# Patient Record
Sex: Female | Born: 1970 | Race: Black or African American | Hispanic: No | Marital: Single | Smoking: Never smoker
Health system: Southern US, Community
[De-identification: ages and names within clinical notes are randomized; demographics above are authoritative.]

## PROBLEM LIST (undated history)

## (undated) DIAGNOSIS — G4730 Sleep apnea, unspecified: Secondary | ICD-10-CM

## (undated) DIAGNOSIS — T7840XA Allergy, unspecified, initial encounter: Secondary | ICD-10-CM

## (undated) DIAGNOSIS — G43909 Migraine, unspecified, not intractable, without status migrainosus: Secondary | ICD-10-CM

## (undated) DIAGNOSIS — R112 Nausea with vomiting, unspecified: Secondary | ICD-10-CM

## (undated) DIAGNOSIS — N289 Disorder of kidney and ureter, unspecified: Secondary | ICD-10-CM

## (undated) DIAGNOSIS — I639 Cerebral infarction, unspecified: Secondary | ICD-10-CM

## (undated) DIAGNOSIS — D649 Anemia, unspecified: Secondary | ICD-10-CM

## (undated) DIAGNOSIS — Z9889 Other specified postprocedural states: Secondary | ICD-10-CM

## (undated) DIAGNOSIS — F419 Anxiety disorder, unspecified: Secondary | ICD-10-CM

## (undated) DIAGNOSIS — I10 Essential (primary) hypertension: Secondary | ICD-10-CM

## (undated) HISTORY — DX: Anxiety disorder, unspecified: F41.9

## (undated) HISTORY — DX: Allergy, unspecified, initial encounter: T78.40XA

## (undated) HISTORY — DX: Cerebral infarction, unspecified: I63.9

## (undated) HISTORY — PX: BREAST REDUCTION SURGERY: SHX8

## (undated) HISTORY — PX: OTHER SURGICAL HISTORY: SHX169

---

## 1996-05-15 HISTORY — PX: REDUCTION MAMMAPLASTY: SUR839

## 2008-01-10 ENCOUNTER — Ambulatory Visit: Payer: Self-pay

## 2008-01-13 ENCOUNTER — Ambulatory Visit: Payer: Self-pay

## 2009-06-09 ENCOUNTER — Emergency Department: Payer: Self-pay | Admitting: Emergency Medicine

## 2011-07-18 ENCOUNTER — Ambulatory Visit: Payer: Self-pay | Admitting: Family Medicine

## 2012-05-24 ENCOUNTER — Ambulatory Visit: Payer: Self-pay | Admitting: Family Medicine

## 2013-01-17 ENCOUNTER — Ambulatory Visit: Payer: Self-pay | Admitting: Family Medicine

## 2014-02-06 LAB — LIPID PANEL
CHOLESTEROL: 149 mg/dL (ref 0–200)
HDL: 40 mg/dL (ref 35–70)
LDL CALC: 91 mg/dL
Triglycerides: 92 mg/dL (ref 40–160)

## 2014-02-06 LAB — TSH: TSH: 3 u[IU]/mL (ref <=5.90)

## 2014-03-06 ENCOUNTER — Emergency Department (HOSPITAL_COMMUNITY): Payer: BC Managed Care – PPO

## 2014-03-06 ENCOUNTER — Encounter (HOSPITAL_COMMUNITY): Payer: Self-pay | Admitting: Emergency Medicine

## 2014-03-06 ENCOUNTER — Emergency Department (HOSPITAL_COMMUNITY)
Admission: EM | Admit: 2014-03-06 | Discharge: 2014-03-06 | Disposition: A | Discharge status: Home / Self Care | Payer: BC Managed Care – PPO | Attending: Emergency Medicine | Admitting: Emergency Medicine

## 2014-03-06 DIAGNOSIS — Z3202 Encounter for pregnancy test, result negative: Secondary | ICD-10-CM | POA: Insufficient documentation

## 2014-03-06 DIAGNOSIS — R1011 Right upper quadrant pain: Secondary | ICD-10-CM | POA: Insufficient documentation

## 2014-03-06 DIAGNOSIS — I10 Essential (primary) hypertension: Secondary | ICD-10-CM | POA: Insufficient documentation

## 2014-03-06 DIAGNOSIS — G8918 Other acute postprocedural pain: Secondary | ICD-10-CM | POA: Insufficient documentation

## 2014-03-06 DIAGNOSIS — M549 Dorsalgia, unspecified: Secondary | ICD-10-CM | POA: Insufficient documentation

## 2014-03-06 DIAGNOSIS — R109 Unspecified abdominal pain: Secondary | ICD-10-CM

## 2014-03-06 DIAGNOSIS — Z87448 Personal history of other diseases of urinary system: Secondary | ICD-10-CM | POA: Insufficient documentation

## 2014-03-06 HISTORY — DX: Migraine, unspecified, not intractable, without status migrainosus: G43.909

## 2014-03-06 HISTORY — DX: Disorder of kidney and ureter, unspecified: N28.9

## 2014-03-06 HISTORY — DX: Essential (primary) hypertension: I10

## 2014-03-06 LAB — URINALYSIS, ROUTINE W REFLEX MICROSCOPIC
Bilirubin Urine: NEGATIVE
GLUCOSE, UA: NEGATIVE mg/dL
Ketones, ur: NEGATIVE mg/dL
LEUKOCYTES UA: NEGATIVE
Nitrite: NEGATIVE
Protein, ur: >300 mg/dL — AB
Specific Gravity, Urine: 1.012 (ref 1.005–1.030)
Urobilinogen, UA: 0.2 mg/dL (ref 0.0–1.0)
pH: 5.5 (ref 5.0–8.0)

## 2014-03-06 LAB — COMPREHENSIVE METABOLIC PANEL
ALT: 8 U/L (ref 0–35)
ANION GAP: 14 (ref 5–15)
AST: 13 U/L (ref 0–37)
Albumin: 2.6 g/dL — ABNORMAL LOW (ref 3.5–5.2)
Alkaline Phosphatase: 112 U/L (ref 39–117)
BUN: 45 mg/dL — AB (ref 6–23)
CALCIUM: 8.6 mg/dL (ref 8.4–10.5)
CO2: 20 mEq/L (ref 19–32)
CREATININE: 4.35 mg/dL — AB (ref 0.50–1.10)
Chloride: 103 mEq/L (ref 96–112)
GFR, EST AFRICAN AMERICAN: 13 mL/min — AB (ref >90)
GFR, EST NON AFRICAN AMERICAN: 12 mL/min — AB (ref >90)
GLUCOSE: 96 mg/dL (ref 70–99)
Potassium: 3.9 mEq/L (ref 3.7–5.3)
Sodium: 137 mEq/L (ref 137–147)
TOTAL PROTEIN: 7.5 g/dL (ref 6.0–8.3)
Total Bilirubin: <0.2 mg/dL — ABNORMAL LOW (ref 0.3–1.2)

## 2014-03-06 LAB — I-STAT CG4 LACTIC ACID, ED: Lactic Acid, Venous: 0.32 mmol/L — ABNORMAL LOW (ref 0.5–2.2)

## 2014-03-06 LAB — CBC WITH DIFFERENTIAL/PLATELET
Basophils Absolute: 0 10*3/uL (ref 0.0–0.1)
Basophils Relative: 0 % (ref 0–1)
EOS ABS: 0.4 10*3/uL (ref 0.0–0.7)
Eosinophils Relative: 4 % (ref 0–5)
HCT: 23.6 % — ABNORMAL LOW (ref 36.0–46.0)
HEMOGLOBIN: 8 g/dL — AB (ref 12.0–15.0)
LYMPHS ABS: 2.3 10*3/uL (ref 0.7–4.0)
Lymphocytes Relative: 24 % (ref 12–46)
MCH: 32.8 pg (ref 26.0–34.0)
MCHC: 33.9 g/dL (ref 30.0–36.0)
MCV: 96.7 fL (ref 78.0–100.0)
MONO ABS: 0.4 10*3/uL (ref 0.1–1.0)
MONOS PCT: 4 % (ref 3–12)
Neutro Abs: 6.7 10*3/uL (ref 1.7–7.7)
Neutrophils Relative %: 68 % (ref 43–77)
PLATELETS: 364 10*3/uL (ref 150–400)
RBC: 2.44 MIL/uL — ABNORMAL LOW (ref 3.87–5.11)
RDW: 11.5 % (ref 11.5–15.5)
WBC: 9.8 10*3/uL (ref 4.0–10.5)

## 2014-03-06 LAB — I-STAT CHEM 8, ED
BUN: 42 mg/dL — AB (ref 6–23)
Calcium, Ion: 1.06 mmol/L — ABNORMAL LOW (ref 1.12–1.23)
Chloride: 107 mEq/L (ref 96–112)
Creatinine, Ser: 4.5 mg/dL — ABNORMAL HIGH (ref 0.50–1.10)
Glucose, Bld: 99 mg/dL (ref 70–99)
HEMATOCRIT: 27 % — AB (ref 36.0–46.0)
Hemoglobin: 9.2 g/dL — ABNORMAL LOW (ref 12.0–15.0)
Potassium: 3.7 mEq/L (ref 3.7–5.3)
Sodium: 137 mEq/L (ref 137–147)
TCO2: 18 mmol/L (ref 0–100)

## 2014-03-06 LAB — PREGNANCY, URINE: Preg Test, Ur: NEGATIVE

## 2014-03-06 LAB — LIPASE, BLOOD: Lipase: 29 U/L (ref 11–59)

## 2014-03-06 LAB — URINE MICROSCOPIC-ADD ON

## 2014-03-06 MED ORDER — TRAMADOL HCL 50 MG PO TABS: 50.0000 mg | ORAL_TABLET | Freq: Four times a day (QID) | ORAL | Status: DC | PRN

## 2014-03-06 MED ORDER — HYDROMORPHONE HCL 1 MG/ML IJ SOLN
0.5000 mg | Freq: Once | INTRAMUSCULAR | Status: AC
  Administered 2014-03-06: 0.5 mg via INTRAVENOUS
  Filled 2014-03-06: qty 1

## 2014-03-06 MED ORDER — VALACYCLOVIR HCL 500 MG PO TABS: 500.0000 mg | ORAL_TABLET | Freq: Two times a day (BID) | ORAL | Status: DC

## 2014-03-06 NOTE — ED Notes (Signed)
Pt reports having right kidney biopsy on 02/17/14 and has had right flank pain since procedure. States pain is a sharp, jabbing, spasm pain that has now radiated to her abdomen. States pain is 4 out 10 when moving, 6 out 10 when twisting and on inspiration, and 8/10 when she wakes up in the am. Pt denies recent injury, twisting, pulling side.

## 2014-03-06 NOTE — ED Provider Notes (Signed)
CSN: [id number]     Arrival date & time 03/06/14  1804 History   First MD Initiated Contact with Patient 03/06/14 1840     Chief Complaint  Patient presents with  . Flank Pain    Patient is a 43 y.o. female presenting with abdominal pain. The history is provided by the patient.  Abdominal Pain Pain location:  L flank Pain quality: aching, sharp and squeezing   Pain radiates to:  RUQ Pain severity:  Severe Onset quality:  Gradual Duration:  2 weeks Timing:  Constant Progression:  Worsening Chronicity:  New Context: previous surgery (recent renal biopsy)   Relieved by:  Nothing Worsened by:  Movement Ineffective treatments: narcotics. Associated symptoms: nausea   Associated symptoms: no chest pain, no cough, no diarrhea, no dysuria, no fever, no vaginal bleeding, no vaginal discharge and no vomiting   Nausea:    Severity:  Mild   Onset quality:  Gradual   Duration:  2 weeks   Timing:  Intermittent   Progression:  Waxing and waning Risk factors: multiple surgeries and obesity     Past Medical History  Diagnosis Date  . Migraine   . Hypertension   . Renal disorder    History reviewed. No pertinent past surgical history. History reviewed. No pertinent family history. History  Substance Use Topics  . Smoking status: Never Smoker   . Smokeless tobacco: Not on file  . Alcohol Use: Not on file   OB History   Grav Para Term Preterm Abortions TAB SAB Ect Mult Living                 Review of Systems  Constitutional: Negative for fever.  Respiratory: Negative for cough.   Cardiovascular: Negative for chest pain.  Gastrointestinal: Positive for nausea and abdominal pain. Negative for vomiting and diarrhea.  Genitourinary: Positive for flank pain. Negative for dysuria, vaginal bleeding, vaginal discharge and difficulty urinating.  Musculoskeletal: Positive for back pain.  Skin: Negative for rash.  All other systems reviewed and are negative.     Allergies  Dye  fdc red; Nsaids; and Ace inhibitors  Home Medications   Prior to Admission medications   Medication Sig Start Date End Date Taking? Authorizing Provider  oxyCODONE-acetaminophen (PERCOCET/ROXICET) 5-325 MG per tablet Take 1 tablet by mouth every 6 (six) hours as needed. 02/17/14   Historical Provider, MD   BP 139/90  Pulse 87  Temp(Src) 97.5 F (36.4 C) (Oral)  Resp 18  Wt 198 lb 2 oz (89.869 kg)  SpO2 100%  LMP 02/17/2014 Physical Exam  Constitutional: She is oriented to person, place, and time. She appears well-developed and well-nourished. She is cooperative. No distress.  Has pain in her right flank with movement  HENT:  Head: Normocephalic and atraumatic.  Right Ear: External ear normal.  Left Ear: External ear normal.  Neck: Normal range of motion and phonation normal.  Cardiovascular: Normal rate and regular rhythm.   Pulmonary/Chest: Effort normal and breath sounds normal. No respiratory distress. She has no wheezes. She has no rales.  Abdominal: Soft. She exhibits no distension. There is no tenderness. There is no rebound and no guarding.  Neurological: She is alert and oriented to person, place, and time. GCS eye subscore is 4. GCS verbal subscore is 5. GCS motor subscore is 6.  Skin: Skin is warm and dry. No rash noted. She is not diaphoretic.  Psychiatric: She has a normal mood and affect. Her speech is normal.    ED  Course  Procedures (including critical care time) Labs Review  Results for orders placed during the hospital encounter of 03/06/14  URINALYSIS, ROUTINE W REFLEX MICROSCOPIC      Result Value Ref Range   Color, Urine YELLOW  YELLOW   APPearance CLEAR  CLEAR   Specific Gravity, Urine 1.012  1.005 - 1.030   pH 5.5  5.0 - 8.0   Glucose, UA NEGATIVE  NEGATIVE mg/dL   Hgb urine dipstick TRACE (*) NEGATIVE   Bilirubin Urine NEGATIVE  NEGATIVE   Ketones, ur NEGATIVE  NEGATIVE mg/dL   Protein, ur >161 (*) NEGATIVE mg/dL   Urobilinogen, UA 0.2  0.0 - 1.0  mg/dL   Nitrite NEGATIVE  NEGATIVE   Leukocytes, UA NEGATIVE  NEGATIVE  PREGNANCY, URINE      Result Value Ref Range   Preg Test, Ur NEGATIVE  NEGATIVE  LIPASE, BLOOD      Result Value Ref Range   Lipase 29  11 - 59 U/L  CBC WITH DIFFERENTIAL      Result Value Ref Range   WBC 9.8  4.0 - 10.5 K/uL   RBC 2.44 (*) 3.87 - 5.11 MIL/uL   Hemoglobin 8.0 (*) 12.0 - 15.0 g/dL   HCT 09.6 (*) 04.5 - 40.9 %   MCV 96.7  78.0 - 100.0 fL   MCH 32.8  26.0 - 34.0 pg   MCHC 33.9  30.0 - 36.0 g/dL   RDW 81.1  91.4 - 78.2 %   Platelets 364  150 - 400 K/uL   Neutrophils Relative % 68  43 - 77 %   Neutro Abs 6.7  1.7 - 7.7 K/uL   Lymphocytes Relative 24  12 - 46 %   Lymphs Abs 2.3  0.7 - 4.0 K/uL   Monocytes Relative 4  3 - 12 %   Monocytes Absolute 0.4  0.1 - 1.0 K/uL   Eosinophils Relative 4  0 - 5 %   Eosinophils Absolute 0.4  0.0 - 0.7 K/uL   Basophils Relative 0  0 - 1 %   Basophils Absolute 0.0  0.0 - 0.1 K/uL  COMPREHENSIVE METABOLIC PANEL      Result Value Ref Range   Sodium 137  137 - 147 mEq/L   Potassium 3.9  3.7 - 5.3 mEq/L   Chloride 103  96 - 112 mEq/L   CO2 20  19 - 32 mEq/L   Glucose, Bld 96  70 - 99 mg/dL   BUN 45 (*) 6 - 23 mg/dL   Creatinine, Ser 9.56 (*) 0.50 - 1.10 mg/dL   Calcium 8.6  8.4 - 21.3 mg/dL   Total Protein 7.5  6.0 - 8.3 g/dL   Albumin 2.6 (*) 3.5 - 5.2 g/dL   AST 13  0 - 37 U/L   ALT 8  0 - 35 U/L   Alkaline Phosphatase 112  39 - 117 U/L   Total Bilirubin <0.2 (*) 0.3 - 1.2 mg/dL   GFR calc non Af Amer 12 (*) >90 mL/min   GFR calc Af Amer 13 (*) >90 mL/min   Anion gap 14  5 - 15  URINE MICROSCOPIC-ADD ON      Result Value Ref Range   Squamous Epithelial / LPF FEW (*) RARE   WBC, UA 3-6  <3 WBC/hpf   RBC / HPF 0-2  <3 RBC/hpf   Bacteria, UA FEW (*) RARE  I-STAT CHEM 8, ED      Result Value Ref Range  Sodium 137  137 - 147 mEq/L   Potassium 3.7  3.7 - 5.3 mEq/L   Chloride 107  96 - 112 mEq/L   BUN 42 (*) 6 - 23 mg/dL   Creatinine, Ser 4.094.50 (*)  0.50 - 1.10 mg/dL   Glucose, Bld 99  70 - 99 mg/dL   Calcium, Ion 8.111.06 (*) 1.12 - 1.23 mmol/L   TCO2 18  0 - 100 mmol/L   Hemoglobin 9.2 (*) 12.0 - 15.0 g/dL   HCT 91.427.0 (*) 78.236.0 - 95.646.0 %  I-STAT CG4 LACTIC ACID, ED      Result Value Ref Range   Lactic Acid, Venous 0.32 (*) 0.5 - 2.2 mmol/L     Imaging Review Ct Abdomen Pelvis Wo Contrast  03/06/2014   CLINICAL DATA:  43 year old female with history of right renal biopsy on 02/17/2014 complaining of right-sided abdominal pain and nausea since that time.  EXAM: CT ABDOMEN AND PELVIS WITHOUT CONTRAST  TECHNIQUE: Multidetector CT imaging of the abdomen and pelvis was performed following the standard protocol without IV contrast.  COMPARISON:  No priors.  FINDINGS: Lower chest: Small right pleural effusion layering dependently is simple in appearance. Minimal dependent atelectasis in the right lower lobe.  Hepatobiliary: Small area of low attenuation adjacent to the inferior aspect of falciform ligament, compatible with focal fatty infiltration. No other focal hepatic lesions are noted. Gallbladder is nearly completely contracted, but otherwise unremarkable in appearance.  Pancreas: Unremarkable.  Spleen: Unremarkable.  Adrenals/Urinary Tract: There is irregularity and intermediate attenuation soft tissue associated with the posterior aspect of the may an upper right kidney, as well as in the posterolateral aspect of the right retroperitoneum, presumably resolving post biopsy fluid collections. This is not frankly bloody at this time, and does not appear to represent a significant volume of subcapsular hemorrhage. Left kidney is unremarkable. No hydroureteronephrosis to suggest urinary tract obstruction at this time. No abnormal urinary tract calculi. Urinary bladder is unremarkable in appearance.  Stomach/Bowel: Normal appearance of the stomach. No pathologic dilatation of the small bowel or colon. Normal appendix.  Vascular/Lymphatic: No significant  atherosclerotic calcifications within the abdominal and pelvic vasculature. No aneurysm. Multiple prominent borderline enlarged retroperitoneal lymph nodes are noted, but are nonspecific. No definite pathologically enlarged lymph nodes are noted in the abdomen or pelvis on today's non contrast CT examination.  Reproductive: Uterus and ovaries are unremarkable in appearance.  Other: Trace volume of free fluid in the cul-de-sac, likely physiologic in this young female patient. No pneumoperitoneum. Tiny umbilical hernia containing only omental fat incidentally noted.  Musculoskeletal: There are no aggressive appearing lytic or blastic lesions noted in the visualized portions of the skeleton.  IMPRESSION: 1. There is mild irregularity of the posterior aspect of the right kidney and in the overlying retroperitoneal soft tissues, presumably resolving post biopsy fluid collections. This is not frankly hemorrhagic at this time, and there does not appear to be a significant sized subcapsular hematoma at this time on today's noncontrast CT examination. 2. There is a small right pleural effusion which is simple in appearance layering dependently with a small amount of dependent atelectasis in the right lower lobe. 3. Normal appendix. 4. Additional incidental findings, as above.   Electronically Signed   By: Trudie Reedaniel  Entrikin M.D.   On: 03/06/2014 20:10   Dg Chest 2 View  03/06/2014   CLINICAL DATA:  Right kidney biopsy 02/17/2014. Right posterior side pain for 8 9 days. History of hypertension.  EXAM: CHEST  2 VIEW  COMPARISON:  None.  FINDINGS: The heart size and mediastinal contours are within normal limits. Both lungs are clear. The visualized skeletal structures are unremarkable.  IMPRESSION: No active cardiopulmonary disease.   Electronically Signed   By: Charlett Nose M.D.   On: 03/06/2014 19:57     MDM   Final diagnoses:  Right flank pain  Right sided abdominal pain    43 year old female who presents to the  ED with right flank pain. She had a renal biopsy about 2 weeks ago and has pain since then. She feels she is getting progressively worse. Associated with nausea. No fever, chills, chest pain, difficulty breathing, dysuria, change in urination, vaginal bleeding, vaginal discharge.   Exam as above. Has pain with movement of her torso. Pain is not reproducible.   Concern for post biopsy hematoma, infection, or other complication. Considered pulmonary source of her pain due to the location of right flank. She denies any shortness of breath, however does have right flank pain that is worse when she takes a deep breath.  Labs were obtained she has a lactate that is not elevated. CBC shows a hemoglobin of 8. The patient reports that she is to start erythropoietin injections this week due to chronic anemia secondary to her chronic kidney disease. CMP notable for an elevated BUN and creatinine consistent with her chronic kidney disease. Lipase within normal limits. Urinalysis shows no sign of infection. UPT negative.  CT abdomen and pelvis without contrast showed mild irregularity of the posterior aspect of the right kidney in the overlying retroperitoneal soft tissues presumably resolving post biopsy fluid collections. No frank hemorrhage and no significant subcapsular hematoma. Small right pleural effusion.  The attending physician discussed at length with the patient the possibility of doing a VQ scan as an outpatient. Her right flank pain is very atypical for a pulmonary embolus as she has pain with movement. She was given strict return precautions with an emphasis on signs and symptoms of pulmonary embolus. She was given return precautions and writing as well. Based on shared decision-making with the patient, she was discharged home and will return immediately should she develop any new or worsening symptoms. She is to followup with her primary care physician.  This case managed in conjunction with my  attending, Dr. Rhunette Croft.   Maxine Glenn, MD 03/07/14 [phone]

## 2014-03-06 NOTE — ED Notes (Signed)
Pt in c/o right flank pain for the last two weeks, on 10/6 pt had a biopsy on her right kidney and symptoms started a few days- a week after that, pain has been getting progressively worse, also nausea

## 2014-03-06 NOTE — Discharge Instructions (Signed)
Abdominal Pain, Women °Abdominal (stomach, pelvic, or belly) pain can be caused by many things. It is important to tell your doctor: °· The location of the pain. °· Does it come and go or is it present all the time? °· Are there things that start the pain (eating certain foods, exercise)? °· Are there other symptoms associated with the pain (fever, nausea, vomiting, diarrhea)? °All of this is helpful to know when trying to find the cause of the pain. °CAUSES  °· Stomach: virus or bacteria infection, or ulcer. °· Intestine: appendicitis (inflamed appendix), regional ileitis (Crohn's disease), ulcerative colitis (inflamed colon), irritable bowel syndrome, diverticulitis (inflamed diverticulum of the colon), or cancer of the stomach or intestine. °· Gallbladder disease or stones in the gallbladder. °· Kidney disease, kidney stones, or infection. °· Pancreas infection or cancer. °· Fibromyalgia (pain disorder). °· Diseases of the female organs: °¨ Uterus: fibroid (non-cancerous) tumors or infection. °¨ Fallopian tubes: infection or tubal pregnancy. °¨ Ovary: cysts or tumors. °¨ Pelvic adhesions (scar tissue). °¨ Endometriosis (uterus lining tissue growing in the pelvis and on the pelvic organs). °¨ Pelvic congestion syndrome (female organs filling up with blood just before the menstrual period). °¨ Pain with the menstrual period. °¨ Pain with ovulation (producing an egg). °¨ Pain with an IUD (intrauterine device, birth control) in the uterus. °¨ Cancer of the female organs. °· Functional pain (pain not caused by a disease, may improve without treatment). °· Psychological pain. °· Depression. °DIAGNOSIS  °Your doctor will decide the seriousness of your pain by doing an examination. °· Blood tests. °· X-rays. °· Ultrasound. °· CT scan (computed tomography, special type of X-ray). °· MRI (magnetic resonance imaging). °· Cultures, for infection. °· Barium enema (dye inserted in the large intestine, to better view it with  X-rays). °· Colonoscopy (looking in intestine with a lighted tube). °· Laparoscopy (minor surgery, looking in abdomen with a lighted tube). °· Major abdominal exploratory surgery (looking in abdomen with a large incision). °TREATMENT  °The treatment will depend on the cause of the pain.  °· Many cases can be observed and treated at home. °· Over-the-counter medicines recommended by your caregiver. °· Prescription medicine. °· Antibiotics, for infection. °· Birth control pills, for painful periods or for ovulation pain. °· Hormone treatment, for endometriosis. °· Nerve blocking injections. °· Physical therapy. °· Antidepressants. °· Counseling with a psychologist or psychiatrist. °· Minor or major surgery. °HOME CARE INSTRUCTIONS  °· Do not take laxatives, unless directed by your caregiver. °· Take over-the-counter pain medicine only if ordered by your caregiver. Do not take aspirin because it can cause an upset stomach or bleeding. °· Try a clear liquid diet (broth or water) as ordered by your caregiver. Slowly move to a bland diet, as tolerated, if the pain is related to the stomach or intestine. °· Have a thermometer and take your temperature several times a day, and record it. °· Bed rest and sleep, if it helps the pain. °· Avoid sexual intercourse, if it causes pain. °· Avoid stressful situations. °· Keep your follow-up appointments and tests, as your caregiver orders. °· If the pain does not go away with medicine or surgery, you may try: °¨ Acupuncture. °¨ Relaxation exercises (yoga, meditation). °¨ Group therapy. °¨ Counseling. °SEEK MEDICAL CARE IF:  °· You notice certain foods cause stomach pain. °· Your home care treatment is not helping your pain. °· You need stronger pain medicine. °· You want your IUD removed. °· You feel faint or   lightheaded.  You develop nausea and vomiting.  You develop a rash.  You are having side effects or an allergy to your medicine. SEEK IMMEDIATE MEDICAL CARE IF:   Your  pain does not go away or gets worse.  You have a fever.  Your pain is felt only in portions of the abdomen. The right side could possibly be appendicitis. The left lower portion of the abdomen could be colitis or diverticulitis.  You are passing blood in your stools (bright red or black tarry stools, with or without vomiting).  You have blood in your urine.  You develop chills, with or without a fever.  You pass out. MAKE SURE YOU:   Understand these instructions.  Will watch your condition.  Will get help right away if you are not doing well or get worse. Document Released: 02/26/2007 Document Revised: 09/15/2013 Document Reviewed: 03/18/2009 [facility] Patient Information 2015 [place], [place]. This information is not intended to replace advice given to you by your health care provider. Make sure you discuss any questions you have with your health care provider.  Pulmonary Embolism A pulmonary (lung) embolism (PE) is a blood clot that has traveled to the lung and results in a blockage of blood flow in the affected lung. Most clots come from deep veins in the legs or pelvis. PE is a dangerous and potentially life-threatening condition that can be treated if identified. CAUSES Blood clots form in a vein for different reasons. Usually several things cause blood clots. They include:  The flow of blood slows down.  The inside of the vein is damaged in some way.  The person has a condition that makes the blood clot more easily. RISK FACTORS Some people are more likely than others to develop PE. Risk factors include:   Smoking.  Being overweight (obese).  Sitting or lying still for a long time. This includes long-distance travel, paralysis, or recovery from an illness or surgery. Other factors that increase risk are:   Older age, especially over 42 years of age.  Having a family history of blood clots or if you have already had a blood clot.  Having major or lengthy surgery.  This is especially true for surgery on the hip, knee, or belly (abdomen). Hip surgery is particularly high risk.  Having a long, thin tube (catheter) placed inside a vein during a medical procedure.  Breaking a hip or leg.  Having cancer or cancer treatment.  Medicines containing the female hormone estrogen. This includes birth control pills and hormone replacement therapy.  Other circulation or heart problems.  Pregnancy and childbirth.  Hormone changes make the blood clot more easily during pregnancy.  The fetus puts pressure on the veins of the pelvis.  There is a risk of injury to veins during delivery or a caesarean delivery. The risk is highest just after childbirth.  PREVENTION   Exercise the legs regularly. Take a brisk 30 minute walk every day.  Maintain a weight that is appropriate for your height.  Avoid sitting or lying in bed for long periods of time without moving your legs.  Women, particularly those over the age of 35 years, should consider the risks and benefits of taking estrogen medicines, including birth control pills.  Do not smoke, especially if you take estrogen medicines.  Long-distance travel can increase your risk. You should exercise your legs by walking or pumping the muscles every hour.  Many of the risk factors above relate to situations that exist with hospitalization, either  for illness, injury, or elective surgery. Prevention may include medical and nonmedical measures.   Your health care provider will assess you for the need for venous thromboembolism prevention when you are admitted to the hospital. If you are having surgery, your surgeon will assess you the day of or day after surgery.  SYMPTOMS  The symptoms of a PE usually start suddenly and include:  Shortness of breath.  Coughing.  Coughing up blood or blood-tinged mucus.  Chest pain. Pain is often worse with deep breaths.  Rapid heartbeat. DIAGNOSIS  If a PE is suspected,  your health care provider will take a medical history and perform a physical exam. Other tests that may be required include:  Blood tests, such as studies of the clotting properties of your blood.  Imaging tests, such as ultrasound, CT, MRI, and other tests to see if you have clots in your legs or lungs.  An electrocardiogram. This can look for heart strain from blood clots in the lungs. TREATMENT   The most common treatment for a PE is blood thinning (anticoagulant) medicine, which reduces the blood's tendency to clot. Anticoagulants can stop new blood clots from forming and old clots from growing. They cannot dissolve existing clots. Your body does this by itself over time. Anticoagulants can be given by mouth, through an intravenous (IV) tube, or by injection. Your health care provider will determine the best program for you.  Less commonly, clot-dissolving medicines (thrombolytics) are used to dissolve a PE. They carry a high risk of bleeding, so they are used mainly in severe cases.  Very rarely, a blood clot in the leg needs to be removed surgically.  If you are unable to take anticoagulants, your health care provider may arrange for you to have a filter placed in a main vein in your abdomen. This filter prevents clots from traveling to your lungs. HOME CARE INSTRUCTIONS   Take all medicines as directed by your health care provider.  Learn as much as you can about DVT.  Wear a medical alert bracelet or carry a medical alert card.  Ask your health care provider how soon you can go back to normal activities. It is important to stay active to prevent blood clots. If you are on anticoagulant medicine, avoid contact sports.  It is very important to exercise. This is especially important while traveling, sitting, or standing for long periods of time. Exercise your legs by walking or by tightening and relaxing your leg muscles regularly. Take frequent walks.  You may need to wear  compression stockings. These are tight elastic stockings that apply pressure to the lower legs. This pressure can help keep the blood in the legs from clotting. Taking Warfarin Warfarin is a daily medicine that is taken by mouth. Your health care provider will advise you on the length of treatment (usually 3-6 months, sometimes lifelong). If you take warfarin:  Understand how to take warfarin and foods that can affect how warfarin works in Public relations account executive.  Too much and too little warfarin are both dangerous. Too much warfarin increases the risk of bleeding. Too little warfarin continues to allow the risk for blood clots. Warfarin and Regular Blood Testing While taking warfarin, you will need to have regular blood tests to measure your blood clotting time. These blood tests usually include both the prothrombin time (PT) and international normalized ratio (INR) tests. The PT and INR results allow your health care provider to adjust your dose of warfarin. It is very important  that you have your PT and INR tested as often as directed by your health care provider.  Warfarin and Your Diet Avoid major changes in your diet, or notify your health care provider before changing your diet. Arrange a visit with a registered dietitian to answer your questions. Many foods, especially foods high in vitamin K, can interfere with warfarin and affect the PT and INR results. You should eat a consistent amount of foods high in vitamin K. Foods high in vitamin K include:   Spinach, kale, broccoli, cabbage, collard and turnip greens, Brussels sprouts, peas, cauliflower, seaweed, and parsley.  Beef and pork liver.  Green tea.  Soybean oil. Warfarin with Other Medicines Many medicines can interfere with warfarin and affect the PT and INR results. You must:  Tell your health care provider about any and all medicines, vitamins, and supplements you take, including aspirin and other over-the-counter anti-inflammatory medicines.  Be especially cautious with aspirin and anti-inflammatory medicines. Ask your health care provider before taking these.  Do not take or discontinue any prescribed or over-the-counter medicine except on the advice of your health care provider or pharmacist. Warfarin Side Effects Warfarin can have side effects, such as easy bruising and difficulty stopping bleeding. Ask your health care provider or pharmacist about other side effects of warfarin. You will need to:  Hold pressure over cuts for longer than usual.  Notify your dentist and other health care providers that you are taking warfarin before you undergo any procedures where bleeding may occur. Warfarin with Alcohol and Tobacco   Drinking alcohol frequently can increase the effect of warfarin, leading to excess bleeding. It is best to avoid alcoholic drinks or consume only very small amounts while taking warfarin. Notify your health care provider if you change your alcohol intake.  Do not use any tobacco products including cigarettes, chewing tobacco, or electronic cigarettes. If you smoke, quit. Ask your health care provider for help with quitting smoking. Alternative Medicines to Warfarin: Factor Xa Inhibitor Medicines  These blood thinning medicines are taken by mouth, usually for several weeks or longer. It is important to take the medicine every single day, at the same time each day.  There are no regular blood tests required when using these medicines.  There are fewer food and drug interactions than with warfarin.  The side effects of this class of medicine is similar to that of warfarin, including excessive bruising or bleeding. Ask your health care provider or pharmacist about other potential side effects. SEEK MEDICAL CARE IF:   You notice a rapid heartbeat.  You feel weaker or more tired than usual.  You feel faint.  You notice increased bruising.  Your symptoms are not getting better in the time expected.  You are  having side effects of medicine. SEEK IMMEDIATE MEDICAL CARE IF:   You have chest pain.  You have trouble breathing.  You have new or increased swelling or pain in one leg.  You cough up blood.  You notice blood in vomit, in a bowel movement, or in urine.  You have a fever. Symptoms of PE may represent a serious problem that is an emergency. Do not wait to see if the symptoms will go away. Get medical help right away. Call your local emergency services (911 in the [place] Do not drive yourself to the hospital. Document Released: 04/28/2000 Document Revised: 09/15/2013 Document Reviewed: 05/12/2013 [facility] Patient Information 2015 [place], [place]. This information is not intended to replace advice given to you  by your health care provider. Make sure you discuss any questions you have with your health care provider. ° °

## 2014-03-06 NOTE — ED Provider Notes (Signed)
I performed a history and physical examination of  Maryana [name] and discussed her management with Dr. Melene MullerBatista. I agree with the history, physical, assessment, and plan of care, with the following exceptions: None I was present for the following procedures: None  Time Spent in Critical Care of the patient: None  Time spent in discussions with the patient and family: 10 min  Henrik Orihuela  Pt comes in with  Flank pain, R. S/p biopsy on 10/6. Pt's pain is worse with inspiration. No UTI like sx, no hematuria. Clinical concerns for renal hematoma, PNA, and PE is possible, but less likely. Will get CT abd non contrast (as she has CKD), and CXR to start.   1:54 AM PE discussion done. Dimer not a good test du to recent surgery. Return precautions discussed with pt, and she rather go with the wait and see approach over VQ scan.  Derwood KaplanAnkit Ausencio Vaden, MD 03/07/14 [phone]

## 2014-03-25 DIAGNOSIS — D631 Anemia in chronic kidney disease: Secondary | ICD-10-CM

## 2014-03-25 DIAGNOSIS — N189 Chronic kidney disease, unspecified: Secondary | ICD-10-CM | POA: Insufficient documentation

## 2014-06-11 LAB — CBC AND DIFFERENTIAL
HCT: 34 % — AB (ref 36–46)
HEMOGLOBIN: 11.8 g/dL — AB (ref 12.0–16.0)
Platelets: 365 10*3/uL (ref 150–399)

## 2014-09-07 ENCOUNTER — Encounter (HOSPITAL_COMMUNITY): Payer: Self-pay | Admitting: Emergency Medicine

## 2014-09-07 ENCOUNTER — Emergency Department (INDEPENDENT_AMBULATORY_CARE_PROVIDER_SITE_OTHER)
Admission: EM | Admit: 2014-09-07 | Discharge: 2014-09-07 | Disposition: A | Discharge status: Home / Self Care | Payer: BC Managed Care – PPO | Source: Home / Self Care | Attending: Family Medicine | Admitting: Family Medicine

## 2014-09-07 DIAGNOSIS — R0789 Other chest pain: Secondary | ICD-10-CM | POA: Diagnosis not present

## 2014-09-07 NOTE — Discharge Instructions (Signed)
Tylenol , ice pack as needed for pain, activity as tolerated.

## 2014-09-07 NOTE — ED Notes (Signed)
Pt states that she started to have chest pain that started today. Pt states that she felt the chest pain

## 2014-09-07 NOTE — ED Provider Notes (Signed)
CSN: [id number]     Arrival date & time 09/07/14  1641 History   First MD Initiated Contact with Patient 09/07/14 1724     Chief Complaint  Patient presents with  . Chest Pain   (Consider location/radiation/quality/duration/timing/severity/associated sxs/prior Treatment) Patient is a 44 y.o. female presenting with chest pain. The history is provided by the patient.  Chest Pain Pain location:  L chest Pain quality: sharp   Pain radiates to:  Does not radiate Pain radiates to the back: no   Pain severity:  Mild Onset quality:  Gradual Duration:  4 hours Chronicity:  New Context: movement   Relieved by:  None tried Worsened by:  Nothing tried Ineffective treatments:  None tried Associated symptoms: no anxiety, no back pain, no fever, no palpitations, no shortness of breath and no weakness   Risk factors: hypertension     Past Medical History  Diagnosis Date  . Migraine   . Hypertension   . Renal disorder    History reviewed. No pertinent past surgical history. History reviewed. No pertinent family history. History  Substance Use Topics  . Smoking status: Never Smoker   . Smokeless tobacco: Not on file  . Alcohol Use: Not on file   OB History    No data available     Review of Systems  Constitutional: Negative.  Negative for fever.  Respiratory: Negative for shortness of breath.   Cardiovascular: Positive for chest pain. Negative for palpitations and leg swelling.  Gastrointestinal: Negative.   Musculoskeletal: Negative for back pain.  Neurological: Negative for weakness.    Allergies  Dye fdc red; Nsaids; and Ace inhibitors  Home Medications   Prior to Admission medications   Medication Sig Start Date End Date Taking? Authorizing Provider  metoprolol succinate (TOPROL-XL) 50 MG 24 hr tablet Take 50 mg by mouth daily. Take with or immediately following a meal.   Yes Historical Provider, MD  oxyCODONE-acetaminophen (PERCOCET/ROXICET) 5-325 MG per tablet Take 1  tablet by mouth every 6 (six) hours as needed. 02/17/14   Historical Provider, MD  traMADol (ULTRAM) 50 MG tablet Take 1 tablet (50 mg total) by mouth every 6 (six) hours as needed for severe pain. 03/06/14   Maxine GlennAnn Batista, MD   BP 154/105 mmHg  Pulse 62  Temp(Src) 99.2 F (37.3 C) (Oral)  Resp 20  SpO2 99%  LMP 08/14/2014 Physical Exam  Constitutional: She is oriented to person, place, and time. She appears well-developed and well-nourished.  Neck: Normal range of motion. Neck supple.  Cardiovascular: Normal rate, regular rhythm, normal heart sounds and intact distal pulses.   Pulmonary/Chest: Effort normal. No respiratory distress. She has no wheezes. She has no rales. She exhibits tenderness.  Abdominal: Soft. Bowel sounds are normal.  Neurological: She is alert and oriented to person, place, and time.  Skin: Skin is warm and dry.  Nursing note and vitals reviewed.   ED Course  Procedures (including critical care time) Labs Review ecg, wnl, nsr Imaging Review No results found.   MDM   1. Acute chest wall pain        Linna HoffJames D Kindl, MD 09/07/14 [phone]

## 2014-09-07 NOTE — ED Notes (Signed)
Pt is experiencing left lower chest pain that can be recreated with pressure.  It started earlier today when she was driving.

## 2014-09-28 LAB — BASIC METABOLIC PANEL
BUN: 57 mg/dL — AB (ref 4–21)
Creatinine: 3.8 mg/dL — AB (ref 0.5–1.1)
GLUCOSE: 93 mg/dL
Potassium: 5 mmol/L (ref 3.4–5.3)
Sodium: 136 mmol/L — AB (ref 137–147)

## 2014-09-28 LAB — HEPATIC FUNCTION PANEL
ALT: 12 U/L (ref 7–35)
AST: 14 U/L (ref 13–35)

## 2015-01-26 ENCOUNTER — Ambulatory Visit (INDEPENDENT_AMBULATORY_CARE_PROVIDER_SITE_OTHER): Payer: BC Managed Care – PPO | Admitting: Family Medicine

## 2015-01-26 ENCOUNTER — Encounter: Payer: Self-pay | Admitting: Family Medicine

## 2015-01-26 VITALS — BP 150/104 | HR 68 | Temp 98.1°F | Resp 16 | Ht 64.0 in | Wt 217.0 lb

## 2015-01-26 DIAGNOSIS — N926 Irregular menstruation, unspecified: Secondary | ICD-10-CM

## 2015-01-26 DIAGNOSIS — R079 Chest pain, unspecified: Secondary | ICD-10-CM | POA: Insufficient documentation

## 2015-01-26 DIAGNOSIS — M79669 Pain in unspecified lower leg: Secondary | ICD-10-CM

## 2015-01-26 DIAGNOSIS — N189 Chronic kidney disease, unspecified: Secondary | ICD-10-CM

## 2015-01-26 DIAGNOSIS — N186 End stage renal disease: Secondary | ICD-10-CM

## 2015-01-26 DIAGNOSIS — D72829 Elevated white blood cell count, unspecified: Secondary | ICD-10-CM

## 2015-01-26 DIAGNOSIS — D631 Anemia in chronic kidney disease: Secondary | ICD-10-CM | POA: Insufficient documentation

## 2015-01-26 DIAGNOSIS — J3089 Other allergic rhinitis: Secondary | ICD-10-CM

## 2015-01-26 DIAGNOSIS — J309 Allergic rhinitis, unspecified: Secondary | ICD-10-CM | POA: Insufficient documentation

## 2015-01-26 DIAGNOSIS — M940 Chondrocostal junction syndrome [Tietze]: Secondary | ICD-10-CM

## 2015-01-26 DIAGNOSIS — M25569 Pain in unspecified knee: Secondary | ICD-10-CM

## 2015-01-26 DIAGNOSIS — N185 Chronic kidney disease, stage 5: Secondary | ICD-10-CM | POA: Diagnosis not present

## 2015-01-26 DIAGNOSIS — R9431 Abnormal electrocardiogram [ECG] [EKG]: Secondary | ICD-10-CM | POA: Insufficient documentation

## 2015-01-26 DIAGNOSIS — R0789 Other chest pain: Secondary | ICD-10-CM

## 2015-01-26 DIAGNOSIS — M545 Low back pain: Secondary | ICD-10-CM

## 2015-01-26 DIAGNOSIS — I120 Hypertensive chronic kidney disease with stage 5 chronic kidney disease or end stage renal disease: Secondary | ICD-10-CM | POA: Insufficient documentation

## 2015-01-26 DIAGNOSIS — M25539 Pain in unspecified wrist: Secondary | ICD-10-CM

## 2015-01-26 DIAGNOSIS — R87612 Low grade squamous intraepithelial lesion on cytologic smear of cervix (LGSIL): Secondary | ICD-10-CM

## 2015-01-26 DIAGNOSIS — R5383 Other fatigue: Secondary | ICD-10-CM

## 2015-01-26 DIAGNOSIS — K429 Umbilical hernia without obstruction or gangrene: Secondary | ICD-10-CM | POA: Insufficient documentation

## 2015-01-26 DIAGNOSIS — I10 Essential (primary) hypertension: Secondary | ICD-10-CM

## 2015-01-26 DIAGNOSIS — G4730 Sleep apnea, unspecified: Secondary | ICD-10-CM

## 2015-01-26 DIAGNOSIS — N924 Excessive bleeding in the premenopausal period: Secondary | ICD-10-CM

## 2015-01-26 DIAGNOSIS — L568 Other specified acute skin changes due to ultraviolet radiation: Secondary | ICD-10-CM

## 2015-01-26 DIAGNOSIS — E559 Vitamin D deficiency, unspecified: Secondary | ICD-10-CM

## 2015-01-26 DIAGNOSIS — M7918 Myalgia, other site: Secondary | ICD-10-CM

## 2015-01-26 DIAGNOSIS — J90 Pleural effusion, not elsewhere classified: Secondary | ICD-10-CM

## 2015-01-26 DIAGNOSIS — M542 Cervicalgia: Secondary | ICD-10-CM

## 2015-01-26 MED ORDER — CYCLOBENZAPRINE HCL 5 MG PO TABS
5.0000 mg | ORAL_TABLET | Freq: Three times a day (TID) | ORAL | Status: DC | PRN
Start: 2015-01-26 — End: 2015-03-26

## 2015-01-26 NOTE — Progress Notes (Signed)
Subjective:    Patient ID: Elizabeth Anthony, female    DOB: 1971/04/12, 44 y.o.   MRN: [id number]  Back Pain This is a new problem. The current episode started in the past 7 days (2 days ago). The problem has been gradually worsening since onset. The pain is present in the gluteal. The quality of the pain is described as shooting. The pain does not radiate. The pain is at a severity of 5/10. The pain is moderate. The symptoms are aggravated by twisting and bending. Associated symptoms include abdominal pain. Pertinent negatives include no bladder incontinence, bowel incontinence, chest pain, dysuria, fever, headaches, leg pain, numbness, pelvic pain, tingling, weakness or weight loss. She has tried nothing (due to pt's CKD) for the symptoms.   Hurts with certain movements, especially on the right.  Comes and goes.  Shooting.   Limiting what she does.  Can not take anti-inflammatories secondary to kidney problems. Ultram makes her sick.  Hydrocodone makes her sleepy. Has some at home.   Is going to put on the transplant list for her CKD stage V.  Does have a lot of tests, etc that she needs done.     Review of Systems  Constitutional: Negative for fever and weight loss.  Cardiovascular: Negative for chest pain.  Gastrointestinal: Positive for abdominal pain. Negative for bowel incontinence.  Genitourinary: Negative for bladder incontinence, dysuria and pelvic pain.  Musculoskeletal: Positive for back pain.  Neurological: Negative for tingling, weakness, numbness and headaches.   BP 150/104 mmHg  Pulse 68  Temp(Src) 98.1 F (36.7 C) (Oral)  Resp 16  Ht '5\' 4"'  (1.626 m)  Wt 217 lb (98.431 kg)  BMI 37.23 kg/m2  LMP 01/19/2015 (Approximate)   Patient Active Problem List   Diagnosis Date Noted  . Abnormal EKG 01/26/2015  . Sleep apnea 01/26/2015  . 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 Umbilical hernia without obstruction or gangrene 01/26/2015  . Cervicalgia 01/26/2015  . Pain in joint,  forearm 01/26/2015  . Papanicolaou smear of cervix with low grade squamous intraepithelial lesion (LGSIL) 01/26/2015  . Premenopausal menorrhagia 01/26/2015  . Irregular menses 01/26/2015  . Essential hypertension, benign 01/26/2015  . 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 Fatigue 01/26/2015  . Skin photosensitivity 01/26/2015  . Rhinitis, allergic 01/26/2015  . Calf pain 01/26/2015  . Piriformis muscle pain 01/26/2015  . Pleural effusion 01/26/2015  . Elevated WBC count 01/26/2015  . Anemia in CKD (chronic kidney disease) 01/26/2015  . Knee pain 01/26/2015  . Vitamin D deficiency 01/26/2015  . Chest pain 01/26/2015  . Anemia associated with chronic renal failure 03/25/2014   Past Medical History  Diagnosis Date  . Migraine   . Hypertension   . Renal disorder   . Allergy   . Anxiety    Current Outpatient Prescriptions on File Prior to Visit  Medication Sig  . metoprolol succinate (TOPROL-XL) 50 MG 24 hr tablet Take 50 mg by mouth daily. Take with or immediately following a meal.   No current facility-administered medications on file prior to visit.   Allergies  Allergen Reactions  . Dye Fdc Red [Red Dye] Other (See Comments)    Kidney failure  . Nsaids     Shoots creatine up  . Ace Inhibitors Other (See Comments)    Uncontrollable Coughing & Choking   Past Surgical History  Procedure Laterality Date  . Breast reduction surgery Bilateral    Social History   Social History  .  Marital Status: Single    Spouse Name: N/A  . Number of Children: N/A  . Years of Education: N/A   Occupational History  . Not on file.   Social History Main Topics  . Smoking status: Never Smoker   . Smokeless tobacco: Never Used  . Alcohol Use: Yes     Comment: occasional  . Drug Use: No  . Sexual Activity: Not on file   Other Topics Concern  . Not on file   Social History Narrative   Family History  Problem Relation Age of Onset  .  Cancer Mother     breast   . Kidney disease Mother         Objective:   Physical Exam  Constitutional: She is oriented to person, place, and time. She appears well-developed and well-nourished.  Pulmonary/Chest: Effort normal and breath sounds normal.  Musculoskeletal: Normal range of motion. She exhibits no edema or tenderness.  Straight leg raises negative.   Neurological: She is alert and oriented to person, place, and time.  Psychiatric: She has a normal mood and affect. Her behavior is normal. Judgment and thought content normal.   BP 150/104 mmHg  Pulse 68  Temp(Src) 98.1 F (36.7 C) (Oral)  Resp 16  Ht '5\' 4"'  (1.626 m)  Wt 217 lb (98.431 kg)  BMI 37.23 kg/m2  LMP 01/19/2015 (Approximate)     Assessment & Plan:  1. Anemia associated with chronic renal failure, stage 5 Will check labs.   2. 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  Is going to be put on transplant list. Medications need to be check to make sure not renally excreted.   Will follow up for CPE to start work  Up, needs TB testing, PAP, mammogram and possibly some other testing.  - CBC with Differential/Platelet - Comprehensive metabolic panel  3. Low back pain without sciatica, unspecified back pain laterality Condition is worsening. Will start medication for better control.  Does not want narcotic, ultram or steroids at this point. Will try muscle relaxer. Patient instructed to call back if condition worsens or does not improve.   Will send in rx for Low dose Flexeril and see if helps.   Margarita Rana, MD

## 2015-01-27 ENCOUNTER — Telehealth: Payer: Self-pay

## 2015-01-27 LAB — CBC WITH DIFFERENTIAL/PLATELET
BASOS: 0 %
Basophils Absolute: 0 10*3/uL (ref 0.0–0.2)
EOS (ABSOLUTE): 0.2 10*3/uL (ref 0.0–0.4)
EOS: 2 %
HEMATOCRIT: 36.4 % (ref 34.0–46.6)
HEMOGLOBIN: 12 g/dL (ref 11.1–15.9)
Immature Grans (Abs): 0 10*3/uL (ref 0.0–0.1)
Immature Granulocytes: 0 %
LYMPHS ABS: 2.8 10*3/uL (ref 0.7–3.1)
Lymphs: 37 %
MCH: 33.5 pg — ABNORMAL HIGH (ref 26.6–33.0)
MCHC: 33 g/dL (ref 31.5–35.7)
MCV: 102 fL — ABNORMAL HIGH (ref 79–97)
Monocytes Absolute: 0.5 10*3/uL (ref 0.1–0.9)
Monocytes: 6 %
Neutrophils Absolute: 4.2 10*3/uL (ref 1.4–7.0)
Neutrophils: 55 %
Platelets: 291 10*3/uL (ref 150–379)
RBC: 3.58 x10E6/uL — ABNORMAL LOW (ref 3.77–5.28)
RDW: 13.6 % (ref 12.3–15.4)
WBC: 7.6 10*3/uL (ref 3.4–10.8)

## 2015-01-27 LAB — COMPREHENSIVE METABOLIC PANEL
ALBUMIN: 3.7 g/dL (ref 3.5–5.5)
ALT: 13 IU/L (ref 0–32)
AST: 16 IU/L (ref 0–40)
Albumin/Globulin Ratio: 0.9 — ABNORMAL LOW (ref 1.1–2.5)
Alkaline Phosphatase: 119 IU/L — ABNORMAL HIGH (ref 39–117)
BUN / CREAT RATIO: 9 (ref 9–23)
BUN: 39 mg/dL — ABNORMAL HIGH (ref 6–24)
CO2: 22 mmol/L (ref 18–29)
CREATININE: 4.22 mg/dL — AB (ref 0.57–1.00)
Calcium: 9.1 mg/dL (ref 8.7–10.2)
Chloride: 99 mmol/L (ref 97–108)
GFR calc Af Amer: 14 mL/min/{1.73_m2} — ABNORMAL LOW (ref >59)
GFR calc non Af Amer: 12 mL/min/{1.73_m2} — ABNORMAL LOW (ref >59)
GLOBULIN, TOTAL: 4.3 g/dL (ref 1.5–4.5)
Glucose: 88 mg/dL (ref 65–99)
Potassium: 5 mmol/L (ref 3.5–5.2)
SODIUM: 136 mmol/L (ref 134–144)
Total Protein: 8 g/dL (ref 6.0–8.5)

## 2015-01-27 NOTE — Telephone Encounter (Signed)
Pt advised.   Thanks,   -Mazi Schuff  

## 2015-01-27 NOTE — Telephone Encounter (Signed)
-----   Message from Lorie Phenix, MD sent at 01/27/2015  9:09 AM EDT ----- Hgb stable at 12.  Creatine 4.22,  With GFR at 14.   PLease notify patient. Thanks.

## 2015-01-27 NOTE — Telephone Encounter (Signed)
LMTCB 01/27/2015  Thanks,   -Sherleen Pangborn  

## 2015-02-09 ENCOUNTER — Encounter: Payer: Self-pay | Admitting: Family Medicine

## 2015-02-09 ENCOUNTER — Ambulatory Visit (INDEPENDENT_AMBULATORY_CARE_PROVIDER_SITE_OTHER): Payer: BC Managed Care – PPO | Admitting: Family Medicine

## 2015-02-09 VITALS — BP 140/104 | HR 84 | Temp 98.4°F | Resp 16 | Ht 64.0 in | Wt 221.0 lb

## 2015-02-09 DIAGNOSIS — Z124 Encounter for screening for malignant neoplasm of cervix: Secondary | ICD-10-CM

## 2015-02-09 DIAGNOSIS — N185 Chronic kidney disease, stage 5: Secondary | ICD-10-CM

## 2015-02-09 DIAGNOSIS — Z111 Encounter for screening for respiratory tuberculosis: Secondary | ICD-10-CM

## 2015-02-09 DIAGNOSIS — Z1239 Encounter for other screening for malignant neoplasm of breast: Secondary | ICD-10-CM | POA: Diagnosis not present

## 2015-02-09 DIAGNOSIS — Z1211 Encounter for screening for malignant neoplasm of colon: Secondary | ICD-10-CM | POA: Diagnosis not present

## 2015-02-09 DIAGNOSIS — N186 End stage renal disease: Secondary | ICD-10-CM

## 2015-02-09 DIAGNOSIS — Z0000 Encounter for general adult medical examination without abnormal findings: Secondary | ICD-10-CM | POA: Diagnosis not present

## 2015-02-09 NOTE — Progress Notes (Signed)
Patient ID: Janeann Forehand, female   DOB: 28-Jul-1970, 44 y.o.   MRN: [id number]        Patient: Elizabeth Anthony, Female    DOB: [birth year], 44 y.o.   MRN: [id number] Visit Date: 02/09/2015  Today's Provider: Margarita Rana, MD   Chief Complaint  Patient presents with  . Annual Exam   Subjective:    Annual physical exam Elizabeth Anthony is a 44 y.o. female who presents today for health maintenance and complete physical. She feels well. She reports exercising starting a circuit training will be going 4-5 days a week. She reports she is sleeping well 6-8 hours of sleep.  Patient reports that her BP has been elevated and that her nephrologist recommended she decrease dose of sodium bicarbonate 650 mg to once a day.  Starting work up for possible kidney transplant.   02/03/14 CPE 11/14/11 EKG  Lab Results  Component Value Date   WBC 7.6 01/26/2015   HGB 11.8* 06/11/2014   HCT 36.4 01/26/2015   PLT 365 06/11/2014   GLUCOSE 88 01/26/2015   CHOL 149 02/06/2014   TRIG 92 02/06/2014   HDL 40 02/06/2014   LDLCALC 91 02/06/2014   ALT 13 01/26/2015   AST 16 01/26/2015   NA 136 01/26/2015   K 5.0 01/26/2015   CL 99 01/26/2015   CREATININE 4.22* 01/26/2015   BUN 39* 01/26/2015   CO2 22 01/26/2015   TSH 3.00 02/06/2014    -----------------------------------------------------------------   Review of Systems  Constitutional: Positive for activity change and unexpected weight change.  HENT: Positive for sinus pressure.   Eyes: Negative.   Respiratory: Negative.   Cardiovascular: Negative.   Gastrointestinal: Negative.   Endocrine: Negative.   Genitourinary: Negative.   Musculoskeletal: Positive for back pain.  Skin: Negative.   Allergic/Immunologic: Negative.   Neurological: Negative.   Hematological: Negative.   Psychiatric/Behavioral: Negative.     Social History She  reports that she has never smoked. She has never used smokeless tobacco. She reports that  she drinks alcohol. She reports that she does not use illicit drugs. Social History   Social History  . Marital Status: Single    Spouse Name: N/A  . Number of Children: N/A  . Years of Education: N/A   Social History Main Topics  . Smoking status: Never Smoker   . Smokeless tobacco: Never Used  . Alcohol Use: Yes     Comment: occasional  . Drug Use: No  . Sexual Activity: Not Asked   Other Topics Concern  . None   Social History Narrative    Patient Active Problem List   Diagnosis Date Noted  . Abnormal EKG 01/26/2015  . Sleep apnea 01/26/2015  . 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 Umbilical hernia without obstruction or gangrene 01/26/2015  . Cervicalgia 01/26/2015  . Pain in joint, forearm 01/26/2015  . Papanicolaou smear of cervix with low grade squamous intraepithelial lesion (LGSIL) 01/26/2015  . Premenopausal menorrhagia 01/26/2015  . Irregular menses 01/26/2015  . Essential hypertension, benign 01/26/2015  . 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 Fatigue 01/26/2015  . Skin photosensitivity 01/26/2015  . Rhinitis, allergic 01/26/2015  . Calf pain 01/26/2015  . Piriformis muscle pain 01/26/2015  . Pleural effusion 01/26/2015  . Elevated WBC count 01/26/2015  . Anemia in CKD (chronic kidney disease) 01/26/2015  . Knee pain 01/26/2015  . Vitamin D deficiency 01/26/2015  . Chest pain 01/26/2015  . Anemia associated with chronic renal  failure 03/25/2014    Past Surgical History  Procedure Laterality Date  . Breast reduction surgery Bilateral     Family History  Family Status  Relation Status Death Age  . Mother Deceased   . Father Deceased   . Sister Alive   . Brother Alive    Her family history includes Cancer in her mother; Kidney disease in her mother.    Allergies  Allergen Reactions  . Dye Fdc Red [Red Dye] Other (See Comments)    Kidney failure  . Nsaids     Shoots creatine up  . Ace Inhibitors Other (See  Comments)    Uncontrollable Coughing & Choking  . Alcohol Itching    Hard liquor (Vodka)    Previous Medications   CYCLOBENZAPRINE (FLEXERIL) 5 MG TABLET    Take 1 tablet (5 mg total) by mouth 3 (three) times daily as needed for muscle spasms.   FLUTICASONE (FLONASE) 50 MCG/ACT NASAL SPRAY    1 spray by Each Nare route Two (2) times a day.   METOPROLOL SUCCINATE (TOPROL-XL) 50 MG 24 HR TABLET    Take 50 mg by mouth daily. Take with or immediately following a meal.   OVER THE COUNTER MEDICATION    Take by mouth.    OVER THE COUNTER MEDICATION    Take by mouth.   OVER THE COUNTER MEDICATION    Take by mouth.   OVER THE COUNTER MEDICATION    Take by mouth.   SODIUM BICARBONATE 650 MG TABLET    TAKE 1 TABLET (650 MG TOTAL) BY MOUTH TWO (2) TIMES A DAY.   VITAMIN D, ERGOCALCIFEROL, (DRISDOL) 50000 UNITS CAPS CAPSULE    Take 1 capsule by mouth daily.    Patient Care Team: Margarita Rana, MD as PCP - General (Family Medicine)     Objective:   Vitals: BP 140/104 mmHg  Pulse 84  Temp(Src) 98.4 F (36.9 C)  Resp 16  Ht _0  (1.626 m)  Wt 221 lb (100.245 kg)  BMI 37.92 kg/m2  SpO2 98%  LMP 01/19/2015 (Approximate)   Physical Exam  Constitutional: She is oriented to person, place, and time. She appears well-developed and well-nourished.  HENT:  Head: Normocephalic and atraumatic.  Right Ear: Tympanic membrane, external ear and ear canal normal.  Left Ear: Tympanic membrane, external ear and ear canal normal.  Nose: Nose normal.  Mouth/Throat: Uvula is midline, oropharynx is clear and moist and mucous membranes are normal.  Eyes: Conjunctivae, EOM and lids are normal. Pupils are equal, round, and reactive to light.  Neck: Trachea normal and normal range of motion. Neck supple. Carotid bruit is not present. No thyroid mass and no thyromegaly present.  Cardiovascular: Normal rate, regular rhythm and normal heart sounds.   Pulmonary/Chest: Effort normal and breath sounds normal.    Abdominal: Soft. Normal appearance and bowel sounds are normal. There is no hepatosplenomegaly. There is no tenderness.  Genitourinary: No breast swelling, tenderness or discharge.  Musculoskeletal: Normal range of motion.  Lymphadenopathy:    She has no cervical adenopathy.    She has no axillary adenopathy.  Neurological: She is alert and oriented to person, place, and time. She has normal strength. No cranial nerve deficit.  Skin: Skin is warm, dry and intact.  Psychiatric: She has a normal mood and affect. Her speech is normal and behavior is normal. Judgment and thought content normal. Cognition and memory are normal.     Depression Screen PHQ 2/9 Scores 02/09/2015  PHQ -  2 Score 1      Assessment & Plan:     Routine Health Maintenance and Physical Exam  Exercise Activities and Dietary recommendations Goals    . Exercise 150 minutes per week (moderate activity)       Immunization History  Administered Date(s) Administered  . Td 06/09/2005    Health Maintenance  Topic Date Due  . HIV Screening  01/06/1987  . PAP SMEAR  01/05/1993  . INFLUENZA VACCINE  12/14/2014  . TETANUS/TDAP  06/10/2015    1. Annual physical exam As above.  - EKG 12-Lead  2. 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  - EKG 12-Lead  3. Breast cancer screening - MM DIGITAL SCREENING BILATERAL; Future  4. Cervical cancer screening - Pap IG and HPV (high risk) DNA detection  5. Colon cancer screening Will refer.   6. Screening examination for pulmonary tuberculosis Will order.  - Quantiferon tb gold assay (blood)   Patient was seen and examined by Jerrell Belfast, MD, and note scribed by Lynford Humphrey, [place].   I have reviewed the document for accuracy and completeness and I agree with above. Jerrell Belfast, MD   Margarita Rana, MD     --------------------------------------------------------------------

## 2015-02-11 LAB — QUANTIFERON IN TUBE
QUANTIFERON MITOGEN VALUE: 8.77 [IU]/mL
QUANTIFERON NIL VALUE: 0.05 [IU]/mL
QUANTIFERON TB AG VALUE: 0.03 [IU]/mL
QUANTIFERON TB GOLD: NEGATIVE

## 2015-02-11 LAB — QUANTIFERON TB GOLD ASSAY (BLOOD)

## 2015-02-12 ENCOUNTER — Telehealth: Payer: Self-pay

## 2015-02-12 LAB — PAP IG AND HPV HIGH-RISK
HPV, high-risk: NEGATIVE
PAP Smear Comment: 0

## 2015-02-12 NOTE — Telephone Encounter (Signed)
Patient advised as directed below.  Thanks,  -Joseline 

## 2015-02-12 NOTE — Telephone Encounter (Signed)
-----   Message from Lorie Phenix, MD sent at 02/12/2015  1:56 PM EDT ----- Pap is normal. Please notify patient.  Thanks.

## 2015-02-12 NOTE — Telephone Encounter (Signed)
-----   Message from Lorie Phenix, MD sent at 02/11/2015  5:37 PM EDT ----- TB negative.   Thanks.

## 2015-02-12 NOTE — Telephone Encounter (Signed)
LMTCB  Thanks,  -Joseline 

## 2015-02-12 NOTE — Telephone Encounter (Signed)
Advised pt of lab results. Pt verbally acknowledges understanding. Pt will call on Monday with fax number to [facility] so that we can fax results for pt's transplant. Allene Dillon, CMA

## 2015-02-24 ENCOUNTER — Telehealth: Payer: Self-pay | Admitting: Family Medicine

## 2015-02-24 NOTE — Telephone Encounter (Signed)
Please call patient and see if checking her blood pressure. Looks like her nephrologist wanted her to have a follow up with me and it is not scheduled.  Needs ov in next month or so.  Thanks.

## 2015-02-24 NOTE — Telephone Encounter (Signed)
Pt advised; made apt 03/22/2015  Thanks,   -Vernona RiegerLaura

## 2015-02-24 NOTE — Telephone Encounter (Signed)
LMTCB 02/24/2015  Thanks,   -Laura  

## 2015-03-22 ENCOUNTER — Ambulatory Visit: Payer: Self-pay | Admitting: Family Medicine

## 2015-03-26 ENCOUNTER — Ambulatory Visit (INDEPENDENT_AMBULATORY_CARE_PROVIDER_SITE_OTHER): Payer: BC Managed Care – PPO | Admitting: Family Medicine

## 2015-03-26 ENCOUNTER — Encounter: Payer: Self-pay | Admitting: Family Medicine

## 2015-03-26 VITALS — BP 140/100 | HR 70 | Temp 98.3°F | Resp 16 | Ht 64.0 in | Wt 214.0 lb

## 2015-03-26 DIAGNOSIS — I10 Essential (primary) hypertension: Secondary | ICD-10-CM | POA: Diagnosis not present

## 2015-03-26 DIAGNOSIS — N185 Chronic kidney disease, stage 5: Secondary | ICD-10-CM | POA: Diagnosis not present

## 2015-03-26 DIAGNOSIS — Z23 Encounter for immunization: Secondary | ICD-10-CM

## 2015-03-26 DIAGNOSIS — N186 End stage renal disease: Secondary | ICD-10-CM

## 2015-03-26 NOTE — Progress Notes (Signed)
Patient ID: Elizabeth Anthony, female   DOB: February 21, 1971, 44 y.o.   MRN: [id number]        Patient: Elizabeth Anthony Female    DOB: 05-31-1970   44 y.o.   MRN: [id number] Visit Date: 03/26/2015  Today's Provider: Margarita Rana, MD   Chief Complaint  Patient presents with  . Hypertension   Subjective:    HPI  Hypertension, follow-up:  BP Readings from Last 3 Encounters:  03/26/15 140/100  02/09/15 140/104  01/26/15 150/104    She was last seen for hypertension 7 weeks ago.  BP at that visit was 140/104. Management changes since that visit include none. She reports excellent compliance with treatment. She is not having side effects.  She is exercising. She is adherent to low salt diet.   Outside blood pressures are stable. She is experiencing none.  Patient denies chest pain.   Cardiovascular risk factors include none.  Use of agents associated with hypertension: none.     Weight trend: increasing steadily Wt Readings from Last 3 Encounters:  03/26/15 214 lb (97.07 kg)  02/09/15 221 lb (100.245 kg)  01/26/15 217 lb (98.431 kg)    Current diet: in general, a "healthy" diet    ------------------------------------------------------------------------       Allergies  Allergen Reactions  . Dye Fdc Red [Red Dye] Other (See Comments)    Kidney failure  . Nsaids     Shoots creatine up  . Ace Inhibitors Other (See Comments)    Uncontrollable Coughing & Choking  . Alcohol Itching    Hard liquor (Vodka)   Previous Medications   FLUTICASONE (FLONASE) 50 MCG/ACT NASAL SPRAY    1 spray by Each Nare route Two (2) times a day.   METOPROLOL SUCCINATE (TOPROL-XL) 50 MG 24 HR TABLET    Take 50 mg by mouth daily. Take with or immediately following a meal.   OVER THE COUNTER MEDICATION    Take by mouth.    OVER THE COUNTER MEDICATION    Take by mouth. Essential oils   OVER THE COUNTER MEDICATION    Take by mouth.   OVER THE COUNTER MEDICATION    Take by mouth.   VITAMIN D, ERGOCALCIFEROL, (DRISDOL) 50000 UNITS CAPS CAPSULE    Take 1 capsule by mouth daily.    Review of Systems  Constitutional: Negative.   Respiratory: Negative.   Cardiovascular: Negative.   Gastrointestinal: Positive for abdominal pain, diarrhea and abdominal distention.  Endocrine: Positive for polydipsia.  Psychiatric/Behavioral: Negative.     Social History  Substance Use Topics  . Smoking status: Never Smoker   . Smokeless tobacco: Never Used  . Alcohol Use: Yes     Comment: occasional   Objective:   BP 140/100 mmHg  Pulse 70  Temp(Src) 98.3 F (36.8 C) (Oral)  Resp 16  Ht '5\' 4"'  (1.626 m)  Wt 214 lb (97.07 kg)  BMI 36.72 kg/m2  SpO2 99%  LMP 03/15/2015 (Exact Date)  Physical Exam  Constitutional: She is oriented to person, place, and time. She appears well-developed and well-nourished.  Cardiovascular: Normal rate and regular rhythm.   Pulmonary/Chest: Effort normal and breath sounds normal.  Neurological: She is alert and oriented to person, place, and time.  Psychiatric: She has a normal mood and affect. Her behavior is normal. Judgment and thought content normal.      Assessment & Plan:     1. Essential hypertension, benign Elevated today. Will call her nephrology.    2. Kidney disease, chronic,  stage V (end stage, EGFR < 15 ml/min) (HCC) Worsening. Will check labs.   - CBC with Differential/Platelet - Comprehensive metabolic panel - Lipid panel - Protein / Creatinine Ratio, Urine - VITAMIN D 25 Hydroxy (Vit-D Deficiency, Fractures) - Parathyroid hormone, intact (no Ca)  3. Need for influenza vaccination Given today.  - Flu Vaccine QUAD 36+ mos IM  4. Need for pneumococcal vaccination Given today.   - Pneumococcal conjugate vaccine 13-valent IM     Margarita Rana, MD  [facility]

## 2015-03-27 NOTE — Addendum Note (Signed)
Addended by: Leo GrosserMALONEY, Ahaan Zobrist J on: 03/27/2015 05:19 PM   Modules accepted: Kipp BroodSmartSet

## 2015-03-31 ENCOUNTER — Telehealth: Payer: Self-pay

## 2015-03-31 LAB — CBC WITH DIFFERENTIAL/PLATELET
BASOS: 0 %
Basophils Absolute: 0 10*3/uL (ref 0.0–0.2)
EOS (ABSOLUTE): 0.1 10*3/uL (ref 0.0–0.4)
EOS: 2 %
Hematocrit: 37.6 % (ref 34.0–46.6)
Hemoglobin: 12.7 g/dL (ref 11.1–15.9)
LYMPHS ABS: 1.9 10*3/uL (ref 0.7–3.1)
Lymphs: 25 %
MCH: 33.2 pg — AB (ref 26.6–33.0)
MCHC: 33.8 g/dL (ref 31.5–35.7)
MCV: 98 fL — ABNORMAL HIGH (ref 79–97)
MONOCYTES: 9 %
MONOS ABS: 0.7 10*3/uL (ref 0.1–0.9)
NEUTROS ABS: 4.8 10*3/uL (ref 1.4–7.0)
Neutrophils: 64 %
PLATELETS: 292 10*3/uL (ref 150–379)
RBC: 3.82 x10E6/uL (ref 3.77–5.28)
RDW: 12.4 % (ref 12.3–15.4)
WBC: 7.5 10*3/uL (ref 3.4–10.8)

## 2015-03-31 LAB — COMPREHENSIVE METABOLIC PANEL
A/G RATIO: 0.9 — AB (ref 1.1–2.5)
ALK PHOS: 124 IU/L — AB (ref 39–117)
ALT: 9 IU/L (ref 0–32)
AST: 12 IU/L (ref 0–40)
Albumin: 3.6 g/dL (ref 3.5–5.5)
BILIRUBIN TOTAL: 0.2 mg/dL (ref 0.0–1.2)
BUN / CREAT RATIO: 11 (ref 9–23)
BUN: 59 mg/dL — ABNORMAL HIGH (ref 6–24)
CO2: 22 mmol/L (ref 18–29)
Calcium: 9 mg/dL (ref 8.7–10.2)
Chloride: 105 mmol/L (ref 97–106)
Creatinine, Ser: 5.45 mg/dL — ABNORMAL HIGH (ref 0.57–1.00)
GFR calc Af Amer: 10 mL/min/{1.73_m2} — ABNORMAL LOW (ref >59)
GFR, EST NON AFRICAN AMERICAN: 9 mL/min/{1.73_m2} — AB (ref >59)
GLUCOSE: 96 mg/dL (ref 65–99)
Globulin, Total: 4 g/dL (ref 1.5–4.5)
Potassium: 4.9 mmol/L (ref 3.5–5.2)
SODIUM: 137 mmol/L (ref 136–144)
TOTAL PROTEIN: 7.6 g/dL (ref 6.0–8.5)

## 2015-03-31 LAB — LIPID PANEL
CHOL/HDL RATIO: 4 ratio (ref 0.0–4.4)
CHOLESTEROL TOTAL: 185 mg/dL (ref 100–199)
HDL: 46 mg/dL (ref >39)
LDL CALC: 111 mg/dL — AB (ref 0–99)
Triglycerides: 138 mg/dL (ref 0–149)
VLDL Cholesterol Cal: 28 mg/dL (ref 5–40)

## 2015-03-31 LAB — PARATHYROID HORMONE, INTACT (NO CA): PTH: 347 pg/mL — AB (ref 15–65)

## 2015-03-31 LAB — PROTEIN / CREATININE RATIO, URINE
Creatinine, Urine: 89.5 mg/dL
PROTEIN UR: 657.4 mg/dL
PROTEIN/CREAT RATIO: 7345 mg/g{creat} — AB (ref 0–200)

## 2015-03-31 LAB — VITAMIN D 25 HYDROXY (VIT D DEFICIENCY, FRACTURES): Vit D, 25-Hydroxy: 15.3 ng/mL — ABNORMAL LOW (ref 30.0–100.0)

## 2015-03-31 NOTE — Telephone Encounter (Signed)
Faxed results to Dr. Val Eagleolindres at [phone]. LMTCB to inform pt. Allene DillonEmily Drozdowski, CMA

## 2015-03-31 NOTE — Telephone Encounter (Signed)
-----   Message from Lorie PhenixNancy Maloney, MD sent at 03/31/2015  7:11 AM EST ----- Kidney function off. Please send labs to her nephrologist. Thanks.

## 2015-04-19 ENCOUNTER — Telehealth: Payer: Self-pay

## 2015-04-19 DIAGNOSIS — N189 Chronic kidney disease, unspecified: Secondary | ICD-10-CM

## 2015-04-19 DIAGNOSIS — D631 Anemia in chronic kidney disease: Secondary | ICD-10-CM

## 2015-04-19 DIAGNOSIS — N186 End stage renal disease: Secondary | ICD-10-CM

## 2015-04-19 NOTE — Telephone Encounter (Signed)
Ok to order labs. Thanks.   

## 2015-04-19 NOTE — Telephone Encounter (Signed)
Pt is due for her labs.  She says she needs the same ones as last visit (Minus the CPE Labs.)  Please fax results to her Transplant coordinator Annabell HowellsMegan Vink.  Fax number is [phone]; Phone number is:  [phone]; and fax to Dr. Val Eagleolindres (Her nephrologist) at [phone].  She also needs her Hep b vaccines.  I scheduled her first apt for December 15th at 4:30   Thanks,   -Vernona RiegerLaura

## 2015-04-20 NOTE — Telephone Encounter (Signed)
Labs ordered.  Thanks,   -Vernona RiegerLaura

## 2015-04-22 ENCOUNTER — Telehealth: Payer: Self-pay

## 2015-04-22 LAB — CBC WITH DIFFERENTIAL/PLATELET
BASOS ABS: 0 10*3/uL (ref 0.0–0.2)
Basos: 0 %
EOS (ABSOLUTE): 0.2 10*3/uL (ref 0.0–0.4)
EOS: 2 %
HEMOGLOBIN: 12.9 g/dL (ref 11.1–15.9)
Hematocrit: 37 % (ref 34.0–46.6)
LYMPHS ABS: 2.8 10*3/uL (ref 0.7–3.1)
Lymphs: 31 %
MCH: 33.4 pg — ABNORMAL HIGH (ref 26.6–33.0)
MCHC: 34.9 g/dL (ref 31.5–35.7)
MCV: 96 fL (ref 79–97)
MONOS ABS: 0.6 10*3/uL (ref 0.1–0.9)
Monocytes: 7 %
NEUTROS PCT: 60 %
Neutrophils Absolute: 5.5 10*3/uL (ref 1.4–7.0)
PLATELETS: 320 10*3/uL (ref 150–379)
RBC: 3.86 x10E6/uL (ref 3.77–5.28)
RDW: 12.5 % (ref 12.3–15.4)
WBC: 9 10*3/uL (ref 3.4–10.8)

## 2015-04-22 LAB — COMPREHENSIVE METABOLIC PANEL
ALBUMIN: 3.4 g/dL — AB (ref 3.5–5.5)
ALT: 13 IU/L (ref 0–32)
AST: 14 IU/L (ref 0–40)
Albumin/Globulin Ratio: 0.9 — ABNORMAL LOW (ref 1.1–2.5)
Alkaline Phosphatase: 126 IU/L — ABNORMAL HIGH (ref 39–117)
BUN / CREAT RATIO: 11 (ref 9–23)
BUN: 68 mg/dL — AB (ref 6–24)
Bilirubin Total: 0.2 mg/dL (ref 0.0–1.2)
CALCIUM: 8.6 mg/dL — AB (ref 8.7–10.2)
CHLORIDE: 102 mmol/L (ref 97–106)
CO2: 21 mmol/L (ref 18–29)
CREATININE: 6.2 mg/dL — AB (ref 0.57–1.00)
GFR calc non Af Amer: 8 mL/min/{1.73_m2} — ABNORMAL LOW (ref >59)
GFR, EST AFRICAN AMERICAN: 9 mL/min/{1.73_m2} — AB (ref >59)
GLUCOSE: 106 mg/dL — AB (ref 65–99)
Globulin, Total: 3.9 g/dL (ref 1.5–4.5)
Potassium: 4.7 mmol/L (ref 3.5–5.2)
Sodium: 133 mmol/L — ABNORMAL LOW (ref 136–144)
TOTAL PROTEIN: 7.3 g/dL (ref 6.0–8.5)

## 2015-04-22 LAB — PROTEIN / CREATININE RATIO, URINE
Creatinine, Urine: 103.3 mg/dL
PROTEIN UR: 665.3 mg/dL
PROTEIN/CREAT RATIO: 6440 mg/g{creat} — AB (ref 0–200)

## 2015-04-22 LAB — PARATHYROID HORMONE, INTACT (NO CA): PTH: 619 pg/mL — ABNORMAL HIGH (ref 15–65)

## 2015-04-22 NOTE — Telephone Encounter (Signed)
Advised pt of lab results. Pt verbally acknowledges understanding. Emily Drozdowski, CMA   

## 2015-04-22 NOTE — Telephone Encounter (Signed)
-----   Message from Lorie PhenixNancy Maloney, MD sent at 04/22/2015  7:35 AM EST ----- Kidney function slightly decreased further. Please notify patient and clarify if she wants her labs sent to both  her nephrologist and transplant coordinator. Thanks.

## 2015-04-29 ENCOUNTER — Ambulatory Visit (INDEPENDENT_AMBULATORY_CARE_PROVIDER_SITE_OTHER): Payer: BC Managed Care – PPO | Admitting: Family Medicine

## 2015-04-29 ENCOUNTER — Ambulatory Visit
Admission: RE | Admit: 2015-04-29 | Discharge: 2015-04-29 | Disposition: A | Discharge status: Home / Self Care | Payer: BC Managed Care – PPO | Source: Ambulatory Visit | Attending: Family Medicine | Admitting: Family Medicine

## 2015-04-29 DIAGNOSIS — Z1231 Encounter for screening mammogram for malignant neoplasm of breast: Secondary | ICD-10-CM | POA: Diagnosis present

## 2015-04-29 DIAGNOSIS — Z949 Transplanted organ and tissue status, unspecified: Secondary | ICD-10-CM

## 2015-04-29 DIAGNOSIS — Z23 Encounter for immunization: Secondary | ICD-10-CM | POA: Diagnosis not present

## 2015-04-29 DIAGNOSIS — Z1239 Encounter for other screening for malignant neoplasm of breast: Secondary | ICD-10-CM

## 2015-04-29 MED ORDER — HEPATITIS B VAC RECOMBINANT 20 MCG/ML IJ SUSP: 1.0000 mL | Freq: Once | INTRAMUSCULAR | Status: AC

## 2015-05-31 ENCOUNTER — Ambulatory Visit (INDEPENDENT_AMBULATORY_CARE_PROVIDER_SITE_OTHER): Payer: BC Managed Care – PPO | Admitting: Family Medicine

## 2015-05-31 DIAGNOSIS — Z23 Encounter for immunization: Secondary | ICD-10-CM

## 2015-05-31 NOTE — Progress Notes (Signed)
Shot given today. Thanks.

## 2015-06-08 ENCOUNTER — Other Ambulatory Visit: Payer: Self-pay

## 2015-06-08 ENCOUNTER — Encounter (HOSPITAL_COMMUNITY): Payer: Self-pay | Admitting: Emergency Medicine

## 2015-06-08 ENCOUNTER — Emergency Department (HOSPITAL_COMMUNITY)
Admission: EM | Admit: 2015-06-08 | Discharge: 2015-06-08 | Disposition: A | Discharge status: Home / Self Care | Payer: BC Managed Care – PPO | Attending: Emergency Medicine | Admitting: Emergency Medicine

## 2015-06-08 ENCOUNTER — Emergency Department (HOSPITAL_COMMUNITY): Payer: BC Managed Care – PPO

## 2015-06-08 DIAGNOSIS — Z79899 Other long term (current) drug therapy: Secondary | ICD-10-CM | POA: Diagnosis not present

## 2015-06-08 DIAGNOSIS — N189 Chronic kidney disease, unspecified: Secondary | ICD-10-CM | POA: Insufficient documentation

## 2015-06-08 DIAGNOSIS — Z8659 Personal history of other mental and behavioral disorders: Secondary | ICD-10-CM | POA: Insufficient documentation

## 2015-06-08 DIAGNOSIS — Z7951 Long term (current) use of inhaled steroids: Secondary | ICD-10-CM | POA: Diagnosis not present

## 2015-06-08 DIAGNOSIS — I129 Hypertensive chronic kidney disease with stage 1 through stage 4 chronic kidney disease, or unspecified chronic kidney disease: Secondary | ICD-10-CM | POA: Diagnosis not present

## 2015-06-08 DIAGNOSIS — R002 Palpitations: Secondary | ICD-10-CM | POA: Diagnosis present

## 2015-06-08 DIAGNOSIS — G43909 Migraine, unspecified, not intractable, without status migrainosus: Secondary | ICD-10-CM | POA: Insufficient documentation

## 2015-06-08 LAB — CBC
HCT: 34 % — ABNORMAL LOW (ref 36.0–46.0)
Hemoglobin: 11.6 g/dL — ABNORMAL LOW (ref 12.0–15.0)
MCH: 33.6 pg (ref 26.0–34.0)
MCHC: 34.1 g/dL (ref 30.0–36.0)
MCV: 98.6 fL (ref 78.0–100.0)
Platelets: 353 10*3/uL (ref 150–400)
RBC: 3.45 MIL/uL — ABNORMAL LOW (ref 3.87–5.11)
RDW: 13 % (ref 11.5–15.5)
WBC: 8.3 10*3/uL (ref 4.0–10.5)

## 2015-06-08 LAB — BASIC METABOLIC PANEL
Anion gap: 12 (ref 5–15)
BUN: 63 mg/dL — ABNORMAL HIGH (ref 6–20)
CO2: 20 mmol/L — ABNORMAL LOW (ref 22–32)
Calcium: 9.4 mg/dL (ref 8.9–10.3)
Chloride: 105 mmol/L (ref 101–111)
Creatinine, Ser: 6.55 mg/dL — ABNORMAL HIGH (ref 0.44–1.00)
GFR calc Af Amer: 8 mL/min — ABNORMAL LOW (ref >60)
GFR calc non Af Amer: 7 mL/min — ABNORMAL LOW (ref >60)
Glucose, Bld: 95 mg/dL (ref 65–99)
Potassium: 4.6 mmol/L (ref 3.5–5.1)
Sodium: 137 mmol/L (ref 135–145)

## 2015-06-08 LAB — I-STAT TROPONIN, ED: Troponin i, poc: 0 ng/mL (ref 0.00–0.08)

## 2015-06-08 LAB — MAGNESIUM: MAGNESIUM: 1.9 mg/dL (ref 1.7–2.4)

## 2015-06-08 NOTE — ED Provider Notes (Signed)
CSN: [id number]     Arrival date & time 06/08/15  1110 History   None    Chief Complaint  Patient presents with  . 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 She is scheduled to have a peritoneal dialysis catheter placed soon. Her creatinine this last Thursday was 6.52. The past day or so she's been having palpitations which she describes as her heart skipping a beat which is why she comes to the ED today.  She has never had palpitations before. She is no chest pain, shortness of breath, lightheadedness , syncope.  Past Medical History  Diagnosis Date  . Migraine   . Hypertension   . Renal disorder   . Allergy   . Anxiety    Past Surgical History  Procedure Laterality Date  . Breast reduction surgery Bilateral   . Reduction mammaplasty  1998   Family History  Problem Relation Age of Onset  . Cancer Mother     breast   . Kidney disease Mother   . Breast cancer Mother 54   Social History  Substance Use Topics  . Smoking status: Never Smoker   . Smokeless tobacco: Never Used  . Alcohol Use: Yes     Comment: occasional   OB History    No data available     Review of Systems  Constitutional: Negative for fever and chills.  HENT: Negative for nosebleeds.   Eyes: Negative for visual disturbance.  Respiratory: Negative for cough and shortness of breath.   Cardiovascular: Positive for palpitations. Negative for chest pain.  Gastrointestinal: Negative for nausea, vomiting, abdominal pain, diarrhea and constipation.  Genitourinary: Negative for dysuria.  Skin: Negative for rash.  Neurological: Negative for weakness.  All other systems reviewed and are negative.     Allergies  Dye fdc red; Nsaids;  Ace inhibitors; and Alcohol  Home Medications   Prior to Admission medications   Medication Sig Start Date End Date Taking? Authorizing Provider  amLODipine (NORVASC) 10 MG tablet Take 10 mg by mouth daily. 04/06/15 04/05/16 Yes Historical Provider, MD  fluticasone (FLONASE) 50 MCG/ACT nasal spray 1 spray by Each Nare route Two (2) times a day.   Yes Historical Provider, MD  metoprolol succinate (TOPROL-XL) 50 MG 24 hr tablet Take 50 mg by mouth daily. Take with or immediately following a meal.   Yes Historical Provider, MD  OVER THE COUNTER MEDICATION Take by mouth.     Historical Provider, MD  OVER THE COUNTER MEDICATION Take by mouth. Reported on 06/08/2015    Historical Provider, MD  OVER THE COUNTER MEDICATION Take by mouth.    Historical Provider, MD  OVER THE COUNTER MEDICATION Take by mouth.    Historical Provider, MD  Vitamin D, Ergocalciferol, (DRISDOL) 50000 UNITS CAPS capsule Take 1 capsule by mouth daily. 07/15/14 07/15/15  Historical Provider, MD   BP 150/107 mmHg  Pulse 85  Temp(Src) 97.4 F (36.3 C) (Oral)  Resp 18  Ht  (1.651 m)  SpO2 100%  LMP 06/05/2015 Physical Exam  Constitutional: She is oriented to person, place, and time. No distress.  HENT:  Head: Normocephalic and atraumatic.  Eyes: EOM are normal. Pupils are equal, round, and reactive to light.  Neck: Normal range of motion. Neck supple.  Cardiovascular: Normal rate, normal heart sounds  and intact distal pulses.   Pulmonary/Chest: No respiratory distress.  Abdominal: Soft. There is no tenderness.  Musculoskeletal: Normal range of motion.  Neurological: She is alert and oriented to person, place, and time.  Skin: No rash noted. She is not diaphoretic.  Psychiatric: She has a normal mood and affect.    ED Course  Procedures (including critical care time) Labs Review Labs Reviewed  BASIC METABOLIC PANEL - Abnormal; Notable for the following:    CO2 20 (*)    BUN 63 (*)    Creatinine, Ser 6.55 (*)     GFR calc non Af Amer 7 (*)    GFR calc Af Amer 8 (*)    All other components within normal limits  CBC - Abnormal; Notable for the following:    RBC 3.45 (*)    Hemoglobin 11.6 (*)    HCT 34.0 (*)    All other components within normal limits  MAGNESIUM  I-STAT TROPOININ, ED    Imaging Review Dg Chest 2 View  06/08/2015  CLINICAL DATA:  Irregular heartbeat, stage 5 kidney disease, hypertension EXAM: CHEST  2 VIEW COMPARISON:  None. FINDINGS: Normal heart size, mediastinal contours, and pulmonary vascularity normal. Lung clear. No acute infiltrate, pleural effusion, or pneumothorax. Bones unremarkable. IMPRESSION: No acute abnormalities. Electronically Signed   By: Ulyses Southward M.D.   On: 06/08/2015 12:59   I have personally reviewed and evaluated these images and lab results as part of my medical decision-making.   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  Cbc/bmp/mag/ekg obtained. ekg w/o wpw, long qt or brugada.  Bmp with creatinine 6.55 which was 6.52 on Thursday of this past week.  Cbc unremarkable.    While in the department, she had sensation of her heart skipping a beat and a single PVC was seen on telemetry at that time.  Given this I feel she is likely having pvc's and is safe for d/c with close pcp f/u  I have discussed the results, Dx and Tx plan with the pt.  They expressed understanding and agree with the plan and were told to return to ED with any worsening of condition or concern.    Disposition: Discharge  Condition: Good  Discharge Medication List as of 06/08/2015  7:04 PM      Follow Up: Lorie Phenix, MD [address] [place] [address] [place] [address] [phone]  In 2 days    Pt seen in conjunction with Dr. Carmon Ginsberg, MD 06/09/15 1243  Raeford Razor, MD 06/16/15 1100

## 2015-06-08 NOTE — ED Notes (Addendum)
Patient had a PVC on the monitor. Printed from the general review and given to provider.

## 2015-06-08 NOTE — Discharge Instructions (Signed)

## 2015-06-08 NOTE — ED Notes (Signed)
C/o palpitations X 1 week, denies CP, denies SOB, A/O X4 ambulatory and in NAD

## 2015-06-09 ENCOUNTER — Encounter: Payer: Self-pay | Admitting: Physician Assistant

## 2015-06-09 ENCOUNTER — Ambulatory Visit (INDEPENDENT_AMBULATORY_CARE_PROVIDER_SITE_OTHER): Payer: BC Managed Care – PPO | Admitting: Physician Assistant

## 2015-06-09 VITALS — BP 118/62 | HR 72 | Temp 98.6°F | Resp 16 | Wt 218.0 lb

## 2015-06-09 DIAGNOSIS — I493 Ventricular premature depolarization: Secondary | ICD-10-CM | POA: Diagnosis not present

## 2015-06-09 NOTE — Progress Notes (Signed)
Patient ID: Elizabeth Anthony, female   DOB: 03/24/1971, 45 y.o.   MRN: [id number]       Patient: Elizabeth Anthony Female    DOB: [birth year]   45 y.o.   MRN: [id number] Visit Date: 06/09/2015  Today's Provider: Margaretann Loveless, PA-C   Chief Complaint  Patient presents with  . Hospitalization Follow-up  . Palpitations   Subjective:     Hospital Follow up:  Patient was seen in [facility] ER yesterday due to having palpitations. She reports that medications were not changed, and her symptoms have been stable. Patient reports that she feels a little better today.   Palpitations  This is a recurrent problem. The current episode started yesterday. The problem occurs intermittently. The problem has been waxing and waning. Nothing aggravates the symptoms. Associated symptoms include anxiety, chest fullness, an irregular heartbeat, malaise/fatigue, nausea and shortness of breath. Pertinent negatives include no chest pain, coughing, fever or vomiting.   Patient reports that her symptoms onset yesterday afternoon while she was at work. 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 Patient reports that she has palpitations often, but this was the first time they never went away. Patient reports that she has been under a lot of stress at work trying to get through school testing. Patient also has chronic kidney disease that is F/B her nephrologist, and she mentioned this to him as well.     Allergies  Allergen Reactions  . Dye Fdc Red [Red Dye] Other (See Comments)    Kidney failure  . Nsaids     Shoots creatine up  . Ace Inhibitors Other (See Comments)    Uncontrollable Coughing & Choking  . Alcohol Itching    Hard liquor (Vodka)   Previous Medications   AMLODIPINE (NORVASC) 10 MG TABLET    Take 10 mg by mouth daily.   FLUTICASONE (FLONASE) 50 MCG/ACT NASAL SPRAY    1 spray by Each Nare route Two (2)  times a day.   METOPROLOL SUCCINATE (TOPROL-XL) 50 MG 24 HR TABLET    Take 50 mg by mouth daily. Take with or immediately following a meal.   OVER THE COUNTER MEDICATION    Take by mouth. Reported on 06/09/2015   OVER THE COUNTER MEDICATION    Take by mouth. Reported on 06/08/2015   OVER THE COUNTER MEDICATION    Take by mouth.   OVER THE COUNTER MEDICATION    Take by mouth.   VITAMIN D, ERGOCALCIFEROL, (DRISDOL) 50000 UNITS CAPS CAPSULE    Take 1 capsule by mouth daily.    Review of Systems  Constitutional: Positive for malaise/fatigue and fatigue. Negative for fever, chills and unexpected weight change.  Respiratory: Positive for shortness of breath. Negative for cough, chest tightness and wheezing.   Cardiovascular: Positive for palpitations. Negative for chest pain and leg swelling.  Gastrointestinal: Positive for nausea. Negative for vomiting and abdominal pain.  Neurological: Negative.   Psychiatric/Behavioral: The patient is nervous/anxious.     Social History  Substance Use Topics  . Smoking status: Never Smoker   . Smokeless tobacco: Never Used  . Alcohol Use: Yes     Comment: occasional   Objective:   LMP 06/05/2015  Physical Exam  Constitutional: She appears well-developed and well-nourished. No distress.  Neck: Normal range of motion. Neck supple. No tracheal deviation present. No thyromegaly present.  Cardiovascular: Normal rate, regular rhythm and normal heart sounds.  Exam reveals no gallop and no friction rub.   No murmur heard. Pulmonary/Chest: Effort normal and breath sounds normal. No respiratory distress. She has no wheezes. She has no rales.  Lymphadenopathy:    She has no cervical adenopathy.  Skin: She is not diaphoretic.  Vitals reviewed.       Assessment & Plan:     1. PVC (premature ventricular contraction) Reviewed emergency department workup and labs. All was negative and she does feel like it was exacerbated by anxiety. However she would like to  be referred to a cardiologist due to her end-stage renal disease since she is getting ready to go on dialysis. She feels it would probably be best to be formally evaluated to rule out any worse cause. Cardiology referral has been made and we will facilitate getting this appointment for her. She is to call the office in the meantime if she has any worsening symptoms, acute issues, questions or concerns. - Ambulatory referral to Cardiology       Margaretann Loveless, PA-C  [facility]

## 2015-07-07 ENCOUNTER — Ambulatory Visit (INDEPENDENT_AMBULATORY_CARE_PROVIDER_SITE_OTHER): Payer: BC Managed Care – PPO | Admitting: Family Medicine

## 2015-07-07 ENCOUNTER — Encounter: Payer: Self-pay | Admitting: Family Medicine

## 2015-07-07 ENCOUNTER — Telehealth: Payer: Self-pay | Admitting: Family Medicine

## 2015-07-07 VITALS — BP 136/102 | HR 88 | Temp 98.3°F | Resp 16 | Wt 229.0 lb

## 2015-07-07 DIAGNOSIS — M62838 Other muscle spasm: Secondary | ICD-10-CM | POA: Insufficient documentation

## 2015-07-07 DIAGNOSIS — F419 Anxiety disorder, unspecified: Secondary | ICD-10-CM | POA: Insufficient documentation

## 2015-07-07 MED ORDER — CYCLOBENZAPRINE HCL 5 MG PO TABS: 5.0000 mg | ORAL_TABLET | Freq: Three times a day (TID) | ORAL | Status: DC | PRN

## 2015-07-07 MED ORDER — ALPRAZOLAM 0.5 MG PO TABS: 0.5000 mg | ORAL_TABLET | Freq: Two times a day (BID) | ORAL | Status: DC | PRN

## 2015-07-07 NOTE — Progress Notes (Signed)
Subjective:    Patient ID: Elizabeth Anthony, female    DOB: 09/23/1970, 45 y.o.   MRN: [id number]  HPI  Muscle Pain: Patient complains of myalgias for which has been present for a few years. Pain is located in feet, hands, right side, and "everywhere" and is described as stabbing and tight band, and is intermittent .  Associated symptoms include: decreased range of motion.  The patient has essential oils, warm bath, massage, and oxycodone.  Related to injury:   No. Pt states she tried Oxycodone yesterday, and even with the medication the pain reached a 6/10 on a pain scale.   Also has had port placed to start peritoneal dialysis in March.   Showing some signs of uremia.   Review of Systems  Constitutional: Positive for chills, diaphoresis, activity change and unexpected weight change. Negative for appetite change.  Musculoskeletal: Positive for myalgias.   BP 136/102 mmHg  Pulse 88  Temp(Src) 98.3 F (36.8 C) (Oral)  Resp 16  Wt 229 lb (103.874 kg)  LMP 07/02/2015   Patient Active Problem List   Diagnosis Date Noted  . Abnormal EKG 01/26/2015  . Sleep apnea 01/26/2015  . 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 Umbilical hernia without obstruction or gangrene 01/26/2015  . Cervicalgia 01/26/2015  . Pain in joint, forearm 01/26/2015  . Papanicolaou smear of cervix with low grade squamous intraepithelial lesion (LGSIL) 01/26/2015  . Premenopausal menorrhagia 01/26/2015  . Irregular menses 01/26/2015  . Essential hypertension, benign 01/26/2015  . Kidney disease, chronic, stage V (end stage, EGFR < 15 ml/min) ([place]) 01/26/2015  . Fatigue 01/26/2015  . Skin photosensitivity 01/26/2015  . Rhinitis, allergic 01/26/2015  . Calf pain 01/26/2015  . Piriformis muscle pain 01/26/2015  . Pleural effusion 01/26/2015  . Elevated WBC count 01/26/2015  . Anemia in CKD (chronic kidney disease) 01/26/2015  . Knee pain 01/26/2015  . Vitamin D deficiency 01/26/2015  . Chest pain 01/26/2015  .  Anemia associated with chronic renal failure 03/25/2014   Past Medical History  Diagnosis Date  . Migraine   . Hypertension   . Renal disorder   . Allergy   . Anxiety    Current Outpatient Prescriptions on File Prior to Visit  Medication Sig  . amLODipine (NORVASC) 10 MG tablet Take 10 mg by mouth daily.  . fluticasone (FLONASE) 50 MCG/ACT nasal spray 1 spray by Each Nare route Two (2) times a day.  . metoprolol succinate (TOPROL-XL) 50 MG 24 hr tablet Take 50 mg by mouth daily. Take with or immediately following a meal.  . OVER THE COUNTER MEDICATION Take by mouth. Reported on 06/09/2015  . OVER THE COUNTER MEDICATION Take by mouth. Reported on 06/08/2015  . OVER THE COUNTER MEDICATION Take by mouth.  Marland Kitchen OVER THE COUNTER MEDICATION Take by mouth.  . Vitamin D, Ergocalciferol, (DRISDOL) 50000 UNITS CAPS capsule Take 1 capsule by mouth daily.   Current Facility-Administered Medications on File Prior to Visit  Medication  . hepatitis B vaccine (ENGERIX-B) injection 20 mcg   Allergies  Allergen Reactions  . Dye Fdc Red [Red Dye] Other (See Comments)    Kidney failure  . Nsaids     Shoots creatine up  . Ace Inhibitors Other (See Comments)    Uncontrollable Coughing & Choking  . Alcohol Itching    Hard liquor (Vodka)   Past Surgical History  Procedure Laterality Date  . Breast reduction surgery Bilateral   . Reduction mammaplasty  1998   Social History  Social History  . Marital Status: Single    Spouse Name: N/A  . Number of Children: N/A  . Years of Education: N/A   Occupational History  . Not on file.   Social History Main Topics  . Smoking status: Never Smoker   . Smokeless tobacco: Never Used  . Alcohol Use: No  . Drug Use: No  . Sexual Activity: Not on file   Other Topics Concern  . Not on file   Social History Narrative   Family History  Problem Relation Age of Onset  . Cancer Mother     breast   . Kidney disease Mother   . Breast cancer Mother 67        Objective:   Physical Exam  Constitutional: She is oriented to person, place, and time. She appears well-developed and well-nourished.  Cardiovascular: Normal rate and regular rhythm.   Pulmonary/Chest: Effort normal and breath sounds normal.  Abdominal:  Bandages noted.   Neurological: She is alert and oriented to person, place, and time.  Psychiatric: She has a normal mood and affect.   BP 136/102 mmHg  Pulse 88  Temp(Src) 98.3 F (36.8 C) (Oral)  Resp 16  Wt 229 lb (103.874 kg)  LMP 07/02/2015      Assessment & Plan:  1. Muscle spasm New problem.  Will treat. Patient instructed to call back if condition worsens or does not improve.   Suspect will improve with dialysis. Reviewed labs.   - cyclobenzaprine (FLEXERIL) 5 MG tablet; Take 1 tablet (5 mg total) by mouth 3 (three) times daily as needed for muscle spasms.  Dispense: 90 tablet; Refill: 1  2. Acute anxiety New problem. Did have panic attack.   Will give rx for Xanax to use as needed. Knows addictive. Will only take as needed. Wants to try breathing etc first. Was against dialysis but has made her peace with it. Is on transplant list.  - ALPRAZolam (XANAX) 0.5 MG tablet; Take 1 tablet (0.5 mg total) by mouth 2 (two) times daily as needed for anxiety.  Dispense: 20 tablet; Refill: 0   Patient was seen and examined by Jerrell Belfast, MD, and note scribed by Renaldo Fiddler, CMA. I have reviewed the document for accuracy and completeness and I agree with above. Jerrell Belfast, MD   Margarita Rana, MD   .njm

## 2015-07-23 ENCOUNTER — Telehealth: Payer: Self-pay | Admitting: Family Medicine

## 2015-07-23 NOTE — Telephone Encounter (Signed)
[facility] requesting a copy of pt immunization records faxed to her fax # [phone].  Thanks CC

## 2015-07-23 NOTE — Telephone Encounter (Signed)
This is dr. Ulyses Amormaloneys patient, record faxed over-aa

## 2015-07-27 ENCOUNTER — Other Ambulatory Visit
Admission: RE | Admit: 2015-07-27 | Discharge: 2015-07-27 | Disposition: A | Discharge status: Home / Self Care | Payer: BC Managed Care – PPO | Source: Other Acute Inpatient Hospital | Attending: Internal Medicine | Admitting: Internal Medicine

## 2015-07-27 DIAGNOSIS — K650 Generalized (acute) peritonitis: Secondary | ICD-10-CM | POA: Diagnosis not present

## 2015-07-27 LAB — CSF CELL COUNT WITH DIFFERENTIAL
EOS CSF: 4 %
LYMPHS CSF: 1 %
MONOCYTE-MACROPHAGE-SPINAL FLUID: 14 %
RBC Count, CSF: 0 /mm3 (ref 0–3)
SEGMENTED NEUTROPHILS-CSF: 81 %
TUBE #: 2
WBC, CSF: 339 /mm3

## 2015-07-28 LAB — PATHOLOGIST SMEAR REVIEW

## 2015-07-30 LAB — CSF CULTURE

## 2015-07-30 LAB — CSF CULTURE W GRAM STAIN: Culture: NO GROWTH

## 2015-08-04 ENCOUNTER — Other Ambulatory Visit: Payer: Self-pay | Admitting: Family Medicine

## 2015-08-04 ENCOUNTER — Ambulatory Visit (INDEPENDENT_AMBULATORY_CARE_PROVIDER_SITE_OTHER): Payer: BC Managed Care – PPO | Admitting: Family Medicine

## 2015-08-04 ENCOUNTER — Encounter: Payer: Self-pay | Admitting: Family Medicine

## 2015-08-04 VITALS — BP 100/70 | HR 104 | Temp 99.2°F | Resp 16 | Wt 225.0 lb

## 2015-08-04 DIAGNOSIS — N185 Chronic kidney disease, stage 5: Secondary | ICD-10-CM

## 2015-08-04 DIAGNOSIS — N186 End stage renal disease: Secondary | ICD-10-CM

## 2015-08-04 DIAGNOSIS — R509 Fever, unspecified: Secondary | ICD-10-CM | POA: Diagnosis not present

## 2015-08-04 LAB — POCT INFLUENZA A/B
Influenza A, POC: NEGATIVE
Influenza B, POC: NEGATIVE

## 2015-08-04 NOTE — Progress Notes (Signed)
Subjective:    Patient ID: Elizabeth Anthony, female    DOB: 01/11/1971, 45 y.o.   MRN: [id number]  URI  This is a new problem. The current episode started in the past 7 days (x 3 days). The problem has been gradually worsening. The maximum temperature recorded prior to her arrival was 101 - 101.9 F. The fever has been present for 3 to 4 days. Associated symptoms include abdominal pain, coughing, diarrhea, headaches, joint pain (body aches), nausea, rhinorrhea and sneezing. Pertinent negatives include no chest pain, congestion, dysuria, ear pain, plugged ear sensation, sinus pain, sore throat, swollen glands, vomiting or wheezing. She has tried acetaminophen for the symptoms. The treatment provided moderate relief.      Review of Systems  HENT: Positive for rhinorrhea and sneezing. Negative for congestion, ear pain and sore throat.   Respiratory: Positive for cough and shortness of breath. Negative for wheezing.   Cardiovascular: Negative for chest pain.  Gastrointestinal: Positive for nausea, abdominal pain and diarrhea. Negative for vomiting.  Genitourinary: Negative for dysuria.  Musculoskeletal: Positive for joint pain (body aches).  Neurological: Positive for headaches.   BP 100/70 mmHg  Pulse 104  Temp(Src) 101.2 F (38.4 C) (Oral)  Resp 16  Wt 225 lb (102.059 kg)  SpO2 100%  LMP 08/01/2015   Patient Active Problem List   Diagnosis Date Noted  . Muscle spasm 07/07/2015  . Acute anxiety 07/07/2015  . Abnormal EKG 01/26/2015  . Sleep apnea 01/26/2015  . 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 Umbilical hernia without obstruction or gangrene 01/26/2015  . Cervicalgia 01/26/2015  . Pain in joint, forearm 01/26/2015  . Papanicolaou smear of cervix with low grade squamous intraepithelial lesion (LGSIL) 01/26/2015  . Premenopausal menorrhagia 01/26/2015  . Irregular menses 01/26/2015  . Essential hypertension, benign 01/26/2015  . Kidney disease, chronic, stage V (end stage, EGFR <  15 ml/min) ([place]) 01/26/2015  . Fatigue 01/26/2015  . Skin photosensitivity 01/26/2015  . Rhinitis, allergic 01/26/2015  . Calf pain 01/26/2015  . Piriformis muscle pain 01/26/2015  . Anemia in CKD (chronic kidney disease) 01/26/2015  . Knee pain 01/26/2015  . Vitamin D deficiency 01/26/2015   Past Medical History  Diagnosis Date  . Migraine   . Hypertension   . Renal disorder   . Allergy   . Anxiety    Current Outpatient Prescriptions on File Prior to Visit  Medication Sig  . ALPRAZolam (XANAX) 0.5 MG tablet Take 1 tablet (0.5 mg total) by mouth 2 (two) times daily as needed for anxiety.  Marland Kitchen amLODipine (NORVASC) 10 MG tablet Take 10 mg by mouth daily.  . cyclobenzaprine (FLEXERIL) 5 MG tablet Take 1 tablet (5 mg total) by mouth 3 (three) times daily as needed for muscle spasms.  . fluticasone (FLONASE) 50 MCG/ACT nasal spray 1 spray by Each Nare route Two (2) times a day.  . metoprolol succinate (TOPROL-XL) 50 MG 24 hr tablet Take 50 mg by mouth daily. Take with or immediately following a meal.  . oxyCODONE (OXY IR/ROXICODONE) 5 MG immediate release tablet Take 5 mg by mouth every 4 (four) hours as needed for severe pain.   Current Facility-Administered Medications on File Prior to Visit  Medication  . hepatitis B vaccine (ENGERIX-B) injection 20 mcg   Allergies  Allergen Reactions  . Dye Fdc Red [Red Dye] Other (See Comments)    Kidney failure  . Nsaids     Shoots creatine up  . Ace Inhibitors Other (See Comments)  Uncontrollable Coughing & Choking  . Alcohol Itching    Hard liquor (Vodka)   Past Surgical History  Procedure Laterality Date  . Breast reduction surgery Bilateral   . Reduction mammaplasty  1998   Social History   Social History  . Marital Status: Single    Spouse Name: N/A  . Number of Children: N/A  . Years of Education: N/A   Occupational History  . Not on file.   Social History Main Topics  . Smoking status: Never Smoker   . Smokeless  tobacco: Never Used  . Alcohol Use: No  . Drug Use: No  . Sexual Activity: Not on file   Other Topics Concern  . Not on file   Social History Narrative   Family History  Problem Relation Age of Onset  . Cancer Mother     breast   . Kidney disease Mother   . Breast cancer Mother 68       Objective:   Physical Exam  Constitutional: She appears well-developed and well-nourished.  HENT:  Head: Normocephalic and atraumatic.  Right Ear: External ear normal.  Left Ear: External ear normal.  Nose: Nose normal.  Mouth/Throat: Oropharynx is clear and moist.  Neck: Normal range of motion. Neck supple. No thyromegaly present.  Cardiovascular: Regular rhythm.  Tachycardia present.   Pulmonary/Chest: Effort normal and breath sounds normal. No respiratory distress. She has no wheezes.  Abdominal: Soft. Bowel sounds are normal. She exhibits distension. There is tenderness (LLQ around port).  Dialysis port present  Psychiatric: She has a normal mood and affect. Her behavior is normal.  BP 100/70 mmHg  Pulse 104  Temp(Src) 99.2 F (37.3 C) (Oral)  Resp 16  Wt 225 lb (102.059 kg)  SpO2 100%  LMP 08/01/2015    Assessment & Plan:  1. Fever, unspecified fever cause Unclear etiology. Flu negative today. UA negative. Will check CXR and more sensitive flu. Further plan pending these results.   - POCT Influenza A/B - DG Chest 2 View; Future - POCT urinalysis dipstick - CBC with Differential/Platelet - POCT urine pregnancy - Influenza A/B PCR Rfx H1N1    2. Kidney disease, chronic, stage V (end stage, EGFR < 15 ml/min) ([facility]) Currently started peritoneal dialysis. They have added intraperitoneal antibiotics.  Still with fever. Abdominal pain improved.   Patient was seen and examined by Jerrell Belfast, MD, and note scribed by Renaldo Fiddler, CMA. I have reviewed the document for accuracy and completeness and I agree with above. Jerrell Belfast, MD   Margarita Rana, MD

## 2015-08-05 ENCOUNTER — Telehealth: Payer: Self-pay

## 2015-08-05 ENCOUNTER — Ambulatory Visit
Admission: RE | Admit: 2015-08-05 | Discharge: 2015-08-05 | Disposition: A | Discharge status: Home / Self Care | Payer: BC Managed Care – PPO | Source: Ambulatory Visit | Attending: Family Medicine | Admitting: Family Medicine

## 2015-08-05 DIAGNOSIS — R918 Other nonspecific abnormal finding of lung field: Secondary | ICD-10-CM | POA: Insufficient documentation

## 2015-08-05 DIAGNOSIS — R509 Fever, unspecified: Secondary | ICD-10-CM | POA: Insufficient documentation

## 2015-08-05 LAB — CBC WITH DIFFERENTIAL/PLATELET
BASOS ABS: 0 10*3/uL (ref 0.0–0.2)
Basos: 0 %
EOS (ABSOLUTE): 0.4 10*3/uL (ref 0.0–0.4)
Eos: 6 %
HEMOGLOBIN: 10.8 g/dL — AB (ref 11.1–15.9)
Hematocrit: 31.8 % — ABNORMAL LOW (ref 34.0–46.6)
IMMATURE GRANS (ABS): 0 10*3/uL (ref 0.0–0.1)
IMMATURE GRANULOCYTES: 0 %
Lymphocytes Absolute: 0.8 10*3/uL (ref 0.7–3.1)
Lymphs: 15 %
MCH: 33.6 pg — AB (ref 26.6–33.0)
MCHC: 34 g/dL (ref 31.5–35.7)
MCV: 99 fL — AB (ref 79–97)
MONOS ABS: 0.6 10*3/uL (ref 0.1–0.9)
Monocytes: 11 %
Neutrophils Absolute: 3.6 10*3/uL (ref 1.4–7.0)
Neutrophils: 68 %
Platelets: 291 10*3/uL (ref 150–379)
RBC: 3.21 x10E6/uL — AB (ref 3.77–5.28)
RDW: 13.7 % (ref 12.3–15.4)
WBC: 5.4 10*3/uL (ref 3.4–10.8)

## 2015-08-05 LAB — POCT URINALYSIS DIPSTICK
Bilirubin, UA: NEGATIVE
Glucose, UA: NEGATIVE
KETONES UA: NEGATIVE
Leukocytes, UA: NEGATIVE
Nitrite, UA: NEGATIVE
SPEC GRAV UA: 1.02
UROBILINOGEN UA: 0.2
pH, UA: 6

## 2015-08-05 LAB — POCT URINE PREGNANCY: PREG TEST UR: NEGATIVE

## 2015-08-05 NOTE — Telephone Encounter (Signed)
Informed pt. States she is feeling "fine, just tired". Currently has fever of 100.5 after taking 2 Tylenol. Pt states she will be in the office within the hour to drop off urine specimen. Will get CXR done at that time. Allene DillonEmily Drozdowski, CMA

## 2015-08-05 NOTE — Telephone Encounter (Signed)
Patient advised as directed below. Patient verbalized understanding.  

## 2015-08-05 NOTE — Telephone Encounter (Signed)
-----   Message from Lorie PhenixNancy Maloney, MD sent at 08/05/2015  2:10 PM EDT ----- No pneumonia. Maybe mild bronchitis. Would only explain fever if has increased cough and congestion. Thanks.

## 2015-08-05 NOTE — Telephone Encounter (Signed)
-----   Message from Lorie PhenixNancy Maloney, MD sent at 08/05/2015  7:46 AM EDT ----- White count stable.  Flu not back. Please see how patient is doing this am and if she is coming in with urine and to get CXR. Thanks.

## 2015-08-06 ENCOUNTER — Telehealth: Payer: Self-pay

## 2015-08-06 LAB — CBC WITH DIFFERENTIAL/PLATELET

## 2015-08-06 NOTE — Telephone Encounter (Signed)
Patient feels about the same today.  Does get really cold.  Still with fever.  Dialysis makes her extremely cold.  Fever this am.  Now with fever of 99.4.   Will go to ER if fever spikes again. Thanks.

## 2015-08-06 NOTE — Telephone Encounter (Signed)
PCR flu test has a 6 day turn around, and the test is only rum on Monday, Wednesday and Friday during 2nd shift per [employer]. Pt still running fevers, up to 102. Currently is 100.7 after taking Tylenol. Pt still c/o fatigue. Also has H/A which is a 3-4 out of 10 on a pain scale, and started after the fevers (x 1-2 days). Pt's neck is also sore. Has a dry, intermittent, mild cough that started today. Fever has lasted 5 days total. Occasional ABD pain (pt had a hard time explaining her ABD pain), diarrhea did improve but started again today after eating banana. Has nausea without vomiting. Still receiving peritoneal abx. Fever started 2 weeks after starting dialysis. Pt also noticed tingling and numbness in fingers. Allene DillonEmily Drozdowski, CMA

## 2015-08-07 LAB — INFLUENZA A/B PCR RFX H1N1
Influenza A By PCR: NEGATIVE
Influenza B By PCR: NEGATIVE

## 2015-08-09 ENCOUNTER — Telehealth: Payer: Self-pay

## 2015-08-09 NOTE — Telephone Encounter (Signed)
Pt advised.  She says today she is fever free so far.  Her appetite is returning and is starting to drink more water and Gatorade.  She still feels tired but she thinks she will improve enough to return to work on Wednesday.     Thanks,   -Vernona RiegerLaura

## 2015-08-09 NOTE — Telephone Encounter (Signed)
-----   Message from Lorie PhenixNancy Maloney, MD sent at 08/07/2015  8:28 AM EDT ----- Negative for flu. Please see how patient is doing. Thanks.

## 2015-08-26 ENCOUNTER — Telehealth: Payer: Self-pay | Admitting: Family Medicine

## 2015-08-26 NOTE — Telephone Encounter (Signed)
Pt was discharged [facility] on 08/25/2015 for fever, body aches and blood pressure.  I have scheduled a hospital follow up/MW

## 2015-08-26 NOTE — Telephone Encounter (Signed)
Ok. Please check on patient. Thanks.

## 2015-08-27 NOTE — Telephone Encounter (Signed)
I spoke with Elizabeth Anthony.  She reports improving some day by day.  She says she has had a lot of changes to her dialysis and will bring all of her information with her on Monday.   Thanks,   -Vernona RiegerLaura

## 2015-08-30 ENCOUNTER — Encounter: Payer: Self-pay | Admitting: Family Medicine

## 2015-08-30 ENCOUNTER — Ambulatory Visit (INDEPENDENT_AMBULATORY_CARE_PROVIDER_SITE_OTHER): Payer: BC Managed Care – PPO | Admitting: Family Medicine

## 2015-08-30 VITALS — BP 148/92 | HR 93 | Temp 98.4°F | Resp 16 | Wt 221.0 lb

## 2015-08-30 DIAGNOSIS — N185 Chronic kidney disease, stage 5: Secondary | ICD-10-CM

## 2015-08-30 DIAGNOSIS — D631 Anemia in chronic kidney disease: Secondary | ICD-10-CM | POA: Diagnosis not present

## 2015-08-30 DIAGNOSIS — N186 End stage renal disease: Secondary | ICD-10-CM

## 2015-08-30 DIAGNOSIS — N189 Chronic kidney disease, unspecified: Secondary | ICD-10-CM | POA: Diagnosis not present

## 2015-08-30 NOTE — Progress Notes (Signed)
Patient ID: Elizabeth Anthony, female   DOB: Jan 24, 1971, 45 y.o.   MRN: [id number]       Patient: Elizabeth Anthony Female    DOB: [birth year]   45 y.o.   MRN: [id number] Visit Date: 08/30/2015  Today's Provider: Margarita Rana, MD   Chief Complaint  Patient presents with  . Hospitalization Follow-up   Subjective:    HPI  Follow up Hospitalization  Patient was admitted to [facility] on 08/10/2015 and discharged on 08/25/2015. She was treated for fever and abnormal labs. Treatment for this included IV Vancomycin. She reports excellent compliance with treatment. She reports this condition is Unchanged.  ------------------------------------------------------------------------------------     Allergies  Allergen Reactions  . Dye Fdc Red [Red Dye] Other (See Comments)    Kidney failure  . Nsaids     Shoots creatine up  . Ace Inhibitors Other (See Comments)    Uncontrollable Coughing & Choking  . Alcohol Itching    Hard liquor (Vodka)   Previous Medications   ALPRAZOLAM (XANAX) 0.5 MG TABLET    Take 1 tablet (0.5 mg total) by mouth 2 (two) times daily as needed for anxiety.   AMLODIPINE (NORVASC) 10 MG TABLET    Take 10 mg by mouth daily.   BIOTIN PO    Take 2 drops by mouth. Every 4 hours as needed for dry mouth   CALCITRIOL (ROCALTROL) 0.25 MCG CAPSULE    Take 0.25 mcg by mouth daily. 2 capsules P O  Two times a day   CALCIUM ACETATE (PHOSLO) 667 MG CAPSULE    Take 667 mg by mouth. 2 capsule by mouth 4 times a day   CYCLOBENZAPRINE (FLEXERIL) 5 MG TABLET    Take 1 tablet (5 mg total) by mouth 3 (three) times daily as needed for muscle spasms.   FLUTICASONE (FLONASE) 50 MCG/ACT NASAL SPRAY    1 spray by Each Nare route Two (2) times a day. as needed   GENTAMICIN CREAM (GARAMYCIN) 0.1 %    APPLY TO THE AFFECTED AREA ONCE DAILY   HYDROXYZINE (ATARAX/VISTARIL) 50 MG TABLET    Take 50 mg by mouth 3 (three) times daily as needed.   METOPROLOL SUCCINATE (TOPROL-XL) 50 MG 24 HR TABLET     Take 50 mg by mouth daily. Take with or immediately following a meal.   MICONAZOLE (MICOTIN) 2 % CREAM    Apply 1 application topically 2 (two) times daily.   RENAGEL 400 MG TABLET    Reported on 08/30/2015    Review of Systems  Constitutional: Positive for chills, appetite change and fatigue.  Respiratory: Negative.   Cardiovascular: Positive for leg swelling.    Social History  Substance Use Topics  . Smoking status: Never Smoker   . Smokeless tobacco: Never Used  . Alcohol Use: No   Objective:   BP 148/92 mmHg  Pulse 93  Temp(Src) 98.4 F (36.9 C) (Oral)  Resp 16  Wt 221 lb (100.245 kg)  SpO2 97%  LMP 08/22/2015 (Approximate)  Physical Exam  Constitutional: She is oriented to person, place, and time. She appears well-developed and well-nourished.  Musculoskeletal: She exhibits edema.  Neurological: She is alert and oriented to person, place, and time.  Skin:  Leg skin scaling off.   Psychiatric: She has a normal mood and affect. Her behavior is normal. Judgment and thought content normal.  A little tearful when discussing hospitalization.       Assessment & Plan:     1. 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 Patient may try Xanax for sleep during dialysis.   Wants to try to sleep. Going to try to work and get dialysis at night. Is middle school vice principle.  Peritoneal dialysis did not work. Will call if needs medication adjustment.   2. Anemia in CKD (chronic kidney disease) Had unusual labs in hospital.   Will follow up with rheumatology. Unclear if related to allergic reaction.  Sed rate was remarkably up at that time.  Has been normal in the past.  Was 89 in 2013.     Patient was seen and examined by Jerrell Belfast, MD, and note scribed by Lynford Humphrey, [place].   I have reviewed the document for accuracy and completeness and I agree with above. - Jerrell Belfast, MD  Margarita Rana, MD  [facility]

## 2015-08-31 ENCOUNTER — Other Ambulatory Visit: Payer: Self-pay | Admitting: *Deleted

## 2015-08-31 DIAGNOSIS — N186 End stage renal disease: Secondary | ICD-10-CM

## 2015-08-31 DIAGNOSIS — Z01810 Encounter for preprocedural cardiovascular examination: Secondary | ICD-10-CM

## 2015-09-03 ENCOUNTER — Telehealth: Payer: Self-pay | Admitting: Emergency Medicine

## 2015-09-03 NOTE — Telephone Encounter (Signed)
Pt wants Dr. Santiago Burmaloney's nurse to call her about her disability paper work, she is wanting to switch to short term disability instead of going back to part time in August.     Cb# [phone]

## 2015-09-06 NOTE — Telephone Encounter (Signed)
Pt has called back regarding previous message.  She wants to know the status.  Please advise [phone].  Thanks Fortune Brandsteri

## 2015-09-06 NOTE — Telephone Encounter (Signed)
Would this be okay? Elizabeth Anthony, CMA  

## 2015-09-06 NOTE — Telephone Encounter (Signed)
Ok thanks 

## 2015-09-06 NOTE — Telephone Encounter (Signed)
Advised pt. She will drop off papers tomorrow. Allene DillonEmily Drozdowski, CMA

## 2015-09-10 ENCOUNTER — Encounter: Payer: Self-pay | Admitting: Surgery

## 2015-09-17 ENCOUNTER — Ambulatory Visit (INDEPENDENT_AMBULATORY_CARE_PROVIDER_SITE_OTHER): Payer: BC Managed Care – PPO | Admitting: Family Medicine

## 2015-09-17 ENCOUNTER — Encounter: Payer: Self-pay | Admitting: Family Medicine

## 2015-09-17 VITALS — BP 142/88 | HR 96 | Temp 98.6°F | Resp 16 | Wt 210.0 lb

## 2015-09-17 DIAGNOSIS — I120 Hypertensive chronic kidney disease with stage 5 chronic kidney disease or end stage renal disease: Secondary | ICD-10-CM

## 2015-09-17 DIAGNOSIS — G4700 Insomnia, unspecified: Secondary | ICD-10-CM | POA: Diagnosis not present

## 2015-09-17 DIAGNOSIS — N185 Chronic kidney disease, stage 5: Secondary | ICD-10-CM

## 2015-09-17 DIAGNOSIS — Z7409 Other reduced mobility: Secondary | ICD-10-CM

## 2015-09-17 DIAGNOSIS — N186 End stage renal disease: Secondary | ICD-10-CM

## 2015-09-17 MED ORDER — ZOLPIDEM TARTRATE 5 MG PO TABS: ORAL_TABLET | ORAL | Status: DC

## 2015-09-17 NOTE — Progress Notes (Signed)
Patient ID: Elizabeth Anthony, female   DOB: 1970-09-05, 46 y.o.   MRN: [id number]        Patient: Elizabeth Anthony Female    DOB: 04/10/1971   45 y.o.   MRN: [id number] Visit Date: 09/17/2015  Today's Provider: Lorie Phenix, MD   Chief Complaint  Patient presents with  . Insomnia   Subjective:    Insomnia Primary symptoms: sleep disturbance, difficulty falling asleep.  The current episode started more than one month. The problem has been gradually worsening since onset. How many beverages per day that contain caffeine: 0 - 1.  Past treatments include medication. The treatment provided no relief. PMH includes: hypertension.   Patient has recently started hemodialysis. Did not tolerate peritoneal dialysis. Is trying to get dialysis in the evening, and sleep during that time.  She wants to return to work. Currently unable to work. Has  multiple disability forms that she needs filled out today. Still with a lot of fatigue and markedly decreased endurance at this time.  Xanax did not help her sleep during dialysis. Patient is very motivated to return to work.      Allergies  Allergen Reactions  . Dye Fdc Red [Red Dye] Other (See Comments)    Kidney failure  . Nsaids     Shoots creatine up  . Ace Inhibitors Other (See Comments)    Uncontrollable Coughing & Choking  . Alcohol Itching    Hard liquor (Vodka)  . Vancomycin Rash   Previous Medications   ALPRAZOLAM (XANAX) 0.5 MG TABLET    Take 1 tablet (0.5 mg total) by mouth 2 (two) times daily as needed for anxiety.   AMLODIPINE (NORVASC) 10 MG TABLET    Take 10 mg by mouth daily.   BIOTIN PO    Take 2 drops by mouth. Every 4 hours as needed for dry mouth   CALCITRIOL (ROCALTROL) 0.25 MCG CAPSULE    Take 0.25 mcg by mouth daily. 2 capsules P O  Two times a day   CALCIUM ACETATE (PHOSLO) 667 MG CAPSULE    Take 667 mg by mouth. 2 capsule by mouth 4 times a day   CYCLOBENZAPRINE (FLEXERIL) 5 MG TABLET    Take 1 tablet (5 mg total) by  mouth 3 (three) times daily as needed for muscle spasms.   FLUTICASONE (FLONASE) 50 MCG/ACT NASAL SPRAY    1 spray by Each Nare route Two (2) times a day. as needed   GENTAMICIN CREAM (GARAMYCIN) 0.1 %    APPLY TO THE AFFECTED AREA ONCE DAILY   HYDROXYZINE (ATARAX/VISTARIL) 50 MG TABLET    Take 50 mg by mouth 3 (three) times daily as needed.   METOPROLOL SUCCINATE (TOPROL-XL) 50 MG 24 HR TABLET    Take 50 mg by mouth daily. Take with or immediately following a meal.   MICONAZOLE (MICOTIN) 2 % CREAM    Apply 1 application topically 2 (two) times daily.   MULTIVITAMIN (RENA-VIT) TABS TABLET    Take 1 tablet by mouth at bedtime.   RENAGEL 400 MG TABLET    Reported on 08/30/2015    Review of Systems  Constitutional: Positive for appetite change (Pt does not have much of an appetite. ) and fatigue. Negative for fever, chills, diaphoresis, activity change and unexpected weight change.  Respiratory: Negative.   Cardiovascular: Positive for palpitations. Negative for chest pain and leg swelling.       Feels like her endurance is low.    Gastrointestinal: Negative.   Psychiatric/Behavioral:  Positive for sleep disturbance and decreased concentration (lack of motivation.). Negative for suicidal ideas, hallucinations, behavioral problems, confusion, self-injury, dysphoric mood and agitation. The patient is nervous/anxious and has insomnia. The patient is not hyperactive.     Social History  Substance Use Topics  . Smoking status: Never Smoker   . Smokeless tobacco: Never Used  . Alcohol Use: No   Objective:   BP 142/88 mmHg  Pulse 96  Temp(Src) 98.6 F (37 C) (Oral)  Resp 16  Wt 210 lb (95.255 kg)  LMP 07/19/2015  Physical Exam  Constitutional: She is oriented to person, place, and time. She appears well-developed and well-nourished.  Cardiovascular: Normal rate and regular rhythm.   Neurological: She is alert and oriented to person, place, and time.  Skin: Skin is warm and dry.    Psychiatric: She has a normal mood and affect. Her behavior is normal. Judgment and thought content normal.      Assessment & Plan:     1. Insomnia New problem.   Will start Zolpidem at dialysis.   - zolpidem (AMBIEN) 5 MG tablet; 1/2-1 at bedtime as needed for sleep.  Dispense: 30 tablet; Refill: 1  2. 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   Hopefully with improve how she feels once sleeping. Filled out all of her paper work for disability for work.     3. Decreased mobility and endurance Increase activity as tolerated.  Suspect will improve after has increased sleep and can increase activity.      Patient was seen and examined by Leo GrosserNancy J. Sadiel Mota, MD, and note scribed by Kavin LeechLaura Walsh, CMA.  I have reviewed the document for accuracy and completeness and I agree with above. - Leo GrosserNancy J. Gery Sabedra, MD      Lorie PhenixNancy Glendon Dunwoody, MD  [facility]

## 2015-09-20 ENCOUNTER — Ambulatory Visit (INDEPENDENT_AMBULATORY_CARE_PROVIDER_SITE_OTHER): Payer: BC Managed Care – PPO | Admitting: Surgery

## 2015-09-20 ENCOUNTER — Encounter: Payer: Self-pay | Admitting: Surgery

## 2015-09-20 ENCOUNTER — Ambulatory Visit (HOSPITAL_COMMUNITY)
Admission: RE | Admit: 2015-09-20 | Discharge: 2015-09-20 | Disposition: A | Discharge status: Home / Self Care | Payer: BC Managed Care – PPO | Source: Ambulatory Visit | Attending: Surgery | Admitting: Surgery

## 2015-09-20 ENCOUNTER — Ambulatory Visit (INDEPENDENT_AMBULATORY_CARE_PROVIDER_SITE_OTHER)
Admission: RE | Admit: 2015-09-20 | Discharge: 2015-09-20 | Disposition: A | Discharge status: Home / Self Care | Payer: BC Managed Care – PPO | Source: Ambulatory Visit | Attending: Surgery | Admitting: Surgery

## 2015-09-20 VITALS — BP 141/98 | HR 83 | Ht 65.0 in | Wt 209.3 lb

## 2015-09-20 DIAGNOSIS — N186 End stage renal disease: Secondary | ICD-10-CM

## 2015-09-20 DIAGNOSIS — I120 Hypertensive chronic kidney disease with stage 5 chronic kidney disease or end stage renal disease: Secondary | ICD-10-CM | POA: Insufficient documentation

## 2015-09-20 DIAGNOSIS — Z01810 Encounter for preprocedural cardiovascular examination: Secondary | ICD-10-CM | POA: Insufficient documentation

## 2015-09-20 DIAGNOSIS — Z992 Dependence on renal dialysis: Secondary | ICD-10-CM | POA: Diagnosis not present

## 2015-09-20 NOTE — Progress Notes (Signed)
[facility]  Patient name: Elizabeth Anthony MRN: [id number] DOB: 22-May-1970 Sex: female  REASON FOR VISIT: Permanent dialysis access  HPI: Elizabeth Anthony is a 45 y.o. female, who presents for evaluation for permanent dialysis access. The patient is right-handed. The patient is currently on dialysis via a left IJ tunneled dialysis catheter on Mondays, Wednesdays, and Fridays. She has been on hemodialysis for 3 weeks. Prior to this, she was on peritoneal dialysis for approximately 3 weeks until she had a presumed peritonitis. She was started on vancomycin as treatment at [facility]. It turned out, that she did not have an infection and developed red man syndrome secondary to vancomycin. She was hospitalized for 16 days.  She has a past medical history of hypertension managed on metoprolol and amlodipine. She does not have diabetes. She is not on any blood thinners  Past Medical History  Diagnosis Date  . Migraine   . Hypertension   . Renal disorder   . Allergy   . Anxiety     Family History  Problem Relation Age of Onset  . Cancer Mother     breast   . Kidney disease Mother   . Breast cancer Mother 55    SOCIAL HISTORY: Social History   Social History  . Marital Status: Single    Spouse Name: N/A  . Number of Children: N/A  . Years of Education: N/A   Occupational History  . Not on file.   Social History Main Topics  . Smoking status: Never Smoker   . Smokeless tobacco: Never Used  . Alcohol Use: No  . Drug Use: No  . Sexual Activity: Not on file   Other Topics Concern  . Not on file   Social History Narrative    Allergies  Allergen Reactions  . Dye Fdc Red [Red Dye] Other (See Comments)    Kidney failure  . Nsaids     Shoots creatine up  . Ace Inhibitors Other (See Comments)    Uncontrollable Coughing & Choking  . Alcohol Itching    Hard liquor (Vodka)  . Vancomycin Rash    Current Outpatient Prescriptions  Medication  Sig Dispense Refill  . ALPRAZolam (XANAX) 0.5 MG tablet Take 1 tablet (0.5 mg total) by mouth 2 (two) times daily as needed for anxiety. 20 tablet 0  . amLODipine (NORVASC) 10 MG tablet Take 10 mg by mouth daily.    Marland Kitchen BIOTIN PO Take 2 drops by mouth. Every 4 hours as needed for dry mouth    . calcitRIOL (ROCALTROL) 0.25 MCG capsule Take 0.25 mcg by mouth daily. 2 capsules P O  Two times a day    . calcium acetate (PHOSLO) 667 MG capsule Take 667 mg by mouth. 2 capsule by mouth 4 times a day    . fluticasone (FLONASE) 50 MCG/ACT nasal spray 1 spray by Each Nare route Two (2) times a day. as needed    . gentamicin cream (GARAMYCIN) 0.1 % APPLY TO THE AFFECTED AREA ONCE DAILY  99  . hydrOXYzine (ATARAX/VISTARIL) 50 MG tablet Take 50 mg by mouth 3 (three) times daily as needed.    . metoprolol succinate (TOPROL-XL) 50 MG 24 hr tablet Take 50 mg by mouth daily. Take with or immediately following a meal.    . miconazole (MICOTIN) 2 % cream Apply 1 application topically 2 (two) times daily.    . multivitamin (RENA-VIT) TABS tablet Take 1 tablet by mouth at bedtime.  11  .  RENAGEL 400 MG tablet Reported on 08/30/2015  11  . zolpidem (AMBIEN) 5 MG tablet 1/2-1 at bedtime as needed for sleep. 30 tablet 1  . cyclobenzaprine (FLEXERIL) 5 MG tablet Take 1 tablet (5 mg total) by mouth 3 (three) times daily as needed for muscle spasms. (Patient not taking: Reported on 08/30/2015) 90 tablet 1   Current Facility-Administered Medications  Medication Dose Route Frequency Provider Last Rate Last Dose  . hepatitis B vaccine (ENGERIX-B) injection 20 mcg  1 mL Intramuscular Once Lorie PhenixNancy Maloney, MD        REVIEW OF SYSTEMS:  [X]  denotes positive finding, [ ]  denotes negative finding Cardiac  Comments:  Chest pain or chest pressure:    Shortness of breath upon exertion:    Short of breath when lying flat:    Irregular heart rhythm:        Vascular    Pain in calf, thigh, or hip brought on by ambulation:    Pain  in feet at night that wakes you up from your sleep:     Blood clot in your veins:    Leg swelling:         Pulmonary    Oxygen at home:    Productive cough:     Wheezing:         Neurologic    Sudden weakness in arms or legs:     Sudden numbness in arms or legs:     Sudden onset of difficulty speaking or slurred speech:    Temporary loss of vision in one eye:     Problems with dizziness:         Gastrointestinal    Blood in stool:     Vomited blood:         Genitourinary    Burning when urinating:     Blood in urine:        Psychiatric    Major depression:         Hematologic    Bleeding problems:    Problems with blood clotting too easily:        Skin    Rashes or ulcers:        Constitutional    Fever or chills:      PHYSICAL EXAM: Filed Vitals:   09/20/15 1602  BP: 141/98  Pulse: 83  Height: 5\' 5"  (1.651 m)  Weight: 209 lb 4.8 oz (94.938 kg)  SpO2: 100%    GENERAL: The patient is a well-nourished female, in no acute distress. The vital signs are documented above. CARDIAC: There is a regular rate and rhythm. No carotid bruits. Left IJ TDC VASCULAR: 2+ radial and brachial pulses bilaterally. PULMONARY: There is good air exchange bilaterally without wheezing or rales. MUSCULOSKELETAL: There are no major deformities or cyanosis. NEUROLOGIC: No focal weakness or paresthesias are detected. SKIN: There are no ulcers or rashes noted. PSYCHIATRIC: The patient has a normal affect.  DATA:  Upper extremity vein mapping 09/20/2015  Small left cephalic vein conduits. Adequate left basilic vein with diameter of 0.30 cm at antecubital fossa and 0.43 cm at the distal upper arm. The proximal upper arm was not studied.  On the right, the cephalic vein is marginal. The basilic vein is smaller than the left but adequate with diameter 0.28 cm at the antecubital space and 0.36 cm at the distal upper arm. The basilic vein was not studied at the mid upper nor proximal upper  arm.  MEDICAL ISSUES: End-stage renal disease  The patient is currently on dialysis via a left IJ tunneled dialysis catheter on Mondays, Wednesdays and Fridays. She is right-handed. She has an adequate left basilic vein for fistula creation. Plan for left first stage basilic vein transposition on 09/30/2015. The patient has a banquet that evening and prefers to be lightly sedated. We will schedule her as the second case.    Maris Berger, PA-C [facility] [place] [phone]   I agree with the above.  Briefly, this is a right-handed female who has been on dialysis via peritoneal.  She has now converted to hemodialysis.  She comes in today for fistula creation.  After reviewing her vein mapping, I feel the best option is a left basilic vein procedure.  I discussed doing this in 2 stages.  We discussed the risks and benefits of the procedure including the risk of non-maturity, steal, and the need for a second stage procedure.  This is been scheduled for May 18  Wells Brabham

## 2015-09-21 ENCOUNTER — Other Ambulatory Visit: Payer: Self-pay

## 2015-09-29 ENCOUNTER — Encounter (HOSPITAL_COMMUNITY): Payer: Self-pay | Admitting: *Deleted

## 2015-09-29 MED ORDER — SODIUM CHLORIDE 0.9 % IV SOLN
INTRAVENOUS | Status: DC
  Administered 2015-09-30 (×2): via INTRAVENOUS

## 2015-09-29 MED ORDER — DEXTROSE 5 % IV SOLN
1.5000 g | INTRAVENOUS | Status: AC
  Administered 2015-09-30: 1.5 g via INTRAVENOUS
  Filled 2015-09-29: qty 1.5

## 2015-09-29 NOTE — Progress Notes (Addendum)
Pt denies cardiac history, chest pain or sob.  Echo - 08/16/15 - in Care Everywhere EKG - 08/14/15 - in Care Everywhere (have requested tracing)

## 2015-09-30 ENCOUNTER — Ambulatory Visit (HOSPITAL_COMMUNITY): Payer: BC Managed Care – PPO | Admitting: Anesthesiology

## 2015-09-30 ENCOUNTER — Other Ambulatory Visit: Payer: Self-pay | Admitting: *Deleted

## 2015-09-30 ENCOUNTER — Encounter (HOSPITAL_COMMUNITY)
Admission: RE | Disposition: A | Discharge status: Home / Self Care | Payer: Self-pay | Source: Ambulatory Visit | Attending: Surgery

## 2015-09-30 ENCOUNTER — Encounter (HOSPITAL_COMMUNITY): Payer: Self-pay | Admitting: *Deleted

## 2015-09-30 ENCOUNTER — Ambulatory Visit (HOSPITAL_COMMUNITY)
Admission: RE | Admit: 2015-09-30 | Discharge: 2015-09-30 | Disposition: A | Discharge status: Home / Self Care | Payer: BC Managed Care – PPO | Source: Ambulatory Visit | Attending: Surgery | Admitting: Surgery

## 2015-09-30 DIAGNOSIS — Z4931 Encounter for adequacy testing for hemodialysis: Secondary | ICD-10-CM

## 2015-09-30 DIAGNOSIS — Z992 Dependence on renal dialysis: Secondary | ICD-10-CM | POA: Insufficient documentation

## 2015-09-30 DIAGNOSIS — N186 End stage renal disease: Secondary | ICD-10-CM

## 2015-09-30 DIAGNOSIS — G4730 Sleep apnea, unspecified: Secondary | ICD-10-CM | POA: Insufficient documentation

## 2015-09-30 DIAGNOSIS — N185 Chronic kidney disease, stage 5: Secondary | ICD-10-CM | POA: Diagnosis not present

## 2015-09-30 DIAGNOSIS — Z79899 Other long term (current) drug therapy: Secondary | ICD-10-CM | POA: Insufficient documentation

## 2015-09-30 DIAGNOSIS — F419 Anxiety disorder, unspecified: Secondary | ICD-10-CM | POA: Diagnosis not present

## 2015-09-30 DIAGNOSIS — I120 Hypertensive chronic kidney disease with stage 5 chronic kidney disease or end stage renal disease: Secondary | ICD-10-CM | POA: Diagnosis present

## 2015-09-30 HISTORY — DX: Sleep apnea, unspecified: G47.30

## 2015-09-30 HISTORY — DX: Anemia, unspecified: D64.9

## 2015-09-30 HISTORY — PX: BASCILIC VEIN TRANSPOSITION: SHX5742

## 2015-09-30 HISTORY — DX: Nausea with vomiting, unspecified: R11.2

## 2015-09-30 HISTORY — DX: Other specified postprocedural states: Z98.890

## 2015-09-30 LAB — POCT I-STAT 4, (NA,K, GLUC, HGB,HCT)
GLUCOSE: 98 mg/dL (ref 65–99)
HCT: 35 % — ABNORMAL LOW (ref 36.0–46.0)
HEMOGLOBIN: 11.9 g/dL — AB (ref 12.0–15.0)
POTASSIUM: 4.7 mmol/L (ref 3.5–5.1)
Sodium: 133 mmol/L — ABNORMAL LOW (ref 135–145)

## 2015-09-30 LAB — HCG, SERUM, QUALITATIVE: Preg, Serum: NEGATIVE

## 2015-09-30 SURGERY — TRANSPOSITION, VEIN, BASILIC
Anesthesia: General | Site: Arm Upper | Laterality: Left

## 2015-09-30 MED ORDER — HEPARIN SODIUM (PORCINE) 1000 UNIT/ML IJ SOLN
INTRAMUSCULAR | Status: DC | PRN
  Administered 2015-09-30: 3000 [IU] via INTRAVENOUS

## 2015-09-30 MED ORDER — MEPERIDINE HCL 25 MG/ML IJ SOLN: 6.2500 mg | INTRAMUSCULAR | Status: DC | PRN

## 2015-09-30 MED ORDER — HYDROMORPHONE HCL 1 MG/ML IJ SOLN: 0.2500 mg | INTRAMUSCULAR | Status: DC | PRN

## 2015-09-30 MED ORDER — FENTANYL CITRATE (PF) 250 MCG/5ML IJ SOLN
INTRAMUSCULAR | Status: AC
  Filled 2015-09-30: qty 5

## 2015-09-30 MED ORDER — 0.9 % SODIUM CHLORIDE (POUR BTL) OPTIME
TOPICAL | Status: DC | PRN
  Administered 2015-09-30: 1000 mL

## 2015-09-30 MED ORDER — MIDAZOLAM HCL 5 MG/5ML IJ SOLN
INTRAMUSCULAR | Status: DC | PRN
  Administered 2015-09-30: 2 mg via INTRAVENOUS

## 2015-09-30 MED ORDER — PROPOFOL 10 MG/ML IV BOLUS
INTRAVENOUS | Status: DC | PRN
  Administered 2015-09-30: 220 mg via INTRAVENOUS

## 2015-09-30 MED ORDER — ONDANSETRON HCL 4 MG/2ML IJ SOLN
INTRAMUSCULAR | Status: DC | PRN
  Administered 2015-09-30: 4 mg via INTRAVENOUS

## 2015-09-30 MED ORDER — LIDOCAINE HCL (CARDIAC) 20 MG/ML IV SOLN
INTRAVENOUS | Status: DC | PRN
  Administered 2015-09-30: 100 mg via INTRAVENOUS

## 2015-09-30 MED ORDER — PROPOFOL 10 MG/ML IV BOLUS
INTRAVENOUS | Status: AC
  Filled 2015-09-30: qty 20

## 2015-09-30 MED ORDER — OXYCODONE-ACETAMINOPHEN 5-325 MG PO TABS: 1.0000 | ORAL_TABLET | Freq: Four times a day (QID) | ORAL | Status: DC | PRN

## 2015-09-30 MED ORDER — CHLORHEXIDINE GLUCONATE CLOTH 2 % EX PADS: 6.0000 | MEDICATED_PAD | Freq: Once | CUTANEOUS | Status: DC

## 2015-09-30 MED ORDER — MIDAZOLAM HCL 2 MG/2ML IJ SOLN
INTRAMUSCULAR | Status: AC
  Filled 2015-09-30: qty 2

## 2015-09-30 MED ORDER — SODIUM CHLORIDE 0.9 % IV SOLN
INTRAVENOUS | Status: DC | PRN
  Administered 2015-09-30: 500 mL

## 2015-09-30 MED ORDER — HEMOSTATIC AGENTS (NO CHARGE) OPTIME
TOPICAL | Status: DC | PRN
Start: 2015-09-30 — End: 2015-09-30
  Administered 2015-09-30: 1 via TOPICAL

## 2015-09-30 MED ORDER — LIDOCAINE-EPINEPHRINE (PF) 1 %-1:200000 IJ SOLN
INTRAMUSCULAR | Status: AC
  Filled 2015-09-30: qty 30

## 2015-09-30 MED ORDER — FENTANYL CITRATE (PF) 100 MCG/2ML IJ SOLN
INTRAMUSCULAR | Status: DC | PRN
  Administered 2015-09-30: 50 ug via INTRAVENOUS
  Administered 2015-09-30: 100 ug via INTRAVENOUS

## 2015-09-30 MED ORDER — PHENYLEPHRINE HCL 10 MG/ML IJ SOLN
INTRAMUSCULAR | Status: DC | PRN
Start: 2015-09-30 — End: 2015-09-30
  Administered 2015-09-30 (×3): 80 ug via INTRAVENOUS

## 2015-09-30 MED ORDER — DEXTROSE 5 % IV SOLN
10.0000 mg | INTRAVENOUS | Status: DC | PRN
  Administered 2015-09-30: 20 ug/min via INTRAVENOUS

## 2015-09-30 MED ORDER — PROTAMINE SULFATE 10 MG/ML IV SOLN
INTRAVENOUS | Status: DC | PRN
  Administered 2015-09-30 (×2): 20 mg via INTRAVENOUS
  Administered 2015-09-30: 10 mg via INTRAVENOUS

## 2015-09-30 SURGICAL SUPPLY — 37 items
CANISTER SUCTION 2500CC (MISCELLANEOUS) ×3 IMPLANT
CLIP TI MEDIUM 24 (CLIP) IMPLANT
CLIP TI MEDIUM 6 (CLIP) ×3 IMPLANT
CLIP TI WIDE RED SMALL 24 (CLIP) IMPLANT
CLIP TI WIDE RED SMALL 6 (CLIP) ×3 IMPLANT
COVER PROBE W GEL 5X96 (DRAPES) ×3 IMPLANT
ELECT CAUTERY BLADE 6.4 (BLADE) ×3 IMPLANT
ELECT REM PT RETURN 9FT ADLT (ELECTROSURGICAL) ×3
ELECTRODE REM PT RTRN 9FT ADLT (ELECTROSURGICAL) ×1 IMPLANT
GLOVE BIO SURGEON STRL SZ 6.5 (GLOVE) ×6 IMPLANT
GLOVE BIO SURGEONS STRL SZ 6.5 (GLOVE) ×3
GLOVE BIOGEL PI IND STRL 6.5 (GLOVE) ×2 IMPLANT
GLOVE BIOGEL PI IND STRL 7.0 (GLOVE) ×1 IMPLANT
GLOVE BIOGEL PI IND STRL 7.5 (GLOVE) ×1 IMPLANT
GLOVE BIOGEL PI INDICATOR 6.5 (GLOVE) ×4
GLOVE BIOGEL PI INDICATOR 7.0 (GLOVE) ×2
GLOVE BIOGEL PI INDICATOR 7.5 (GLOVE) ×2
GLOVE SURG SS PI 7.5 STRL IVOR (GLOVE) ×3 IMPLANT
GOWN STRL REUS W/ TWL LRG LVL3 (GOWN DISPOSABLE) ×3 IMPLANT
GOWN STRL REUS W/ TWL XL LVL3 (GOWN DISPOSABLE) ×1 IMPLANT
GOWN STRL REUS W/TWL LRG LVL3 (GOWN DISPOSABLE) ×6
GOWN STRL REUS W/TWL XL LVL3 (GOWN DISPOSABLE) ×2
HEMOSTAT SNOW SURGICEL 2X4 (HEMOSTASIS) ×3 IMPLANT
KIT BASIN OR (CUSTOM PROCEDURE TRAY) ×3 IMPLANT
KIT ROOM TURNOVER OR (KITS) ×3 IMPLANT
LIQUID BAND (GAUZE/BANDAGES/DRESSINGS) ×3 IMPLANT
NS IRRIG 1000ML POUR BTL (IV SOLUTION) ×3 IMPLANT
PACK CV ACCESS (CUSTOM PROCEDURE TRAY) ×3 IMPLANT
PAD ARMBOARD 7.5X6 YLW CONV (MISCELLANEOUS) ×6 IMPLANT
PENCIL BUTTON HOLSTER BLD 10FT (ELECTRODE) ×3 IMPLANT
SUT PROLENE 6 0 CC (SUTURE) ×3 IMPLANT
SUT SILK 2 0 SH (SUTURE) ×3 IMPLANT
SUT VIC AB 3-0 SH 27 (SUTURE) ×2
SUT VIC AB 3-0 SH 27X BRD (SUTURE) ×1 IMPLANT
SUT VICRYL 4-0 PS2 18IN ABS (SUTURE) ×3 IMPLANT
UNDERPAD 30X30 INCONTINENT (UNDERPADS AND DIAPERS) ×3 IMPLANT
WATER STERILE IRR 1000ML POUR (IV SOLUTION) ×3 IMPLANT

## 2015-09-30 NOTE — Progress Notes (Signed)
Pt has pre existing left upper chest Diatek, dsg CDI , both ports capped & clamped. HD access record faxed to center.

## 2015-09-30 NOTE — Anesthesia Preprocedure Evaluation (Addendum)
Anesthesia Evaluation  Patient identified by MRN, date of birth, ID band Patient awake    Reviewed: Allergy & Precautions, NPO status , Patient's Chart, lab work & pertinent test results  History of Anesthesia Complications (+) PONV and history of anesthetic complications  Airway Mallampati: II  TM Distance: >3 FB Neck ROM: Full    Dental no notable dental hx.    Pulmonary sleep apnea ,    Pulmonary exam normal breath sounds clear to auscultation       Cardiovascular hypertension, Pt. on medications Normal cardiovascular exam Rhythm:Regular Rate:Normal     Neuro/Psych  Headaches, PSYCHIATRIC DISORDERS Anxiety    GI/Hepatic negative GI ROS, Neg liver ROS,   Endo/Other  negative endocrine ROS  Renal/GU ESRFRenal disease     Musculoskeletal negative musculoskeletal ROS (+)   Abdominal   Peds  Hematology negative hematology ROS (+) anemia ,   Anesthesia Other Findings   Reproductive/Obstetrics negative OB ROS                            Anesthesia Physical Anesthesia Plan  ASA: III  Anesthesia Plan: General   Post-op Pain Management:    Induction: Intravenous  Airway Management Planned: LMA  Additional Equipment:   Intra-op Plan:   Post-operative Plan: Extubation in OR  Informed Consent: I have reviewed the patients History and Physical, chart, labs and discussed the procedure including the risks, benefits and alternatives for the proposed anesthesia with the patient or authorized representative who has indicated his/her understanding and acceptance.   Dental advisory given  Plan Discussed with: CRNA  Anesthesia Plan Comments:        Anesthesia Quick Evaluation

## 2015-09-30 NOTE — Transfer of Care (Signed)
Immediate Anesthesia Transfer of Care Note  Patient: Elizabeth Anthony  Procedure(s) Performed: Procedure(s): LEFT ARM FIRST STAGE BASILIC VEIN TRANSPOSITION (Left)  Patient Location: PACU  Anesthesia Type:General  Level of Consciousness: awake, alert  and oriented  Airway & Oxygen Therapy: Patient Spontanous Breathing and Patient connected to nasal cannula oxygen  Post-op Assessment: Report given to RN and Post -op Vital signs reviewed and stable  Post vital signs: Reviewed and stable  Last Vitals:  Filed Vitals:   09/30/15 0823 09/30/15 1128  BP: 123/85   Pulse: 93   Temp: 36.5 C 36.4 C  Resp: 16     Last Pain: There were no vitals filed for this visit.    Patients Stated Pain Goal: 3 (09/30/15 0841)  Complications: No apparent anesthesia complications

## 2015-09-30 NOTE — Op Note (Signed)
    Patient name: Elizabeth Anthony MRN: [id number] DOB: 1970-07-17 Sex: female  09/30/2015 Pre-operative Diagnosis: end-stage renal disease Post-operative diagnosis:  Same Surgeon:  Durene CalBrabham, Wells Assistants:  Karsten RoKim Trinh Procedure:    First stage left basilic vein transposition Anesthesia:  Gen. Blood Loss:  See anesthesia record Specimens:  none  Findings:  Brachial artery was approximately 3 mm and disease free.  The  Basilic vein measured approximately 3.5 mm.  I used a branch for the anastomosis.  Indications:  The patient comes in for renal access.  She has a catheter in place.  Vein mapping showed an adequate left basilic vein we discussed doing this in 2 procedures.  Procedure:  The patient was identified in the holding area and [facility]  The patient was then placed supine on the table. general anesthesia was administered.  The patient was prepped and draped in the usual sterile fashion.  A time out was called and antibiotics were administered.  Ultrasound was used to map the course of basilic vein in the upper arm.  It was adequate for fistula creation.  The cephalic vein was not.   I made an oblique incision at the antecubital crease.  I first dissected out the brachial artery.  This was a 3-1/2 mm artery without significant disease.  I then identified the basilic vein.  I dissected this down to a branch point.  The medial branch was used.  It was marked for orientation.   3000 units of heparin were given.  A right angle clamp was placed on the distal basilic vein which was then ligated.  It distended nicely with heparinized saline.  Next, the brachial artery was occluded with vascular clamps.  A #11 blade was used to make an arteriotomy which was extended longitudinally with Potts scissors.  The vein was then cut the appropriate length and then spatulated to fit the size of the arteriotomy.  A running anastomosis was created with 6-0 Prolene.  Prior to completion, the  appropriate flushing maneuvers were performed and the anastomosis was completed.  I inspected the course of the vein to make sure there were no twists or kinks.  The patient had brisk radial and ulnar Doppler signals and a good thrill within her fistula.  25 mg of protamine was given.  Once hemostasis was satisfactory, the incision was closed with 2 layers of 3-0 Vicryl.  This was followed by Dermabond.  There were no immediate complications   Disposition:  To PACU in stable condition.   Juleen ChinaV. Wells Brabham, M.D. [facility] [place] Office: [phone] Pager:  [phone]

## 2015-09-30 NOTE — Anesthesia Procedure Notes (Signed)
Procedure Name: LMA Insertion Date/Time: 09/30/2015 10:21 AM Performed by: Marena ChancyBECKNER, Nanami Whitelaw S Pre-anesthesia Checklist: Patient identified, Emergency Drugs available, Suction available, Patient being monitored and Timeout performed Patient Re-evaluated:Patient Re-evaluated prior to inductionOxygen Delivery Method: Circle system utilized Preoxygenation: Pre-oxygenation with 100% oxygen Intubation Type: IV induction LMA: LMA inserted LMA Size: 4.0 Number of attempts: 1 Placement Confirmation: positive ETCO2 and breath sounds checked- equal and bilateral Tube secured with: Tape Dental Injury: Teeth and Oropharynx as per pre-operative assessment

## 2015-09-30 NOTE — H&P (View-Only) (Signed)
[facility]ist of [facility]  Patient name: Elizabeth Anthony MRN: [id number] DOB: 22-May-1970 Sex: female  REASON FOR VISIT: Permanent dialysis access  HPI: Elizabeth Anthony is a 45 y.o. female, who presents for evaluation for permanent dialysis access. The patient is right-handed. The patient is currently on dialysis via a left IJ tunneled dialysis catheter on Mondays, Wednesdays, and Fridays. She has been on hemodialysis for 3 weeks. Prior to this, she was on peritoneal dialysis for approximately 3 weeks until she had a presumed peritonitis. She was started on vancomycin as treatment at [facility]. It turned out, that she did not have an infection and developed red man syndrome secondary to vancomycin. She was hospitalized for 16 days.  She has a past medical history of hypertension managed on metoprolol and amlodipine. She does not have diabetes. She is not on any blood thinners  Past Medical History  Diagnosis Date  . Migraine   . Hypertension   . Renal disorder   . Allergy   . Anxiety     Family History  Problem Relation Age of Onset  . Cancer Mother     breast   . Kidney disease Mother   . Breast cancer Mother 55    SOCIAL HISTORY: Social History   Social History  . Marital Status: Single    Spouse Name: N/A  . Number of Children: N/A  . Years of Education: N/A   Occupational History  . Not on file.   Social History Main Topics  . Smoking status: Never Smoker   . Smokeless tobacco: Never Used  . Alcohol Use: No  . Drug Use: No  . Sexual Activity: Not on file   Other Topics Concern  . Not on file   Social History Narrative    Allergies  Allergen Reactions  . Dye Fdc Red [Red Dye] Other (See Comments)    Kidney failure  . Nsaids     Shoots creatine up  . Ace Inhibitors Other (See Comments)    Uncontrollable Coughing & Choking  . Alcohol Itching    Hard liquor (Vodka)  . Vancomycin Rash    Current Outpatient Prescriptions  Medication  Sig Dispense Refill  . ALPRAZolam (XANAX) 0.5 MG tablet Take 1 tablet (0.5 mg total) by mouth 2 (two) times daily as needed for anxiety. 20 tablet 0  . amLODipine (NORVASC) 10 MG tablet Take 10 mg by mouth daily.    Marland Kitchen BIOTIN PO Take 2 drops by mouth. Every 4 hours as needed for dry mouth    . calcitRIOL (ROCALTROL) 0.25 MCG capsule Take 0.25 mcg by mouth daily. 2 capsules P O  Two times a day    . calcium acetate (PHOSLO) 667 MG capsule Take 667 mg by mouth. 2 capsule by mouth 4 times a day    . fluticasone (FLONASE) 50 MCG/ACT nasal spray 1 spray by Each Nare route Two (2) times a day. as needed    . gentamicin cream (GARAMYCIN) 0.1 % APPLY TO THE AFFECTED AREA ONCE DAILY  99  . hydrOXYzine (ATARAX/VISTARIL) 50 MG tablet Take 50 mg by mouth 3 (three) times daily as needed.    . metoprolol succinate (TOPROL-XL) 50 MG 24 hr tablet Take 50 mg by mouth daily. Take with or immediately following a meal.    . miconazole (MICOTIN) 2 % cream Apply 1 application topically 2 (two) times daily.    . multivitamin (RENA-VIT) TABS tablet Take 1 tablet by mouth at bedtime.  11  .  RENAGEL 400 MG tablet Reported on 08/30/2015  11  . zolpidem (AMBIEN) 5 MG tablet 1/2-1 at bedtime as needed for sleep. 30 tablet 1  . cyclobenzaprine (FLEXERIL) 5 MG tablet Take 1 tablet (5 mg total) by mouth 3 (three) times daily as needed for muscle spasms. (Patient not taking: Reported on 08/30/2015) 90 tablet 1   Current Facility-Administered Medications  Medication Dose Route Frequency Provider Last Rate Last Dose  . hepatitis B vaccine (ENGERIX-B) injection 20 mcg  1 mL Intramuscular Once Lorie PhenixNancy Maloney, MD        REVIEW OF SYSTEMS:  [X]  denotes positive finding, [ ]  denotes negative finding Cardiac  Comments:  Chest pain or chest pressure:    Shortness of breath upon exertion:    Short of breath when lying flat:    Irregular heart rhythm:        Vascular    Pain in calf, thigh, or hip brought on by ambulation:    Pain  in feet at night that wakes you up from your sleep:     Blood clot in your veins:    Leg swelling:         Pulmonary    Oxygen at home:    Productive cough:     Wheezing:         Neurologic    Sudden weakness in arms or legs:     Sudden numbness in arms or legs:     Sudden onset of difficulty speaking or slurred speech:    Temporary loss of vision in one eye:     Problems with dizziness:         Gastrointestinal    Blood in stool:     Vomited blood:         Genitourinary    Burning when urinating:     Blood in urine:        Psychiatric    Major depression:         Hematologic    Bleeding problems:    Problems with blood clotting too easily:        Skin    Rashes or ulcers:        Constitutional    Fever or chills:      PHYSICAL EXAM: Filed Vitals:   09/20/15 1602  BP: 141/98  Pulse: 83  Height: 5\' 5"  (1.651 m)  Weight: 209 lb 4.8 oz (94.938 kg)  SpO2: 100%    GENERAL: The patient is a well-nourished female, in no acute distress. The vital signs are documented above. CARDIAC: There is a regular rate and rhythm. No carotid bruits. Left IJ TDC VASCULAR: 2+ radial and brachial pulses bilaterally. PULMONARY: There is good air exchange bilaterally without wheezing or rales. MUSCULOSKELETAL: There are no major deformities or cyanosis. NEUROLOGIC: No focal weakness or paresthesias are detected. SKIN: There are no ulcers or rashes noted. PSYCHIATRIC: The patient has a normal affect.  DATA:  Upper extremity vein mapping 09/20/2015  Small left cephalic vein conduits. Adequate left basilic vein with diameter of 0.30 cm at antecubital fossa and 0.43 cm at the distal upper arm. The proximal upper arm was not studied.  On the right, the cephalic vein is marginal. The basilic vein is smaller than the left but adequate with diameter 0.28 cm at the antecubital space and 0.36 cm at the distal upper arm. The basilic vein was not studied at the mid upper nor proximal upper  arm.  MEDICAL ISSUES: End-stage renal disease  The patient is currently on dialysis via a left IJ tunneled dialysis catheter on Mondays, Wednesdays and Fridays. She is right-handed. She has an adequate left basilic vein for fistula creation. Plan for left first stage basilic vein transposition on 09/30/2015. The patient has a banquet that evening and prefers to be lightly sedated. We will schedule her as the second case.    Maris Berger, PA-C [facility] [place] [phone]   I agree with the above.  Briefly, this is a right-handed female who has been on dialysis via peritoneal.  She has now converted to hemodialysis.  She comes in today for fistula creation.  After reviewing her vein mapping, I feel the best option is a left basilic vein procedure.  I discussed doing this in 2 stages.  We discussed the risks and benefits of the procedure including the risk of non-maturity, steal, and the need for a second stage procedure.  This is been scheduled for May 18  Wells Brabham

## 2015-09-30 NOTE — Anesthesia Postprocedure Evaluation (Signed)
Anesthesia Post Note  Patient: Elizabeth Anthony  Procedure(s) Performed: Procedure(s) (LRB): LEFT ARM FIRST STAGE BASILIC VEIN TRANSPOSITION (Left)  Patient location during evaluation: PACU Anesthesia Type: General Level of consciousness: sedated and patient cooperative Pain management: pain level controlled Vital Signs Assessment: post-procedure vital signs reviewed and stable Respiratory status: spontaneous breathing Cardiovascular status: stable Anesthetic complications: no    Last Vitals:  Filed Vitals:   09/30/15 1149 09/30/15 1200  BP: 130/98 126/83  Pulse:  78  Temp:  36.1 C  Resp:  16    Last Pain:  Filed Vitals:   09/30/15 1204  PainSc: 3                  Prestyn Mahn [employer]

## 2015-09-30 NOTE — Progress Notes (Signed)
Report to S. Marcene CorningGregson RN

## 2015-09-30 NOTE — Interval H&P Note (Signed)
History and Physical Interval Note:  09/30/2015 9:43 AM  Elizabeth Anthony  has presented today for surgery, with the diagnosis of End Stage Renal Disease N18.6  The various methods of treatment have been discussed with the patient and family. After consideration of risks, benefits and other options for treatment, the patient has consented to  Procedure(s): FIRST STAGE BASILIC VEIN TRANSPOSITION (Left) as a surgical intervention .  The patient's history has been reviewed, patient examined, no change in status, stable for surgery.  I have reviewed the patient's chart and labs.  Questions were answered to the patient's satisfaction.     Durene CalBrabham, Wells

## 2015-09-30 NOTE — Discharge Instructions (Signed)
° ° °  09/30/2015 Elizabeth Anthony [id number] 10/20/1970  Surgeon(s): Nada LibmanVance W Brabham, MD  Procedure(s): LEFT ARM FIRST STAGE BASILIC VEIN TRANSPOSITION  x Do not stick graft for 12 weeks

## 2015-10-01 ENCOUNTER — Encounter (HOSPITAL_COMMUNITY): Payer: Self-pay | Admitting: Surgery

## 2015-10-04 ENCOUNTER — Telehealth: Payer: Self-pay | Admitting: Surgery

## 2015-10-04 NOTE — Telephone Encounter (Signed)
-----   Message from Sharee PimpleMarilyn K McChesney, RN sent at 09/30/2015 12:40 PM EDT ----- Regarding: schedule   ----- Message -----    From: Raymond GurneyKimberly A Trinh, PA-C    Sent: 09/30/2015  11:16 AM      To: Vvs Charge Pool  S/p left 1st stage BVT 09/30/15  F/u with Dr. Myra GianottiBrabham in 6 weeks with duplex  Thanks Selena BattenKim

## 2015-10-04 NOTE — Telephone Encounter (Signed)
Sched appt 6/26: lab at 9 and MD at 10:15. Lm on hm# to inform pt.

## 2015-10-20 ENCOUNTER — Telehealth: Payer: Self-pay

## 2015-10-20 NOTE — Telephone Encounter (Signed)
Phone call from pt.  C/o "stinging sensation" in left arm, above the incision.  Also stated "there is a soreness in the same area."  Denied any separation of incision, redness, or drainage.  Reported the swelling has gone down.  Stated "there is some warmth in one area."    Denied fever/ chills.  Reported she was at the beach recently, and had lifted up a toddler, and forgot that she shouldn't do so, until it was too late.  Reported she is able to feel a strong thrill.  Advised to continue to monitor for s/s of infection.  Pt. stated she has an appt. with her PCP on 6/9, and will have her check the arm.  Encouraged to call if any worsening of symptoms.  Verb. Understanding.

## 2015-10-22 ENCOUNTER — Encounter: Payer: Self-pay | Admitting: Family Medicine

## 2015-10-22 ENCOUNTER — Ambulatory Visit (INDEPENDENT_AMBULATORY_CARE_PROVIDER_SITE_OTHER): Payer: BC Managed Care – PPO | Admitting: Family Medicine

## 2015-10-22 VITALS — BP 118/82 | HR 92 | Temp 98.2°F | Resp 16 | Wt 206.0 lb

## 2015-10-22 DIAGNOSIS — Z23 Encounter for immunization: Secondary | ICD-10-CM

## 2015-10-22 DIAGNOSIS — G4700 Insomnia, unspecified: Secondary | ICD-10-CM

## 2015-10-22 DIAGNOSIS — N185 Chronic kidney disease, stage 5: Secondary | ICD-10-CM

## 2015-10-22 DIAGNOSIS — N186 End stage renal disease: Secondary | ICD-10-CM

## 2015-10-22 MED ORDER — CETIRIZINE HCL 10 MG PO TABS: 10.0000 mg | ORAL_TABLET | Freq: Every day | ORAL | Status: DC

## 2015-10-22 MED ORDER — TRAZODONE HCL 50 MG PO TABS: 25.0000 mg | ORAL_TABLET | Freq: Every evening | ORAL | Status: DC | PRN

## 2015-10-22 NOTE — Progress Notes (Signed)
Subjective:    Patient ID: Elizabeth Anthony, female    DOB: 06-01-1970, 45 y.o.   MRN: [id number]  Insomnia Primary symptoms: sleep disturbance (due to dialysis), difficulty falling asleep, no somnolence ("I am not sleepy"), frequent awakening, premature morning awakening, no malaise/fatigue.  The current episode started more than one month (FU from 09/17/2015). The problem occurs nightly. The problem has been gradually worsening (pt states she only gets about 3 hours of sleep per night) since onset. Past treatments include medication (Ambien, Xanax, as well as sleep masks, ear plugs). PMH includes: family stress or anxiety.      Review of Systems  Constitutional: Negative for malaise/fatigue.  Psychiatric/Behavioral: Positive for sleep disturbance (due to dialysis). The patient has insomnia.    BP 118/82 mmHg  Pulse 92  Temp(Src) 98.2 F (36.8 C) (Oral)  Resp 16  Wt 206 lb (93.441 kg)   Patient Active Problem List   Diagnosis Date Noted  . Insomnia 09/17/2015  . Muscle spasm 07/07/2015  . Acute anxiety 07/07/2015  . Abnormal EKG 01/26/2015  . Sleep apnea 01/26/2015  . 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 Umbilical hernia without obstruction or gangrene 01/26/2015  . Cervicalgia 01/26/2015  . Pain in joint, forearm 01/26/2015  . Papanicolaou smear of cervix with low grade squamous intraepithelial lesion (LGSIL) 01/26/2015  . Premenopausal menorrhagia 01/26/2015  . Irregular menses 01/26/2015  . Benign hypertension with CKD (chronic kidney disease) stage V ([place]) 01/26/2015  . Kidney disease, chronic, stage V (end stage, EGFR < 15 ml/min) ([place]) 01/26/2015  . Fatigue 01/26/2015  . Skin photosensitivity 01/26/2015  . Rhinitis, allergic 01/26/2015  . Calf pain 01/26/2015  . Piriformis muscle pain 01/26/2015  . Anemia in CKD (chronic kidney disease) 01/26/2015  . Knee pain 01/26/2015  . Vitamin D deficiency 01/26/2015   Past Medical History  Diagnosis Date  . Migraine   .  Hypertension   . Allergy   . Anxiety   . Sleep apnea     uses cpap  . Renal disorder     Dialysis M/W/F  . Anemia   . PONV (postoperative nausea and vomiting)    Current Outpatient Prescriptions on File Prior to Visit  Medication Sig  . calcitRIOL (ROCALTROL) 0.25 MCG capsule Take 0.5 mcg by mouth every Monday, Wednesday, and Friday. Reported on 09/28/2015  . calcium acetate (PHOSLO) 667 MG capsule Take 1,334 mg by mouth 3 (three) times daily with meals.   . fluticasone (FLONASE) 50 MCG/ACT nasal spray Reported on 10/22/2015  . metoprolol succinate (TOPROL-XL) 50 MG 24 hr tablet Take 50 mg by mouth daily. Take with or immediately following a meal.  . multivitamin (RENA-VIT) TABS tablet Take 1 tablet by mouth at bedtime.  . sevelamer (RENAGEL) 400 MG tablet Take 400 mg by mouth 3 (three) times daily with meals.  Marland Kitchen zolpidem (AMBIEN) 5 MG tablet 1/2-1 at bedtime as needed for sleep. (Patient taking differently: Take 2.5-5 mg by mouth at bedtime as needed for sleep. )  . ALPRAZolam (XANAX) 0.5 MG tablet Take 1 tablet (0.5 mg total) by mouth 2 (two) times daily as needed for anxiety. (Patient not taking: Reported on 10/22/2015)  . oxyCODONE-acetaminophen (ROXICET) 5-325 MG tablet Take 1 tablet by mouth every 6 (six) hours as needed. (Patient not taking: Reported on 10/22/2015)   Current Facility-Administered Medications on File Prior to Visit  Medication  . hepatitis B vaccine (ENGERIX-B) injection 20 mcg   Allergies  Allergen Reactions  . Dye Fdc Red [Red Dye]  Other (See Comments)    Kidney failure  . Nsaids     Shoots creatine up  . Ace Inhibitors Other (See Comments)    Uncontrollable Coughing & Choking  . Alcohol Itching    Hard liquor (Vodka)  . Vancomycin Swelling and Rash    Entire skin peeled   Past Surgical History  Procedure Laterality Date  . Breast reduction surgery Bilateral   . Reduction mammaplasty  1998  . Insertion hemodialysis catheter    . Bascilic vein  transposition Left 09/30/2015    Procedure: LEFT ARM FIRST STAGE BASILIC VEIN TRANSPOSITION;  Surgeon: Serafina Mitchell, MD;  Location: [facility] OR;  Service: Vascular;  Laterality: Left;   Social History   Social History  . Marital Status: Single    Spouse Name: N/A  . Number of Children: N/A  . Years of Education: N/A   Occupational History  . Not on file.   Social History Main Topics  . Smoking status: Never Smoker   . Smokeless tobacco: Never Used  . Alcohol Use: No  . Drug Use: No  . Sexual Activity: Not on file   Other Topics Concern  . Not on file   Social History Narrative   Family History  Problem Relation Age of Onset  . Cancer Mother     breast   . Kidney disease Mother   . Breast cancer Mother 56       Objective:   Physical Exam  Constitutional: She is oriented to person, place, and time. She appears well-developed and well-nourished.  Neurological: She is alert and oriented to person, place, and time.  Skin:  Well healing surgical scar on left upper arm.  Palpable thrill above.    Psychiatric: She has a normal mood and affect. Her behavior is normal. Judgment and thought content normal.   BP 118/82 mmHg  Pulse 92  Temp(Src) 98.2 F (36.8 C) (Oral)  Resp 16  Wt 206 lb (93.441 kg)      Assessment & Plan:  1. Insomnia Wants to sleep during dialysis. Will try Trazodone. OK to return to work next week.   2. Kidney disease, chronic, stage V (end stage, EGFR < 15 ml/min) (HCC) Continue dialysis. Medications added as noted. Will also start Zyrtec for itching.    Patient instructed to call back if condition worsens or does not improve.    - traZODone (DESYREL) 50 MG tablet; Take 0.5-1 tablets (25-50 mg total) by mouth at bedtime as needed for sleep.  Dispense: 30 tablet; Refill: 3 - cetirizine (ZYRTEC) 10 MG tablet; Take 1 tablet (10 mg total) by mouth daily.  Dispense: 1 tablet; Refill: 0 - Hepatitis B vaccine adult IM  Patient seen and examined by Jerrell Belfast, MD, and note scribed by Renaldo Fiddler, CMA.  I have reviewed the document for accuracy and completeness and I agree with above. Jerrell Belfast, MD   Margarita Rana, MD

## 2015-11-01 ENCOUNTER — Encounter: Payer: Self-pay | Admitting: Surgery

## 2015-11-08 ENCOUNTER — Ambulatory Visit (INDEPENDENT_AMBULATORY_CARE_PROVIDER_SITE_OTHER): Payer: Self-pay | Admitting: Surgery

## 2015-11-08 ENCOUNTER — Encounter: Payer: Self-pay | Admitting: Surgery

## 2015-11-08 ENCOUNTER — Ambulatory Visit (HOSPITAL_COMMUNITY)
Admission: RE | Admit: 2015-11-08 | Discharge: 2015-11-08 | Disposition: A | Discharge status: Home / Self Care | Payer: BC Managed Care – PPO | Source: Ambulatory Visit | Attending: Surgery | Admitting: Surgery

## 2015-11-08 VITALS — BP 122/78 | HR 92 | Temp 97.5°F | Resp 18 | Ht 64.0 in | Wt 203.0 lb

## 2015-11-08 DIAGNOSIS — I120 Hypertensive chronic kidney disease with stage 5 chronic kidney disease or end stage renal disease: Secondary | ICD-10-CM | POA: Insufficient documentation

## 2015-11-08 DIAGNOSIS — Z992 Dependence on renal dialysis: Secondary | ICD-10-CM

## 2015-11-08 DIAGNOSIS — N186 End stage renal disease: Secondary | ICD-10-CM | POA: Insufficient documentation

## 2015-11-08 DIAGNOSIS — F419 Anxiety disorder, unspecified: Secondary | ICD-10-CM | POA: Insufficient documentation

## 2015-11-08 DIAGNOSIS — Z4931 Encounter for adequacy testing for hemodialysis: Secondary | ICD-10-CM | POA: Insufficient documentation

## 2015-11-08 DIAGNOSIS — G4730 Sleep apnea, unspecified: Secondary | ICD-10-CM | POA: Insufficient documentation

## 2015-11-08 NOTE — Progress Notes (Signed)
   Patient name: Elizabeth Anthony MRN: [id number] DOB: 04/24/1971 Sex: female  REASON FOR VISIT: Post-op  HPI: Elizabeth Anthony is a 45 y.o. female who is s/p left 1st stage BVT on 09-30-2015.  She has done well from this procedure.  She denies any symptoms of steal.  She denies any swelling in her arm   Current Outpatient Prescriptions  Medication Sig Dispense Refill  . ALPRAZolam (XANAX) 0.5 MG tablet Take 1 tablet (0.5 mg total) by mouth 2 (two) times daily as needed for anxiety. 20 tablet 0  . calcitRIOL (ROCALTROL) 0.25 MCG capsule Take 0.5 mcg by mouth every Monday, Wednesday, and Friday. Reported on 09/28/2015    . calcium acetate (PHOSLO) 667 MG capsule Take 1,334 mg by mouth 3 (three) times daily with meals.     . cetirizine (ZYRTEC) 10 MG tablet Take 1 tablet (10 mg total) by mouth daily. 1 tablet 0  . fluticasone (FLONASE) 50 MCG/ACT nasal spray Reported on 10/22/2015    . metoprolol succinate (TOPROL-XL) 50 MG 24 hr tablet Take 50 mg by mouth daily. Take with or immediately following a meal.    . multivitamin (RENA-VIT) TABS tablet Take 1 tablet by mouth at bedtime.  11  . sevelamer (RENAGEL) 400 MG tablet Take 400 mg by mouth 3 (three) times daily with meals.    . traZODone (DESYREL) 50 MG tablet Take 0.5-1 tablets (25-50 mg total) by mouth at bedtime as needed for sleep. (Patient not taking: Reported on 11/08/2015) 30 tablet 3   Current Facility-Administered Medications  Medication Dose Route Frequency Provider Last Rate Last Dose  . hepatitis B vaccine (ENGERIX-B) injection 20 mcg  1 mL Intramuscular Once Lorie PhenixNancy Maloney, MD        REVIEW OF SYSTEMS:  [X]  denotes positive finding, [ ]  denotes negative finding Cardiac  Comments:  Chest pain or chest pressure:    Shortness of breath upon exertion:    Short of breath when lying flat:    Irregular heart rhythm:    Constitutional    Fever or chills:      PHYSICAL EXAM: Filed Vitals:   11/08/15 0941  BP: 122/78  Pulse: 92  Temp: 97.5 F (36.4 C)  TempSrc: Oral  Resp: 18  Height: 5\' 4"  (1.626 m)  Weight: 203 lb (92.08 kg)  SpO2: 98%    GENERAL: The patient is a well-nourished female, in no acute distress. The vital signs are documented above. CARDIOVASCULAR: There is a regular rate and rhythm. PULMONARY: There is good air exchange bilaterally without wheezing or rales. Palpable thrill within fistula  MEDICAL ISSUES: After reviewing her vein study which shows a vein diameter of greater than 0.7 cm, we have decided to proceed with elevation or second stage basilic vein transposition.  The details of the procedure were discussed with the patient.  She has been scheduled for Thursday, July 20.  She would like this done soon or so if a earlier date becomes available we will move her.  Durene CalWells Brabham, MD [facility] [place] Tel [phone] Pager [phone]

## 2015-11-09 ENCOUNTER — Other Ambulatory Visit: Payer: Self-pay

## 2015-11-29 ENCOUNTER — Encounter (HOSPITAL_COMMUNITY): Payer: Self-pay | Admitting: *Deleted

## 2015-11-29 MED ORDER — DEXTROSE 5 % IV SOLN
1.5000 g | INTRAVENOUS | Status: AC
  Administered 2015-11-30: 1.5 g via INTRAVENOUS
  Filled 2015-11-29: qty 1.5

## 2015-11-29 NOTE — Progress Notes (Signed)
Pt denies SOB, chest pain, and being under the care of a cardiologist. Pt denies having a stress test and cardiac cath. Pt made aware to stop otc vitamins, fish oil and herbal medications. Pt verbalized understanding of pre-op instructions.

## 2015-11-30 ENCOUNTER — Encounter (HOSPITAL_COMMUNITY)
Admission: RE | Disposition: A | Discharge status: Home / Self Care | Payer: Self-pay | Source: Ambulatory Visit | Attending: Surgery

## 2015-11-30 ENCOUNTER — Ambulatory Visit (HOSPITAL_COMMUNITY)
Admission: RE | Admit: 2015-11-30 | Discharge: 2015-11-30 | Disposition: A | Discharge status: Home / Self Care | Payer: BC Managed Care – PPO | Source: Ambulatory Visit | Attending: Surgery | Admitting: Surgery

## 2015-11-30 ENCOUNTER — Ambulatory Visit (HOSPITAL_COMMUNITY): Payer: BC Managed Care – PPO | Admitting: Certified Registered"

## 2015-11-30 ENCOUNTER — Encounter (HOSPITAL_COMMUNITY): Payer: Self-pay | Admitting: *Deleted

## 2015-11-30 DIAGNOSIS — N184 Chronic kidney disease, stage 4 (severe): Secondary | ICD-10-CM | POA: Diagnosis not present

## 2015-11-30 DIAGNOSIS — N186 End stage renal disease: Secondary | ICD-10-CM | POA: Diagnosis not present

## 2015-11-30 DIAGNOSIS — I120 Hypertensive chronic kidney disease with stage 5 chronic kidney disease or end stage renal disease: Secondary | ICD-10-CM | POA: Insufficient documentation

## 2015-11-30 HISTORY — PX: BASCILIC VEIN TRANSPOSITION: SHX5742

## 2015-11-30 LAB — POCT I-STAT 4, (NA,K, GLUC, HGB,HCT)
Glucose, Bld: 93 mg/dL (ref 65–99)
HEMATOCRIT: 29 % — AB (ref 36.0–46.0)
Hemoglobin: 9.9 g/dL — ABNORMAL LOW (ref 12.0–15.0)
Potassium: 3.8 mmol/L (ref 3.5–5.1)
Sodium: 135 mmol/L (ref 135–145)

## 2015-11-30 SURGERY — TRANSPOSITION, VEIN, BASILIC
Anesthesia: General | Site: Arm Upper | Laterality: Left

## 2015-11-30 MED ORDER — FENTANYL CITRATE (PF) 250 MCG/5ML IJ SOLN
INTRAMUSCULAR | Status: AC
  Filled 2015-11-30: qty 5

## 2015-11-30 MED ORDER — OXYCODONE-ACETAMINOPHEN 5-325 MG PO TABS: 1.0000 | ORAL_TABLET | Freq: Four times a day (QID) | ORAL | Status: DC | PRN

## 2015-11-30 MED ORDER — PHENYLEPHRINE HCL 10 MG/ML IJ SOLN
10.0000 mg | INTRAVENOUS | Status: DC | PRN
  Administered 2015-11-30: 40 ug/min via INTRAVENOUS

## 2015-11-30 MED ORDER — PHENYLEPHRINE HCL 10 MG/ML IJ SOLN
INTRAMUSCULAR | Status: DC | PRN
  Administered 2015-11-30 (×3): 40 ug via INTRAVENOUS

## 2015-11-30 MED ORDER — LIDOCAINE-EPINEPHRINE (PF) 1 %-1:200000 IJ SOLN
INTRAMUSCULAR | Status: AC
  Filled 2015-11-30: qty 30

## 2015-11-30 MED ORDER — PHENYLEPHRINE 40 MCG/ML (10ML) SYRINGE FOR IV PUSH (FOR BLOOD PRESSURE SUPPORT)
PREFILLED_SYRINGE | INTRAVENOUS | Status: AC
  Filled 2015-11-30: qty 10

## 2015-11-30 MED ORDER — CHLORHEXIDINE GLUCONATE CLOTH 2 % EX PADS: 6.0000 | MEDICATED_PAD | Freq: Once | CUTANEOUS | Status: DC

## 2015-11-30 MED ORDER — DIPHENHYDRAMINE HCL 50 MG/ML IJ SOLN
INTRAMUSCULAR | Status: AC
  Filled 2015-11-30: qty 1

## 2015-11-30 MED ORDER — DEXAMETHASONE SODIUM PHOSPHATE 10 MG/ML IJ SOLN
INTRAMUSCULAR | Status: DC | PRN
  Administered 2015-11-30: 4 mg via INTRAVENOUS

## 2015-11-30 MED ORDER — DIPHENHYDRAMINE HCL 50 MG/ML IJ SOLN
INTRAMUSCULAR | Status: DC | PRN
  Administered 2015-11-30: 10 mg via INTRAVENOUS

## 2015-11-30 MED ORDER — ONDANSETRON HCL 4 MG/2ML IJ SOLN
INTRAMUSCULAR | Status: DC | PRN
  Administered 2015-11-30: 4 mg via INTRAVENOUS

## 2015-11-30 MED ORDER — FENTANYL CITRATE (PF) 250 MCG/5ML IJ SOLN
INTRAMUSCULAR | Status: DC | PRN
  Administered 2015-11-30 (×2): 50 ug via INTRAVENOUS
  Administered 2015-11-30: 25 ug via INTRAVENOUS

## 2015-11-30 MED ORDER — MIDAZOLAM HCL 5 MG/5ML IJ SOLN
INTRAMUSCULAR | Status: DC | PRN
  Administered 2015-11-30: 2 mg via INTRAVENOUS

## 2015-11-30 MED ORDER — DEXAMETHASONE SODIUM PHOSPHATE 10 MG/ML IJ SOLN
INTRAMUSCULAR | Status: AC
  Filled 2015-11-30: qty 1

## 2015-11-30 MED ORDER — SODIUM CHLORIDE 0.9 % IV SOLN
INTRAVENOUS | Status: DC
  Administered 2015-11-30 (×2): via INTRAVENOUS

## 2015-11-30 MED ORDER — PHENYLEPHRINE HCL 10 MG/ML IJ SOLN
INTRAMUSCULAR | Status: AC
  Filled 2015-11-30: qty 1

## 2015-11-30 MED ORDER — ONDANSETRON HCL 4 MG/2ML IJ SOLN: 4.0000 mg | Freq: Once | INTRAMUSCULAR | Status: DC | PRN

## 2015-11-30 MED ORDER — 0.9 % SODIUM CHLORIDE (POUR BTL) OPTIME
TOPICAL | Status: DC | PRN
  Administered 2015-11-30: 1000 mL

## 2015-11-30 MED ORDER — FENTANYL CITRATE (PF) 100 MCG/2ML IJ SOLN: 25.0000 ug | INTRAMUSCULAR | Status: DC | PRN

## 2015-11-30 MED ORDER — SODIUM CHLORIDE 0.9 % IV SOLN: INTRAVENOUS | Status: DC

## 2015-11-30 MED ORDER — SODIUM CHLORIDE 0.9 % IV SOLN
INTRAVENOUS | Status: DC | PRN
  Administered 2015-11-30: 500 mL

## 2015-11-30 MED ORDER — MIDAZOLAM HCL 2 MG/2ML IJ SOLN
INTRAMUSCULAR | Status: AC
  Filled 2015-11-30: qty 2

## 2015-11-30 MED ORDER — ONDANSETRON HCL 4 MG/2ML IJ SOLN
INTRAMUSCULAR | Status: AC
  Filled 2015-11-30: qty 4

## 2015-11-30 MED ORDER — PROPOFOL 10 MG/ML IV BOLUS
INTRAVENOUS | Status: AC
  Filled 2015-11-30: qty 20

## 2015-11-30 MED ORDER — SUGAMMADEX SODIUM 500 MG/5ML IV SOLN
INTRAVENOUS | Status: AC
  Filled 2015-11-30: qty 5

## 2015-11-30 MED ORDER — LIDOCAINE 2% (20 MG/ML) 5 ML SYRINGE
INTRAMUSCULAR | Status: DC | PRN
  Administered 2015-11-30: 30 mg via INTRAVENOUS

## 2015-11-30 MED ORDER — PROPOFOL 10 MG/ML IV BOLUS
INTRAVENOUS | Status: DC | PRN
  Administered 2015-11-30: 165 mg via INTRAVENOUS

## 2015-11-30 SURGICAL SUPPLY — 37 items
CANISTER SUCTION 2500CC (MISCELLANEOUS) ×3 IMPLANT
CLIP TI MEDIUM 24 (CLIP) IMPLANT
CLIP TI MEDIUM 6 (CLIP) ×3 IMPLANT
CLIP TI WIDE RED SMALL 24 (CLIP) IMPLANT
CLIP TI WIDE RED SMALL 6 (CLIP) ×9 IMPLANT
COVER PROBE W GEL 5X96 (DRAPES) ×3 IMPLANT
ELECT REM PT RETURN 9FT ADLT (ELECTROSURGICAL) ×3
ELECTRODE REM PT RTRN 9FT ADLT (ELECTROSURGICAL) ×1 IMPLANT
GLOVE BIO SURGEON STRL SZ 6.5 (GLOVE) ×2 IMPLANT
GLOVE BIO SURGEONS STRL SZ 6.5 (GLOVE) ×1
GLOVE BIOGEL PI IND STRL 6.5 (GLOVE) ×1 IMPLANT
GLOVE BIOGEL PI IND STRL 7.5 (GLOVE) ×1 IMPLANT
GLOVE BIOGEL PI INDICATOR 6.5 (GLOVE) ×2
GLOVE BIOGEL PI INDICATOR 7.5 (GLOVE) ×2
GLOVE SURG SS PI 6.5 STRL IVOR (GLOVE) ×6 IMPLANT
GLOVE SURG SS PI 7.5 STRL IVOR (GLOVE) ×3 IMPLANT
GOWN STRL REUS W/ TWL LRG LVL3 (GOWN DISPOSABLE) ×2 IMPLANT
GOWN STRL REUS W/ TWL XL LVL3 (GOWN DISPOSABLE) ×1 IMPLANT
GOWN STRL REUS W/TWL LRG LVL3 (GOWN DISPOSABLE) ×4
GOWN STRL REUS W/TWL XL LVL3 (GOWN DISPOSABLE) ×2
HEMOSTAT SNOW SURGICEL 2X4 (HEMOSTASIS) IMPLANT
KIT BASIN OR (CUSTOM PROCEDURE TRAY) ×3 IMPLANT
KIT ROOM TURNOVER OR (KITS) ×3 IMPLANT
LIQUID BAND (GAUZE/BANDAGES/DRESSINGS) ×3 IMPLANT
NS IRRIG 1000ML POUR BTL (IV SOLUTION) ×3 IMPLANT
PACK CV ACCESS (CUSTOM PROCEDURE TRAY) ×3 IMPLANT
PAD ARMBOARD 7.5X6 YLW CONV (MISCELLANEOUS) ×6 IMPLANT
SPONGE LAP 18X18 X RAY DECT (DISPOSABLE) ×3 IMPLANT
SUT PROLENE 5 0 C 1 24 (SUTURE) ×18 IMPLANT
SUT PROLENE 6 0 CC (SUTURE) ×3 IMPLANT
SUT SILK 2 0 SH (SUTURE) ×3 IMPLANT
SUT VIC AB 3-0 SH 27 (SUTURE) ×6
SUT VIC AB 3-0 SH 27X BRD (SUTURE) ×3 IMPLANT
SUT VICRYL 4-0 PS2 18IN ABS (SUTURE) ×6 IMPLANT
TOWEL OR 17X24 6PK STRL BLUE (TOWEL DISPOSABLE) ×3 IMPLANT
UNDERPAD 30X30 INCONTINENT (UNDERPADS AND DIAPERS) ×3 IMPLANT
WATER STERILE IRR 1000ML POUR (IV SOLUTION) IMPLANT

## 2015-11-30 NOTE — Anesthesia Preprocedure Evaluation (Signed)
Anesthesia Evaluation  Patient identified by MRN, date of birth, ID band Patient awake    Reviewed: Allergy & Precautions, NPO status , Patient's Chart, lab work & pertinent test results  Airway Mallampati: II  TM Distance: >3 FB Neck ROM: Full    Dental  (+) Teeth Intact, Dental Advisory Given   Pulmonary    breath sounds clear to auscultation       Cardiovascular hypertension,  Rhythm:Regular Rate:Normal     Neuro/Psych    GI/Hepatic   Endo/Other    Renal/GU      Musculoskeletal   Abdominal (+) + obese,   Peds  Hematology   Anesthesia Other Findings   Reproductive/Obstetrics                             Anesthesia Physical Anesthesia Plan  ASA: III  Anesthesia Plan: General   Post-op Pain Management:    Induction: Intravenous  Airway Management Planned: LMA  Additional Equipment:   Intra-op Plan:   Post-operative Plan:   Informed Consent: I have reviewed the patients History and Physical, chart, labs and discussed the procedure including the risks, benefits and alternatives for the proposed anesthesia with the patient or authorized representative who has indicated his/her understanding and acceptance.   Dental advisory given  Plan Discussed with: CRNA and Anesthesiologist  Anesthesia Plan Comments:         Anesthesia Quick Evaluation  

## 2015-11-30 NOTE — Anesthesia Postprocedure Evaluation (Signed)
Anesthesia Post Note  Patient: Zadia Faeth  Procedure(s) Performed: Procedure(s) (LRB): SECOND STAGE BASILIC VEIN TRANSPOSITION, left arm (Left)  Patient location during evaluation: PACU Anesthesia Type: General Level of consciousness: awake, awake and alert and oriented Pain management: pain level controlled Vital Signs Assessment: post-procedure vital signs reviewed and stable Respiratory status: spontaneous breathing, nonlabored ventilation and respiratory function stable Cardiovascular status: blood pressure returned to baseline    Last Vitals:  Filed Vitals:   11/30/15 1426 11/30/15 1456  BP: 119/86 128/90  Pulse: 85 83  Temp:    Resp: 19 18    Last Pain:  Filed Vitals:   11/30/15 1458  PainSc: 5                  Citlally Captain COKER

## 2015-11-30 NOTE — H&P (View-Only) (Signed)
   Patient name: Elizabeth Anthony MRN: [id number] DOB: [birth year] Sex: female  REASON FOR VISIT: Post-op  HPI: Elizabeth Anthony is a 45 y.o. female who is s/p left 1st stage BVT on 09-30-2015.  She has done well from this procedure.  She denies any symptoms of steal.  She denies any swelling in her arm   Current Outpatient Prescriptions  Medication Sig Dispense Refill  . ALPRAZolam (XANAX) 0.5 MG tablet Take 1 tablet (0.5 mg total) by mouth 2 (two) times daily as needed for anxiety. 20 tablet 0  . calcitRIOL (ROCALTROL) 0.25 MCG capsule Take 0.5 mcg by mouth every Monday, Wednesday, and Friday. Reported on 09/28/2015    . calcium acetate (PHOSLO) 667 MG capsule Take 1,334 mg by mouth 3 (three) times daily with meals.     . cetirizine (ZYRTEC) 10 MG tablet Take 1 tablet (10 mg total) by mouth daily. 1 tablet 0  . fluticasone (FLONASE) 50 MCG/ACT nasal spray Reported on 10/22/2015    . metoprolol succinate (TOPROL-XL) 50 MG 24 hr tablet Take 50 mg by mouth daily. Take with or immediately following a meal.    . multivitamin (RENA-VIT) TABS tablet Take 1 tablet by mouth at bedtime.  11  . sevelamer (RENAGEL) 400 MG tablet Take 400 mg by mouth 3 (three) times daily with meals.    . traZODone (DESYREL) 50 MG tablet Take 0.5-1 tablets (25-50 mg total) by mouth at bedtime as needed for sleep. (Patient not taking: Reported on 11/08/2015) 30 tablet 3   Current Facility-Administered Medications  Medication Dose Route Frequency Provider Last Rate Last Dose  . hepatitis B vaccine (ENGERIX-B) injection 20 mcg  1 mL Intramuscular Once Lorie PhenixNancy Maloney, MD        REVIEW OF SYSTEMS:  [X]  denotes positive finding, [ ]  denotes negative finding Cardiac  Comments:  Chest pain or chest pressure:    Shortness of breath upon exertion:    Short of breath when lying flat:    Irregular heart rhythm:    Constitutional    Fever or chills:      PHYSICAL EXAM: Filed Vitals:   11/08/15 0941  BP: 122/78  Pulse: 92  Temp: 97.5 F (36.4 C)  TempSrc: Oral  Resp: 18  Height: 5\' 4"  (1.626 m)  Weight: 203 lb (92.08 kg)  SpO2: 98%    GENERAL: The patient is a well-nourished female, in no acute distress. The vital signs are documented above. CARDIOVASCULAR: There is a regular rate and rhythm. PULMONARY: There is good air exchange bilaterally without wheezing or rales. Palpable thrill within fistula  MEDICAL ISSUES: After reviewing her vein study which shows a vein diameter of greater than 0.7 cm, we have decided to proceed with elevation or second stage basilic vein transposition.  The details of the procedure were discussed with the patient.  She has been scheduled for Thursday, July 20.  She would like this done soon or so if a earlier date becomes available we will move her.  Durene CalWells Brabham, MD [facility] Tel [phone] Pager [phone]

## 2015-11-30 NOTE — Op Note (Signed)
    Patient name: Elizabeth CardHerschelle Medel MRN: [id number] DOB: January 07, 1971 Sex: female  11/30/2015 Pre-operative Diagnosis: End-stage renal disease Post-operative diagnosis:  Same Surgeon:  Durene CalBrabham, Wells Assistants:  Karsten RoKim Trinh Procedure:   Second stage basilic vein transposition with resection and primary anastomosis of the basilic vein Anesthesia:  Gen. Blood Loss:  See anesthesia record Specimens:  None  Findings:  Excellent appearing vein  Indications:  The patient has undergone previous first stage basilic vein transposition.  Preprocedure ultrasound showed a widely patent basilic vein.  She comes in today for elevation  Procedure:  The patient was identified in the holding area and [facility]  The patient was then placed supine on the table. general anesthesia was administered.  The patient was prepped and draped in the usual sterile fashion.  A time out was called and antibiotics were administered.  Ultrasound was used to evaluate the basilic vein which was widely patent.  Through 2 longitudinal incisions, the basilic vein was dissected free, protecting the nerve.  The nerve did appear to wrap around the vein.  Once the vein was fully mobilized, I made the decision to transect it to free it from the constraints of the surrounding nerve.  There was also a little redundant so approximately 1 cm the vein was resected.  I performed primary end to end anastomosis with 5-0 Prolene.  I was unhappy with the initial anastomosis and therefore this one down and redid it.  This time I was satisfied.  There was good hemostasis.  There was an excellent thrill through the vein.  The subcutaneous tissue was reapproximated posterior to the fistula with 3-0 Vicryl suture in an interrupted fashion.  The skin was closed directly anterior to the vein using 4-0 Vicryl.  Dermabond was applied.   Disposition:  To PACU in stable condition.   Juleen ChinaV. Wells Brabham, M.D. [facility]  [place] Office: [phone] Pager:  [phone]

## 2015-11-30 NOTE — Transfer of Care (Signed)
Immediate Anesthesia Transfer of Care Note  Patient: Elizabeth Anthony  Procedure(s) Performed: Procedure(s): SECOND STAGE BASILIC VEIN TRANSPOSITION, left arm (Left)  Patient Location: PACU  Anesthesia Type:General  Level of Consciousness: awake, oriented and patient cooperative  Airway & Oxygen Therapy: Patient Spontanous Breathing and Patient connected to nasal cannula oxygen  Post-op Assessment: Report given to RN, Post -op Vital signs reviewed and stable and Patient moving all extremities  Post vital signs: Reviewed and stable  Last Vitals:  Filed Vitals:   11/30/15 0815  BP: 116/80  Pulse: 91  Temp: 36.8 C  Resp: 18    Last Pain:  Filed Vitals:   11/30/15 1412  PainSc: 1       Patients Stated Pain Goal: 1 (11/30/15 0909)  Complications: No apparent anesthesia complications

## 2015-11-30 NOTE — Interval H&P Note (Signed)
History and Physical Interval Note:  11/30/2015 9:14 AM  Elizabeth Anthony  has presented today for surgery, with the diagnosis of End Stage Renal Disease N18.6  The various methods of treatment have been discussed with the patient and family. After consideration of risks, benefits and other options for treatment, the patient has consented to  Procedure(s): SECOND STAGE BASILIC VEIN TRANSPOSITION (Left) as a surgical intervention .  The patient's history has been reviewed, patient examined, no change in status, stable for surgery.  I have reviewed the patient's chart and labs.  Questions were answered to the patient's satisfaction.     Durene CalBrabham, Wells

## 2015-11-30 NOTE — Anesthesia Procedure Notes (Signed)
Procedure Name: LMA Insertion Date/Time: 11/30/2015 12:13 PM Performed by: Charm BargesBUTLER, Judson Tsan R Pre-anesthesia Checklist: Patient identified, Emergency Drugs available, Suction available and Patient being monitored Patient Re-evaluated:Patient Re-evaluated prior to inductionOxygen Delivery Method: Circle System Utilized Preoxygenation: Pre-oxygenation with 100% oxygen Intubation Type: IV induction Ventilation: Mask ventilation without difficulty LMA: LMA inserted LMA Size: 4.0 Number of attempts: 1 Placement Confirmation: positive ETCO2 Tube secured with: Tape Dental Injury: Teeth and Oropharynx as per pre-operative assessment

## 2015-12-01 ENCOUNTER — Encounter (HOSPITAL_COMMUNITY): Payer: Self-pay | Admitting: Surgery

## 2015-12-02 ENCOUNTER — Telehealth: Payer: Self-pay | Admitting: Surgery

## 2015-12-02 NOTE — Telephone Encounter (Signed)
-----   Message from Sharee PimpleMarilyn K McChesney, RN sent at 11/30/2015  2:47 PM EDT ----- Regarding: schedule   ----- Message -----    From: Raymond GurneyKimberly A Trinh, PA-C    Sent: 11/30/2015   2:05 PM      To: [facility] Charge Pool  S/p left 2nd stage BVT 11/30/15  F/u with [facility] in 3 weeks. No studies.  Thanks Selena BattenKim

## 2015-12-02 NOTE — Telephone Encounter (Signed)
Sched appt 8/7 at 12:15. Lm on hm# to inform pt.

## 2015-12-16 ENCOUNTER — Encounter: Payer: Self-pay | Admitting: Surgery

## 2015-12-20 ENCOUNTER — Encounter: Payer: Self-pay | Admitting: Surgery

## 2015-12-20 ENCOUNTER — Ambulatory Visit (INDEPENDENT_AMBULATORY_CARE_PROVIDER_SITE_OTHER): Payer: Self-pay | Admitting: Surgery

## 2015-12-20 VITALS — BP 138/94 | HR 83 | Temp 97.9°F | Resp 20 | Ht 64.0 in | Wt 205.4 lb

## 2015-12-20 DIAGNOSIS — N186 End stage renal disease: Secondary | ICD-10-CM

## 2015-12-20 DIAGNOSIS — Z992 Dependence on renal dialysis: Secondary | ICD-10-CM

## 2015-12-20 NOTE — Progress Notes (Signed)
   Patient name: Elizabeth Anthony MRN: [id number] DOB: 1970-09-19 Sex: female  REASON FOR VISIT: post-op  HPI: Elizabeth Anthony is a 45 y.o. female who is back for follow-up.  On 09/30/2015 she underwent a first stage left basilic vein transposition.  On 11/30/2015 she had her second stage procedure.  She is back today for follow-up.  She is currently dialyzing through a catheter.  She has occasional discomfort on the bottom side of her arm when it comes in to contact with something.  Otherwise she has no evidence of steal or any other complaints  Current Outpatient Prescriptions  Medication Sig Dispense Refill  . ALPRAZolam (XANAX) 0.5 MG tablet Take 1 tablet (0.5 mg total) by mouth 2 (two) times daily as needed for anxiety. 20 tablet 0  . calcium acetate (PHOSLO) 667 MG capsule Take 1,334 mg by mouth 3 (three) times daily with meals.     . metoprolol succinate (TOPROL-XL) 50 MG 24 hr tablet Take 25 mg by mouth daily. Take with or immediately following a meal.    . multivitamin (RENA-VIT) TABS tablet Take 1 tablet by mouth at bedtime.  11  . sevelamer (RENAGEL) 400 MG tablet Take 400 mg by mouth 3 (three) times daily with meals.    . temazepam (RESTORIL) 15 MG capsule Take 15 mg by mouth at bedtime as needed.  0  . cetirizine (ZYRTEC) 10 MG tablet Take 1 tablet (10 mg total) by mouth daily. (Patient not taking: Reported on 12/20/2015) 1 tablet 0  . fluticasone (FLONASE) 50 MCG/ACT nasal spray Reported on 10/22/2015    . oxyCODONE-acetaminophen (ROXICET) 5-325 MG tablet Take 1 tablet by mouth every 6 (six) hours as needed. (Patient not taking: Reported on 12/20/2015) 15 tablet 0   Current Facility-Administered Medications  Medication Dose Route Frequency Provider Last Rate Last Dose  . hepatitis B vaccine (ENGERIX-B) injection 20 mcg  1 mL Intramuscular Once Lorie PhenixNancy Maloney, MD        REVIEW OF SYSTEMS:  [X]  denotes positive finding, [ ]  denotes negative  finding Cardiac  Comments:  Chest pain or chest pressure:    Shortness of breath upon exertion:    Short of breath when lying flat:    Irregular heart rhythm:    Constitutional    Fever or chills:      PHYSICAL EXAM: Vitals:   12/20/15 1214 12/20/15 1223  BP: (!) 157/103 (!) 138/94  Pulse: 83   Resp: 20   Temp: 97.9 F (36.6 C)   TempSrc: Oral   Weight: 205 lb 6.4 oz (93.2 kg)   Height: 5\' 4"  (1.626 m)     GENERAL: The patient is a well-nourished female, in no acute distress. The vital signs are documented above. CARDIOVASCULAR: There is a regular rate and rhythm. PULMONARY: There is good air exchange bilaterally without wheezing or rales. Excellent thrill within left basilic vein transposition.  Incisions are healing nicely.  MEDICAL ISSUES: Fistula will be ready for use on August 18  Elizabeth CalWells Darlette Dubow, MD [facility] Tel [phone] Pager [phone]

## 2015-12-20 NOTE — Progress Notes (Signed)
Vitals:   12/20/15 1214 12/20/15 1223  BP: (!) 157/103 (!) 138/94  Pulse: 83   Resp: 20   Temp: 97.9 F (36.6 C)   TempSrc: Oral   Weight: 205 lb 6.4 oz (93.2 kg)   Height: 5\' 4"  (1.626 m)

## 2015-12-22 NOTE — Telephone Encounter (Signed)
error 

## 2016-01-02 ENCOUNTER — Emergency Department (HOSPITAL_COMMUNITY): Payer: BC Managed Care – PPO

## 2016-01-02 ENCOUNTER — Encounter (HOSPITAL_COMMUNITY): Payer: Self-pay

## 2016-01-02 ENCOUNTER — Emergency Department (HOSPITAL_COMMUNITY)
Admission: EM | Admit: 2016-01-02 | Discharge: 2016-01-02 | Disposition: A | Discharge status: Home / Self Care | Payer: BC Managed Care – PPO | Attending: Emergency Medicine | Admitting: Emergency Medicine

## 2016-01-02 DIAGNOSIS — Z79899 Other long term (current) drug therapy: Secondary | ICD-10-CM | POA: Diagnosis not present

## 2016-01-02 DIAGNOSIS — N186 End stage renal disease: Secondary | ICD-10-CM | POA: Diagnosis not present

## 2016-01-02 DIAGNOSIS — Z992 Dependence on renal dialysis: Secondary | ICD-10-CM | POA: Diagnosis not present

## 2016-01-02 DIAGNOSIS — M79672 Pain in left foot: Secondary | ICD-10-CM

## 2016-01-02 DIAGNOSIS — I120 Hypertensive chronic kidney disease with stage 5 chronic kidney disease or end stage renal disease: Secondary | ICD-10-CM | POA: Insufficient documentation

## 2016-01-02 MED ORDER — PREDNISONE 20 MG PO TABS: 40.0000 mg | ORAL_TABLET | Freq: Every day | ORAL | 0 refills | Status: DC

## 2016-01-02 MED ORDER — TRAMADOL HCL 50 MG PO TABS: 50.0000 mg | ORAL_TABLET | Freq: Four times a day (QID) | ORAL | 0 refills | Status: DC | PRN

## 2016-01-02 MED ORDER — MELOXICAM 7.5 MG PO TABS: 15.0000 mg | ORAL_TABLET | Freq: Every day | ORAL | 0 refills | Status: DC

## 2016-01-02 NOTE — Discharge Instructions (Signed)
Your heel pain may likely be retrocalcaneal bursitis. Treat with RICE tx (see attached. You can use Mobic as a non-steroidal antiinflammatory (NSAIDs).  If you feel you are completely unable to take NSAIDs, prednisone is another antiinflammatory, but has its own side effects.    Some info about Heel Bursitis:  Many cases of retrocalcaneal and retroachilles bursitis can be treated effectively at home. If home treatments fail to work, a doctor can prescribe one or more treatments (described below). Home Treatments People treating heel bursitis at home are advised to: Wear comfortable, supportive shoes. People can minimize friction at the heel by avoiding shoes that press against the back of the heel and wearing comfortable, supportive footwear. Shoes with an "Achilles notch," a groove in the collar at the back of the shoe to protect the Achilles tendon, can be particularly helpful. (Almost all running shoes are designed with an Achilles notch.) Rest. Frequent resting of the foot is essential for inflammation reduction. Medical professionals typically recommend the R.I.C.E. formula, which includes Rest, Ice, Compression, and Elevation. Over-the-counter NSAIDs, such as ibuprofen and naproxen, can also help relieve heel pain and inflammation associated with retrocalcaneal bursitis.

## 2016-01-02 NOTE — ED Provider Notes (Signed)
[place] DEPT Provider Note   CSN: [id number] Arrival date & time: 01/02/16  1152   By signing my name below, I, Royce Macadamia, attest that this documentation has been prepared under the direction and in the presence of  Delsa Grana, PA-C. Electronically Signed: Royce Macadamia, ED Scribe. 01/02/16. 3:43 PM.   History   Chief Complaint Chief Complaint  Patient presents with  . Foot Pain   The history is provided by the patient. No language interpreter was used.    HPI Comments:  Elizabeth Anthony is a 45 y.o. female who presents to the Emergency Department complaining of Left heel pain with some radiation to lateral ankle with associated mild lateral posterior ankle swelling. Pain is described as shooting, rated 7 out of 10, onset this morning upon wakening without any inciting injury or strain. Several years ago she had been told she had injuries to her Achilles of both legs after running marathons, but she has not had any trouble with her foot ankle or heel for several years, and has not been able to exercise recently.  Her pain is exacerbated when dangling in a dependent causing throbbing, also exacerbated when attempting to bear weight or put any pressure on her toes. When elevated she has some alleviation of pain.  She denies any sensation of popping. She denies any deformity or redness. No other associated or acute complaints.  Past Medical History:  Diagnosis Date  . Allergy   . Anemia   . Anxiety   . Hypertension   . Migraine   . PONV (postoperative nausea and vomiting)   . Renal disorder    Dialysis M/W/F  . Sleep apnea    uses cpap    Patient Active Problem List   Diagnosis Date Noted  . Insomnia 09/17/2015  . Muscle spasm 07/07/2015  . Acute anxiety 07/07/2015  . Abnormal EKG 01/26/2015  . Sleep apnea 01/26/2015  . 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 Umbilical hernia without obstruction or gangrene 01/26/2015  . Cervicalgia 01/26/2015  . Pain in  joint, forearm 01/26/2015  . Papanicolaou smear of cervix with low grade squamous intraepithelial lesion (LGSIL) 01/26/2015  . Premenopausal menorrhagia 01/26/2015  . Irregular menses 01/26/2015  . Benign hypertension with CKD (chronic kidney disease) stage V ([place]) 01/26/2015  . Kidney disease, chronic, stage V (end stage, EGFR < 15 ml/min) ([place]) 01/26/2015  . Fatigue 01/26/2015  . Skin photosensitivity 01/26/2015  . Rhinitis, allergic 01/26/2015  . Calf pain 01/26/2015  . Piriformis muscle pain 01/26/2015  . Anemia in CKD (chronic kidney disease) 01/26/2015  . Knee pain 01/26/2015  . Vitamin D deficiency 01/26/2015    Past Surgical History:  Procedure Laterality Date  . BASCILIC VEIN TRANSPOSITION Left 09/30/2015   Procedure: LEFT ARM FIRST STAGE BASILIC VEIN TRANSPOSITION;  Surgeon: Serafina Mitchell, MD;  Location: [facility];  Service: Vascular;  Laterality: Left;  . BASCILIC VEIN TRANSPOSITION Left 11/30/2015   Procedure: SECOND STAGE BASILIC VEIN TRANSPOSITION, left arm;  Surgeon: Serafina Mitchell, MD;  Location: [facility];  Service: Vascular;  Laterality: Left;  . BREAST REDUCTION SURGERY Bilateral   . insertion hemodialysis catheter    . REDUCTION MAMMAPLASTY  1998    OB History    No data available       Home Medications    Prior to Admission medications   Medication Sig Start Date End Date Taking? Authorizing Provider  ALPRAZolam Duanne Moron) 0.5 MG tablet Take 1 tablet (0.5 mg total) by mouth 2 (two) times  daily as needed for anxiety. 07/07/15   Margarita Rana, MD  calcium acetate (PHOSLO) 667 MG capsule Take 1,334 mg by mouth 3 (three) times daily with meals.     Historical Provider, MD  cetirizine (ZYRTEC) 10 MG tablet Take 1 tablet (10 mg total) by mouth daily. Patient not taking: Reported on 12/20/2015 10/22/15   Margarita Rana, MD  fluticasone [facility]) 50 MCG/ACT nasal spray Reported on 10/22/2015    Historical Provider, MD  meloxicam (MOBIC) 7.5 MG tablet Take 2 tablets (15 mg total)  by mouth daily. 01/02/16   Delsa Grana, PA-C  metoprolol succinate (TOPROL-XL) 50 MG 24 hr tablet Take 25 mg by mouth daily. Take with or immediately following a meal.    Historical Provider, MD  multivitamin (RENA-VIT) TABS tablet Take 1 tablet by mouth at bedtime. 08/31/15   Historical Provider, MD  oxyCODONE-acetaminophen (ROXICET) 5-325 MG tablet Take 1 tablet by mouth every 6 (six) hours as needed. Patient not taking: Reported on 12/20/2015 11/30/15   Alvia Grove, PA-C  predniSONE (DELTASONE) 20 MG tablet Take 2 tablets (40 mg total) by mouth daily. 01/02/16   Delsa Grana, PA-C  sevelamer (RENAGEL) 400 MG tablet Take 400 mg by mouth 3 (three) times daily with meals.    Historical Provider, MD  temazepam (RESTORIL) 15 MG capsule Take 15 mg by mouth at bedtime as needed. 11/26/15   Historical Provider, MD  traMADol (ULTRAM) 50 MG tablet Take 1 tablet (50 mg total) by mouth every 6 (six) hours as needed. 01/02/16   Delsa Grana, PA-C    Family History Family History  Problem Relation Age of Onset  . Cancer Mother     breast   . Kidney disease Mother   . Breast cancer Mother 67    Social History Social History  Substance Use Topics  . Smoking status: Never Smoker  . Smokeless tobacco: Never Used  . Alcohol use No     Allergies   Ace inhibitors; Dye fdc red [red dye]; Nsaids; Vancomycin; and Alcohol   Review of Systems Review of Systems  All other systems reviewed and are negative.    Physical Exam Updated Vital Signs BP 128/91 (BP Location: Right Arm)   Pulse 105   Temp 98 F (36.7 C) (Oral)   Resp 18   Ht _0  (1.626 m)   Wt 93 kg   SpO2 100%   BMI 35.19 kg/m   Physical Exam  Constitutional: She appears well-developed and well-nourished.  HENT:  Head: Normocephalic.  Eyes: Conjunctivae are normal.  Cardiovascular: Normal rate.   Pulmonary/Chest: Effort normal. No respiratory distress.  Abdominal: She exhibits no distension.  Musculoskeletal: Normal range of  motion.  Left lateral ankle tenderness posterior to and inferior to lateral malleolus with mild edema.  Achelles tendon without defect.  No tenderness to palpation.  Negative thomson test.  Tenderness to palpation over posterior calcaneus at insertion of achelles stendon.  ROM normal. Strength of plantar flexion decreased.  Sensation to pain normal.  Sensation to light tough normal.  Capillary refill normal.    Neurological: She is alert.  Skin: Skin is warm and dry.  Psychiatric: She has a normal mood and affect. Her behavior is normal.  Nursing note and vitals reviewed.    ED Treatments / Results   DIAGNOSTIC STUDIES:  Oxygen Saturation is 98% on RA, nml by my interpretation.    COORDINATION OF CARE:  3:43 PM Discussed treatment plan with pt at bedside and pt agreed  to plan.   Labs (all labs ordered are listed, but only abnormal results are displayed) Labs Reviewed - No data to display  EKG  EKG Interpretation None       Radiology Dg Foot Complete Left  Result Date: 01/02/2016 CLINICAL DATA:  Lateral heel pain for 1 day.  No injury. EXAM: LEFT FOOT - COMPLETE 3+ VIEW COMPARISON:  None. FINDINGS: There is no evidence of fracture or dislocation. There is no evidence of arthropathy or other focal bone abnormality. Soft tissues are remarkable for vascular calcification. IMPRESSION: Negative. Electronically Signed   By: Staci Righter M.D.   On: 01/02/2016 16:04    Procedures Procedures (including critical care time)  Medications Ordered in ED Medications - No data to display   Initial Impression / Assessment and Plan / ED Course  I have reviewed the triage vital signs and the nursing notes.  Pertinent labs & imaging results that were available during my care of the patient were reviewed by me and considered in my medical decision making (see chart for details).  Clinical Course   Pt with severe posterior left heel pain, present upon waking this am, no known injury.    Point tender at distal Achilles tendon at insertion of calcaneus, mild surrounding edema without erythema, warmth.  No palpable Achilles tendon defect. Negative Thompson's test, no concern for complete rupture.  She is unable to plantar flex against resistance but has active and passive normal range of motion of ankle.   Xray negative.  Pt may have heel tendonitis or bursitis (retrocalcaneal bursitis?).  Does not appear concerning for septic bursitis.  Pt given pain meds in ED, ice applied.  Placed in cam walker with heel insert and given crutches.  Patient is at end-stage renal patient on hemodialysis, she will call her PCP and inquire about Mobic use and/or steroid use for treatment of inflammation. Pt advised to follow up with orthopedics. conservative therapy (RICE) recommended and discussed. Patient will be discharged home & is agreeable with above plan. Returns precautions discussed. Pt appears safe for discharge.   Final Clinical Impressions(s) / ED Diagnoses   Final diagnoses:  Heel pain, left    New Prescriptions Discharge Medication List as of 01/02/2016  4:47 PM    START taking these medications   Details  meloxicam (MOBIC) 7.5 MG tablet Take 2 tablets (15 mg total) by mouth daily., Starting Sun 01/02/2016, Print    predniSONE (DELTASONE) 20 MG tablet Take 2 tablets (40 mg total) by mouth daily., Starting Sun 01/02/2016, Print    traMADol (ULTRAM) 50 MG tablet Take 1 tablet (50 mg total) by mouth every 6 (six) hours as needed., Starting Sun 01/02/2016, Print       I personally performed the services described in this documentation, which was scribed in my presence. The recorded information has been reviewed and is accurate.        Delsa Grana, PA-C 01/02/16 1721    Gareth Morgan, MD 01/06/16 1334

## 2016-01-02 NOTE — ED Triage Notes (Signed)
Patient here with left heel pain radiating to back of ankle since am, denies trauma. Pain only with ambulation

## 2016-01-05 ENCOUNTER — Encounter: Payer: Self-pay | Admitting: Family Medicine

## 2016-01-05 ENCOUNTER — Ambulatory Visit (INDEPENDENT_AMBULATORY_CARE_PROVIDER_SITE_OTHER): Payer: BC Managed Care – PPO | Admitting: Family Medicine

## 2016-01-05 VITALS — BP 126/80 | HR 110 | Temp 99.0°F | Resp 16 | Wt 205.0 lb

## 2016-01-05 DIAGNOSIS — M719 Bursopathy, unspecified: Secondary | ICD-10-CM

## 2016-01-05 NOTE — Progress Notes (Signed)
Patient: Elizabeth Anthony Female    DOB: Feb 03, 1971   45 y.o.   MRN: [id number] Visit Date: 01/05/2016  Today's Provider: Megan Mansichard Jairen Goldfarb Jr, MD   Chief Complaint  Patient presents with  . Hospitalization Follow-up  . Foot Pain   Subjective:    HPI Patient comes in today to follow up after being seen in the ER due to left heel pain. Patient reports that she woke up 3 days ago not being able to walk on her left foot. Patient denies any injury. She reports that she was prescribed prednisone, tramadol, and oxycodone and has not started any of her medications. Patient reports that she was instructed to follow up with her PCP, and she was referred to Ortho in the ER and has not heard from their office yet.     Allergies  Allergen Reactions  . Ace Inhibitors Other (See Comments)    Uncontrollable Coughing & Choking  . Dye Fdc Red [Red Dye] Other (See Comments)    Kidney failure  . Nsaids Other (See Comments)    Shoots creatine up  . Vancomycin Swelling, Rash and Other (See Comments)    Entire skin peeled  . Alcohol Itching    Hard liquor (Vodka)   Current Meds  Medication Sig  . calcium acetate (PHOSLO) 667 MG capsule Take 1,334 mg by mouth 3 (three) times daily with meals.   . fluticasone (FLONASE) 50 MCG/ACT nasal spray Reported on 10/22/2015  . metoprolol succinate (TOPROL-XL) 50 MG 24 hr tablet Take 25 mg by mouth daily. Take with or immediately following a meal.  . multivitamin (RENA-VIT) TABS tablet Take 1 tablet by mouth at bedtime.  . sevelamer (RENAGEL) 400 MG tablet Take 400 mg by mouth 3 (three) times daily with meals.  . temazepam (RESTORIL) 15 MG capsule Take 15 mg by mouth at bedtime as needed.   Current Facility-Administered Medications for the 01/05/16 encounter (Office Visit) with Maple Hudsonichard L Francina Beery Jr., MD  Medication  . hepatitis B vaccine (ENGERIX-B) injection 20 mcg    Review of Systems  Constitutional: Negative.   Eyes: Negative.   Respiratory:  Negative.   Cardiovascular: Negative.   Endocrine: Negative.   Musculoskeletal: Positive for arthralgias, gait problem and joint swelling.  Skin: Negative.   Neurological: Negative for dizziness, tremors, seizures, syncope, facial asymmetry, speech difficulty, weakness, light-headedness, numbness and headaches.  Psychiatric/Behavioral: Negative.     Social History  Substance Use Topics  . Smoking status: Never Smoker  . Smokeless tobacco: Never Used  . Alcohol use No   Objective:   BP 126/80 (BP Location: Right Arm, Patient Position: Sitting, Cuff Size: Normal)   Pulse (!) 110   Temp 99 F (37.2 C)   Resp 16   Wt 205 lb (93 kg)   BMI 35.19 kg/m   Physical Exam  Constitutional: She is oriented to person, place, and time. She appears well-developed and well-nourished.  Musculoskeletal:  Patient in boot for her left foot. She is comfortable in the boot. She is tender over the posterior calcaneus just below the insertion of the Achilles  Neurological: She is alert and oriented to person, place, and time.  Skin: Skin is warm and dry.        Assessment & Plan:     1. Bursitis/Achilles Continue to wear Boot which is helping a lot with the pain. - Ambulatory referral to Podiatry 2.Stage V CKD    I have done the exam and reviewed the  above chart and it is accurate to the best of my knowledge.   Mathilda Maguire Wendelyn BreslowGilbert Jr, MD  [facility]

## 2016-02-14 ENCOUNTER — Ambulatory Visit: Payer: BC Managed Care – PPO | Admitting: Surgery

## 2016-02-25 ENCOUNTER — Encounter: Payer: Self-pay | Admitting: Surgery

## 2016-02-28 ENCOUNTER — Ambulatory Visit (INDEPENDENT_AMBULATORY_CARE_PROVIDER_SITE_OTHER): Payer: Self-pay | Admitting: Surgery

## 2016-02-28 ENCOUNTER — Encounter: Payer: Self-pay | Admitting: Surgery

## 2016-02-28 VITALS — BP 168/109 | HR 87 | Temp 98.5°F | Resp 16 | Ht 64.0 in | Wt 205.9 lb

## 2016-02-28 DIAGNOSIS — Z992 Dependence on renal dialysis: Secondary | ICD-10-CM

## 2016-02-28 DIAGNOSIS — N186 End stage renal disease: Secondary | ICD-10-CM

## 2016-02-28 NOTE — Progress Notes (Signed)
Patient name: Elizabeth Anthony MRN: [id number] DOB: [birth year] Sex: female  REASON FOR VISIT: post op  HPI: Elizabeth Anthony is a 45 y.o. female returns today for follow-up.  She is status post second stage basilic vein procedure on 11/30/2015.  She started using her fistula around August 25.  Shortly thereafter she had an infiltration and her fistula occluded.  It was not opened with from a lysis but a catheter was placed.  She is here today to discuss new access.  She is currently being evaluated for transplant.  She is upset about some of her care regarding the dialysis center.  Specifically she feels she had a reaction to vancomycin which was given to her because she had a fever.  She had a reaction with skin breakdown.  In addition, when she was told that she needed a new access, she did not feel that she had an and empathetic response from her providers.  Current Outpatient Prescriptions  Medication Sig Dispense Refill  . calcium acetate (PHOSLO) 667 MG capsule Take 1,334 mg by mouth 3 (three) times daily with meals.     . fluticasone (FLONASE) 50 MCG/ACT nasal spray Reported on 10/22/2015    . metoprolol succinate (TOPROL-XL) 50 MG 24 hr tablet Take 25 mg by mouth daily. Take with or immediately following a meal.    . multivitamin (RENA-VIT) TABS tablet Take 1 tablet by mouth at bedtime.  11  . sevelamer (RENAGEL) 400 MG tablet Take 400 mg by mouth 3 (three) times daily with meals.    . temazepam (RESTORIL) 15 MG capsule Take 15 mg by mouth at bedtime as needed.  0  . ALPRAZolam (XANAX) 0.5 MG tablet Take 1 tablet (0.5 mg total) by mouth 2 (two) times daily as needed for anxiety. (Patient not taking: Reported on 02/28/2016) 20 tablet 0  . cetirizine (ZYRTEC) 10 MG tablet Take 1 tablet (10 mg total) by mouth daily. (Patient not taking: Reported on 02/28/2016) 1 tablet 0  . meloxicam (MOBIC) 7.5 MG tablet Take 2 tablets (15 mg total) by mouth daily. (Patient  not taking: Reported on 02/28/2016) 30 tablet 0  . oxyCODONE-acetaminophen (ROXICET) 5-325 MG tablet Take 1 tablet by mouth every 6 (six) hours as needed. (Patient not taking: Reported on 02/28/2016) 15 tablet 0  . predniSONE (DELTASONE) 20 MG tablet Take 2 tablets (40 mg total) by mouth daily. (Patient not taking: Reported on 02/28/2016) 10 tablet 0  . traMADol (ULTRAM) 50 MG tablet Take 1 tablet (50 mg total) by mouth every 6 (six) hours as needed. (Patient not taking: Reported on 02/28/2016) 15 tablet 0   Current Facility-Administered Medications  Medication Dose Route Frequency Provider Last Rate Last Dose  . hepatitis B vaccine (ENGERIX-B) injection 20 mcg  1 mL Intramuscular Once Lorie PhenixNancy Maloney, MD        REVIEW OF SYSTEMS:  [X]  denotes positive finding, [ ]  denotes negative finding Cardiac  Comments:  Chest pain or chest pressure:    Shortness of breath upon exertion:    Short of breath when lying flat:    Irregular heart rhythm:    Constitutional    Fever or chills:      PHYSICAL EXAM: Vitals:   02/28/16 0833 02/28/16 0835  BP: (!) 163/112 (!) 168/109  Pulse: 87   Resp: 16   Temp: 98.5 F (36.9 C)   TempSrc: Oral   SpO2: 99%   Weight: 205 lb 14.4 oz (93.4 kg)   Height: 5\' 4"  (1.626 m)  GENERAL: The patient is a well-nourished female, in no acute distress. The vital signs are documented above. CARDIOVASCULAR: There is a regular rate and rhythm. PULMONARY: There is good air exchange bilaterally without wheezing or rales. Pulsatility within the proximal fistula.  No thrill or pulse after the proximal third of the fistula.  All incisions are intact.  MEDICAL ISSUES: I spoke with Dr. Juel Burrow.  The fistula did not get from a lysis because it was a difficult cannulation and because of the medial aspect of the fistula, the patient did not tolerate dialysis well having to externally rotate her shoulder.  I spoke at length with the patient about our options.  In order to keep her  access limited to her left arm, the next step would be a left upper arm graft.  Alternatively, if she would prefer a fistula, we can attempt a right brachiocephalic versus a right basilic vein transposition.  The patient is going to contemplate which she prefers and contact me when she has made a decision.  Durene Cal, MD [facility] [phone] Pager [phone]

## 2016-07-27 ENCOUNTER — Emergency Department (HOSPITAL_COMMUNITY): Payer: BC Managed Care – PPO

## 2016-07-27 ENCOUNTER — Encounter (HOSPITAL_COMMUNITY): Payer: Self-pay

## 2016-07-27 ENCOUNTER — Inpatient Hospital Stay (HOSPITAL_COMMUNITY)
Admission: EM | Admit: 2016-07-27 | Discharge: 2016-07-29 | DRG: 040 | Disposition: A | Discharge status: Home / Self Care | Payer: BC Managed Care – PPO | Attending: Internal Medicine | Admitting: Internal Medicine

## 2016-07-27 ENCOUNTER — Observation Stay (HOSPITAL_COMMUNITY): Payer: BC Managed Care – PPO

## 2016-07-27 DIAGNOSIS — E669 Obesity, unspecified: Secondary | ICD-10-CM | POA: Diagnosis present

## 2016-07-27 DIAGNOSIS — D696 Thrombocytopenia, unspecified: Secondary | ICD-10-CM

## 2016-07-27 DIAGNOSIS — Z886 Allergy status to analgesic agent status: Secondary | ICD-10-CM

## 2016-07-27 DIAGNOSIS — Z888 Allergy status to other drugs, medicaments and biological substances status: Secondary | ICD-10-CM

## 2016-07-27 DIAGNOSIS — Z79899 Other long term (current) drug therapy: Secondary | ICD-10-CM | POA: Diagnosis not present

## 2016-07-27 DIAGNOSIS — R4701 Aphasia: Secondary | ICD-10-CM | POA: Diagnosis present

## 2016-07-27 DIAGNOSIS — Z881 Allergy status to other antibiotic agents status: Secondary | ICD-10-CM | POA: Diagnosis not present

## 2016-07-27 DIAGNOSIS — R778 Other specified abnormalities of plasma proteins: Secondary | ICD-10-CM | POA: Diagnosis not present

## 2016-07-27 DIAGNOSIS — Z862 Personal history of diseases of the blood and blood-forming organs and certain disorders involving the immune mechanism: Secondary | ICD-10-CM | POA: Diagnosis not present

## 2016-07-27 DIAGNOSIS — Q211 Atrial septal defect: Secondary | ICD-10-CM | POA: Diagnosis not present

## 2016-07-27 DIAGNOSIS — N185 Chronic kidney disease, stage 5: Secondary | ICD-10-CM

## 2016-07-27 DIAGNOSIS — Q2112 Patent foramen ovale: Secondary | ICD-10-CM

## 2016-07-27 DIAGNOSIS — I120 Hypertensive chronic kidney disease with stage 5 chronic kidney disease or end stage renal disease: Secondary | ICD-10-CM | POA: Diagnosis present

## 2016-07-27 DIAGNOSIS — G458 Other transient cerebral ischemic attacks and related syndromes: Secondary | ICD-10-CM | POA: Diagnosis not present

## 2016-07-27 DIAGNOSIS — Z6836 Body mass index (BMI) 36.0-36.9, adult: Secondary | ICD-10-CM

## 2016-07-27 DIAGNOSIS — I6340 Cerebral infarction due to embolism of unspecified cerebral artery: Principal | ICD-10-CM | POA: Diagnosis present

## 2016-07-27 DIAGNOSIS — R4789 Other speech disturbances: Secondary | ICD-10-CM

## 2016-07-27 DIAGNOSIS — Z7982 Long term (current) use of aspirin: Secondary | ICD-10-CM

## 2016-07-27 DIAGNOSIS — T85611A Breakdown (mechanical) of intraperitoneal dialysis catheter, initial encounter: Secondary | ICD-10-CM

## 2016-07-27 DIAGNOSIS — D631 Anemia in chronic kidney disease: Secondary | ICD-10-CM | POA: Diagnosis present

## 2016-07-27 DIAGNOSIS — K5900 Constipation, unspecified: Secondary | ICD-10-CM | POA: Diagnosis present

## 2016-07-27 DIAGNOSIS — G459 Transient cerebral ischemic attack, unspecified: Secondary | ICD-10-CM | POA: Diagnosis not present

## 2016-07-27 DIAGNOSIS — I639 Cerebral infarction, unspecified: Secondary | ICD-10-CM | POA: Diagnosis not present

## 2016-07-27 DIAGNOSIS — Z7682 Awaiting organ transplant status: Secondary | ICD-10-CM

## 2016-07-27 DIAGNOSIS — Z992 Dependence on renal dialysis: Secondary | ICD-10-CM | POA: Diagnosis not present

## 2016-07-27 DIAGNOSIS — F419 Anxiety disorder, unspecified: Secondary | ICD-10-CM | POA: Diagnosis present

## 2016-07-27 DIAGNOSIS — N189 Chronic kidney disease, unspecified: Secondary | ICD-10-CM

## 2016-07-27 DIAGNOSIS — N186 End stage renal disease: Secondary | ICD-10-CM

## 2016-07-27 DIAGNOSIS — G4733 Obstructive sleep apnea (adult) (pediatric): Secondary | ICD-10-CM | POA: Diagnosis present

## 2016-07-27 DIAGNOSIS — Z91048 Other nonmedicinal substance allergy status: Secondary | ICD-10-CM | POA: Diagnosis not present

## 2016-07-27 DIAGNOSIS — Z91041 Radiographic dye allergy status: Secondary | ICD-10-CM

## 2016-07-27 DIAGNOSIS — R002 Palpitations: Secondary | ICD-10-CM | POA: Diagnosis present

## 2016-07-27 DIAGNOSIS — Z841 Family history of disorders of kidney and ureter: Secondary | ICD-10-CM

## 2016-07-27 DIAGNOSIS — G43909 Migraine, unspecified, not intractable, without status migrainosus: Secondary | ICD-10-CM | POA: Diagnosis present

## 2016-07-27 DIAGNOSIS — Z803 Family history of malignant neoplasm of breast: Secondary | ICD-10-CM

## 2016-07-27 LAB — COMPREHENSIVE METABOLIC PANEL
ALT: 17 U/L (ref 14–54)
AST: 20 U/L (ref 15–41)
Albumin: 3 g/dL — ABNORMAL LOW (ref 3.5–5.0)
Alkaline Phosphatase: 76 U/L (ref 38–126)
Anion gap: 14 (ref 5–15)
BUN: 53 mg/dL — ABNORMAL HIGH (ref 6–20)
CHLORIDE: 94 mmol/L — AB (ref 101–111)
CO2: 24 mmol/L (ref 22–32)
Calcium: 12.3 mg/dL — ABNORMAL HIGH (ref 8.9–10.3)
Creatinine, Ser: 16.75 mg/dL — ABNORMAL HIGH (ref 0.44–1.00)
GFR, EST AFRICAN AMERICAN: 3 mL/min — AB (ref >60)
GFR, EST NON AFRICAN AMERICAN: 2 mL/min — AB (ref >60)
Glucose, Bld: 106 mg/dL — ABNORMAL HIGH (ref 65–99)
POTASSIUM: 3.8 mmol/L (ref 3.5–5.1)
Sodium: 132 mmol/L — ABNORMAL LOW (ref 135–145)
TOTAL PROTEIN: 7.6 g/dL (ref 6.5–8.1)
Total Bilirubin: 0.5 mg/dL (ref 0.3–1.2)

## 2016-07-27 LAB — CBC
HEMATOCRIT: 32.9 % — AB (ref 36.0–46.0)
Hemoglobin: 11.1 g/dL — ABNORMAL LOW (ref 12.0–15.0)
MCH: 34.3 pg — ABNORMAL HIGH (ref 26.0–34.0)
MCHC: 33.7 g/dL (ref 30.0–36.0)
MCV: 101.5 fL — AB (ref 78.0–100.0)
Platelets: 63 10*3/uL — ABNORMAL LOW (ref 150–400)
RBC: 3.24 MIL/uL — ABNORMAL LOW (ref 3.87–5.11)
RDW: 17.3 % — AB (ref 11.5–15.5)
WBC: 8 10*3/uL (ref 4.0–10.5)

## 2016-07-27 LAB — I-STAT TROPONIN, ED: TROPONIN I, POC: 0.31 ng/mL — AB (ref 0.00–0.08)

## 2016-07-27 LAB — I-STAT CHEM 8, ED
BUN: 54 mg/dL — ABNORMAL HIGH (ref 6–20)
CALCIUM ION: 1.43 mmol/L — AB (ref 1.15–1.40)
Chloride: 97 mmol/L — ABNORMAL LOW (ref 101–111)
Creatinine, Ser: >18 mg/dL — ABNORMAL HIGH (ref 0.44–1.00)
GLUCOSE: 106 mg/dL — AB (ref 65–99)
HCT: 33 % — ABNORMAL LOW (ref 36.0–46.0)
HEMOGLOBIN: 11.2 g/dL — AB (ref 12.0–15.0)
POTASSIUM: 3.9 mmol/L (ref 3.5–5.1)
SODIUM: 131 mmol/L — AB (ref 135–145)
TCO2: 23 mmol/L (ref 0–100)

## 2016-07-27 LAB — DIFFERENTIAL
BASOS PCT: 0 %
Basophils Absolute: 0 10*3/uL (ref 0.0–0.1)
EOS ABS: 0.1 10*3/uL (ref 0.0–0.7)
EOS PCT: 2 %
Lymphocytes Relative: 33 %
Lymphs Abs: 2.7 10*3/uL (ref 0.7–4.0)
MONO ABS: 0.7 10*3/uL (ref 0.1–1.0)
MONOS PCT: 9 %
Neutro Abs: 4.5 10*3/uL (ref 1.7–7.7)
Neutrophils Relative %: 56 %

## 2016-07-27 LAB — PROTIME-INR
INR: 1.06
Prothrombin Time: 13.9 seconds (ref 11.4–15.2)

## 2016-07-27 LAB — TROPONIN I
TROPONIN I: 0.32 ng/mL — AB (ref <0.03)
Troponin I: 0.29 ng/mL (ref <0.03)

## 2016-07-27 LAB — CBG MONITORING, ED: GLUCOSE-CAPILLARY: 121 mg/dL — AB (ref 65–99)

## 2016-07-27 LAB — LIPID PANEL
CHOL/HDL RATIO: 3.6 ratio
CHOLESTEROL: 124 mg/dL (ref 0–200)
HDL: 34 mg/dL — ABNORMAL LOW (ref >40)
LDL Cholesterol: 40 mg/dL (ref 0–99)
Triglycerides: 249 mg/dL — ABNORMAL HIGH (ref <150)
VLDL: 50 mg/dL — ABNORMAL HIGH (ref 0–40)

## 2016-07-27 LAB — APTT: APTT: 28 s (ref 24–36)

## 2016-07-27 LAB — TSH: TSH: 2.534 u[IU]/mL (ref 0.350–4.500)

## 2016-07-27 MED ORDER — SEVELAMER CARBONATE 800 MG PO TABS
2400.0000 mg | ORAL_TABLET | Freq: Three times a day (TID) | ORAL | Status: DC
  Administered 2016-07-28 – 2016-07-29 (×4): 2400 mg via ORAL
  Filled 2016-07-27 (×4): qty 3

## 2016-07-27 MED ORDER — IOPAMIDOL (ISOVUE-370) INJECTION 76%: 50.0000 mL | Freq: Once | INTRAVENOUS | Status: DC | PRN

## 2016-07-27 MED ORDER — STROKE: EARLY STAGES OF RECOVERY BOOK
Freq: Once | Status: AC
  Administered 2016-07-27: 22:00:00
  Filled 2016-07-27: qty 1

## 2016-07-27 MED ORDER — RENA-VITE PO TABS
1.0000 | ORAL_TABLET | Freq: Every day | ORAL | Status: DC
  Administered 2016-07-27 – 2016-07-28 (×2): 1 via ORAL
  Filled 2016-07-27 (×2): qty 1

## 2016-07-27 MED ORDER — HEPARIN 1000 UNIT/ML FOR PERITONEAL DIALYSIS
2500.0000 [IU] | INTRAMUSCULAR | Status: DC | PRN
  Filled 2016-07-27: qty 2.5

## 2016-07-27 MED ORDER — GENTAMICIN SULFATE 0.1 % EX CREA
1.0000 "application " | TOPICAL_CREAM | Freq: Every day | CUTANEOUS | Status: DC
  Administered 2016-07-27: 1 via TOPICAL
  Filled 2016-07-27: qty 15

## 2016-07-27 MED ORDER — ASPIRIN 325 MG PO TABS
325.0000 mg | ORAL_TABLET | Freq: Every day | ORAL | Status: DC
  Administered 2016-07-28: 325 mg via ORAL
  Filled 2016-07-27: qty 1

## 2016-07-27 MED ORDER — ASPIRIN 325 MG PO TABS
325.0000 mg | ORAL_TABLET | Freq: Once | ORAL | Status: AC
Start: 2016-07-27 — End: 2016-07-27
  Administered 2016-07-27: 325 mg via ORAL
  Filled 2016-07-27: qty 1

## 2016-07-27 MED ORDER — PANTOPRAZOLE SODIUM 20 MG PO TBEC
20.0000 mg | DELAYED_RELEASE_TABLET | Freq: Every day | ORAL | Status: DC
  Administered 2016-07-27 – 2016-07-29 (×3): 20 mg via ORAL
  Filled 2016-07-27 (×3): qty 1

## 2016-07-27 NOTE — Consult Note (Signed)
Neurology Consultation Reason for Consult: code stroke Referring Physician: ED  CC: speech difficulty and arrest  History is obtained from:patient  HPI: Elizabeth Anthony is a 46 y.o. female with multiple stroke risk factors despite her relatively young age.  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  Then at some point later she simply could no longer say what she wanted to say because the words could not come out.  At that point she decided to come to the hospital.  She denied having any motor or sensory symptoms on the right associated with the difficulty of speech.  She feels that her symptoms are coming and going and last for about 10 minutes.   LKW: 12:30 tpa given?: no -- NIHSS = 0 on exam  ROS: A 14 point ROS was performed and is negative except as noted in the HPI.   Past Medical History:  Diagnosis Date  . Allergy   . Anemia   . Anxiety   . Hypertension   . Migraine   . PONV (postoperative nausea and vomiting)   . Renal disorder    Dialysis M/W/F  . Sleep apnea    uses cpap     Family History  Problem Relation Age of Onset  . Cancer Mother     breast   . Kidney disease Mother   . Breast cancer Mother 24    Social History:  reports that she has never smoked. She has never used smokeless tobacco. She reports that she does not drink alcohol or use drugs.  Exam: Current vital signs: BP 111/90 (BP Location: Right Arm)   Pulse 100   Temp 98.3 F (36.8 C)   Resp 19   SpO2 99%  Vital signs in last 24 hours: Temp:  [98.3 F (36.8 C)] 98.3 F (36.8 C) (03/15 1456) Pulse Rate:  [100-105] 100 (03/15 1444) Resp:  [18-19] 19 (03/15 1444) BP: (108-111)/(80-90) 111/90 (03/15 1444) SpO2:  [98 %-99 %] 99 % (03/15 1444)   Physical Exam  Constitutional: Appears well-developed and well-nourished.  Psych: Affect appropriate to situation Eyes: No scleral injection HENT: No OP  obstrucion Head: Normocephalic.  Cardiovascular: Normal rate and regular rhythm.  Respiratory: Effort normal and breath sounds normal to anterior ascultation GI: Soft.  No distension. There is no tenderness.  Skin: WDI  Neuro: Mental Status: Patient is awake, alert, oriented to person, place, month, year, and situation. Patient is able to give a clear and coherent history No signs of aphasia or negle Cranial Nerves: II: Visual Fields are full. Pupils are equal, round, and reactive to light. III,IV, VI: EOMI without ptosis or diploplia.  V: Facial sensation is symmetric to temperature VII: Facial movement is symmetric.  VIII: hearing is intact to voice X: Uvula elevates symmetrically XI: Shoulder shrug is symmetric. XII: tongue is midline without atrophy or fasciculations.  Motor: Tone is normal. Bulk is normal. 5/5 strength was present in all four extremities. Sensory: Sensation is symmetric to light touch and temperature in the arms and legs Deep Tendon Reflexes: 2+ and symmetric in the biceps and patellae. Plantars: Toes are downgoing bilaterally. Cerebellar: FNF and HKS are intact bilaterally    Impression: TIA vs seizure.  Given her multiple stroke risk factors I am most concerned about her having a TIA.  I am going to order a brain MRI with and without contrast because she has a small hyperdensity in the left  frontal lobe.  She is to be admitted by the medicine service.  PT currently not on asa. Will start her on 325.  First dose in ED.  Please do not treat hypertension until we have MRI/MRA and carotid testing.  Will follow

## 2016-07-27 NOTE — ED Notes (Signed)
Pt ambulated to restroom, pt had no complaints and ambulated with a steady gait.  

## 2016-07-27 NOTE — ED Provider Notes (Signed)
[facility] DEPT Provider Note   CSN: [id number] Arrival date & time: 07/27/16  1407   An emergency department physician performed an initial assessment on this suspected stroke patient at 1403.  History   Chief Complaint Chief Complaint  Patient presents with  . Code Stroke  . Transient Ischemic Attack    HPI Elizabeth Anthony is a 46 y.o. female.  HPI  46 year old female history of end-stage renal disease on peritoneal dialysis, hypertension, anxiety, anemia, migraines presents to the emergency department with a neurologic problem. Patient states that she was at work and she is having word finding difficulties. Difficulty counting. Also see measures slurred speech to her coworkers. So she came here for further evaluation. At this time she has no complaints. No chest pain, shortness of breath, neurologic complaints, vision changes. She didn't do anything to make it better. She didn't do anything that makes it worse. No history of the same. Has been stressed recently. Is also been more tired than normal.  Past Medical History:  Diagnosis Date  . Allergy   . Anemia   . Anxiety   . Hypertension   . Migraine   . PONV (postoperative nausea and vomiting)   . Renal disorder    Dialysis M/W/F  . Sleep apnea    uses cpap    Patient Active Problem List   Diagnosis Date Noted  . Insomnia 09/17/2015  . Muscle spasm 07/07/2015  . Acute anxiety 07/07/2015  . Abnormal EKG 01/26/2015  . Sleep apnea 01/26/2015  . 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 Umbilical hernia without obstruction or gangrene 01/26/2015  . Cervicalgia 01/26/2015  . Pain in joint, forearm 01/26/2015  . Papanicolaou smear of cervix with low grade squamous intraepithelial lesion (LGSIL) 01/26/2015  . Premenopausal menorrhagia 01/26/2015  . Irregular menses 01/26/2015  . Benign hypertension with CKD (chronic kidney disease) stage V ([place]) 01/26/2015  . Kidney disease, chronic, stage V (end stage, EGFR < 15 ml/min)  ([place]) 01/26/2015  . Fatigue 01/26/2015  . Skin photosensitivity 01/26/2015  . Rhinitis, allergic 01/26/2015  . Calf pain 01/26/2015  . Piriformis muscle pain 01/26/2015  . Anemia in CKD (chronic kidney disease) 01/26/2015  . Knee pain 01/26/2015  . Vitamin D deficiency 01/26/2015    Past Surgical History:  Procedure Laterality Date  . BASCILIC VEIN TRANSPOSITION Left 09/30/2015   Procedure: LEFT ARM FIRST STAGE BASILIC VEIN TRANSPOSITION;  Surgeon: Serafina Mitchell, MD;  Location: [facility];  Service: Vascular;  Laterality: Left;  . BASCILIC VEIN TRANSPOSITION Left 11/30/2015   Procedure: SECOND STAGE BASILIC VEIN TRANSPOSITION, left arm;  Surgeon: Serafina Mitchell, MD;  Location: [facility];  Service: Vascular;  Laterality: Left;  . BREAST REDUCTION SURGERY Bilateral   . insertion hemodialysis catheter    . REDUCTION MAMMAPLASTY  1998    OB History    No data available       Home Medications    Prior to Admission medications   Medication Sig Start Date End Date Taking? Authorizing Provider  ALPRAZolam Duanne Moron) 0.5 MG tablet Take 1 tablet (0.5 mg total) by mouth 2 (two) times daily as needed for anxiety. Patient not taking: Reported on 02/28/2016 07/07/15   Margarita Rana, MD  calcium acetate (PHOSLO) 667 MG capsule Take 1,334 mg by mouth 3 (three) times daily with meals.     Historical Provider, MD  cetirizine (ZYRTEC) 10 MG tablet Take 1 tablet (10 mg total) by mouth daily. Patient not taking: Reported on 02/28/2016 10/22/15   Margarita Rana,  MD  fluticasone (FLONASE) 50 MCG/ACT nasal spray Reported on 10/22/2015    Historical Provider, MD  meloxicam (MOBIC) 7.5 MG tablet Take 2 tablets (15 mg total) by mouth daily. Patient not taking: Reported on 02/28/2016 01/02/16   Delsa Grana, PA-C  metoprolol succinate (TOPROL-XL) 50 MG 24 hr tablet Take 25 mg by mouth daily. Take with or immediately following a meal.    Historical Provider, MD  multivitamin (RENA-VIT) TABS tablet Take 1 tablet by mouth at  bedtime. 08/31/15   Historical Provider, MD  oxyCODONE-acetaminophen (ROXICET) 5-325 MG tablet Take 1 tablet by mouth every 6 (six) hours as needed. Patient not taking: Reported on 02/28/2016 11/30/15   Alvia Grove, PA-C  predniSONE (DELTASONE) 20 MG tablet Take 2 tablets (40 mg total) by mouth daily. Patient not taking: Reported on 02/28/2016 01/02/16   Delsa Grana, PA-C  sevelamer (RENAGEL) 400 MG tablet Take 400 mg by mouth 3 (three) times daily with meals.    Historical Provider, MD  temazepam (RESTORIL) 15 MG capsule Take 15 mg by mouth at bedtime as needed. 11/26/15   Historical Provider, MD  traMADol (ULTRAM) 50 MG tablet Take 1 tablet (50 mg total) by mouth every 6 (six) hours as needed. Patient not taking: Reported on 02/28/2016 01/02/16   Delsa Grana, PA-C    Family History Family History  Problem Relation Age of Onset  . Cancer Mother     breast   . Kidney disease Mother   . Breast cancer Mother 33    Social History Social History  Substance Use Topics  . Smoking status: Never Smoker  . Smokeless tobacco: Never Used  . Alcohol use No     Allergies   Ace inhibitors; Dye fdc red [red dye]; Nsaids; Vancomycin; Alcohol-sulfur [sulfur]; and Alcohol   Review of Systems Review of Systems  All other systems reviewed and are negative.    Physical Exam Updated Vital Signs BP 111/90 (BP Location: Right Arm)   Pulse 100   Temp 98.3 F (36.8 C)   Resp 19   SpO2 99%   Physical Exam  Constitutional: She is oriented to person, place, and time. She appears well-developed and well-nourished.  HENT:  Head: Normocephalic and atraumatic.  Eyes: Conjunctivae and EOM are normal.  Neck: Normal range of motion.  Cardiovascular: Normal rate and regular rhythm.   Pulmonary/Chest: No stridor. No respiratory distress.  Abdominal: She exhibits no distension.  Neurological: She is alert and oriented to person, place, and time.  No altered mental status, able to give full  seemingly accurate history.  Face is symmetric, EOM's intact, pupils equal and reactive, vision intact, tongue and uvula midline without deviation Upper and Lower extremity motor 5/5, intact pain perception in distal extremities, 2+ reflexes in biceps, patella and achilles tendons. Finger to nose normal, heel to shin normal.   Nursing note and vitals reviewed.    ED Treatments / Results  Labs (all labs ordered are listed, but only abnormal results are displayed) Labs Reviewed  I-STAT TROPOININ, ED - Abnormal; Notable for the following:       Result Value   Troponin i, poc 0.31 (*)    All other components within normal limits  CBG MONITORING, ED - Abnormal; Notable for the following:    Glucose-Capillary 121 (*)    All other components within normal limits  I-STAT CHEM 8, ED - Abnormal; Notable for the following:    Sodium 131 (*)    Chloride 97 (*)  BUN 54 (*)    Creatinine, Ser >18.00 (*)    Glucose, Bld 106 (*)    Calcium, Ion 1.43 (*)    Hemoglobin 11.2 (*)    HCT 33.0 (*)    All other components within normal limits  PROTIME-INR  APTT  CBC  DIFFERENTIAL  COMPREHENSIVE METABOLIC PANEL  TROPONIN I    EKG  EKG Interpretation  Date/Time:  Thursday July 27 2016 14:29:57 EDT Ventricular Rate:  102 PR Interval:    QRS Duration: 79 QT Interval:  333 QTC Calculation: 434 R Axis:   24 Text Interpretation:  Sinus tachycardia Low voltage, precordial leads RSR' in V1 or V2, right VCD or RVH Confirmed by [facility] MD, Corene Cornea [phone]) on 07/27/2016 3:24:40 PM       Radiology Ct Head Code Stroke W/o Cm  Result Date: 07/27/2016 CLINICAL DATA:  Code stroke.  Intermittent slurred speech. EXAM: CT HEAD WITHOUT CONTRAST TECHNIQUE: Contiguous axial images were obtained from the base of the skull through the vertex without intravenous contrast. COMPARISON:  None. FINDINGS: Brain: No evidence of acute infarction, hemorrhage, hydrocephalus, extra-axial collection or mass lesion/mass  effect. Vascular: No hyperdense vessel. Mild calcific atherosclerosis of paraclinoid internal carotid arteries. Skull: Normal. Negative for fracture or focal lesion. Sinuses/Orbits: No acute finding. Other: None. ASPECTS [facility] Stroke Program Early CT Score) - Ganglionic level infarction (caudate, lentiform nuclei, internal capsule, insula, M1-M3 cortex): 7 - Supraganglionic infarction (M4-M6 cortex): 3 Total score (0-10 with 10 being normal): 10 IMPRESSION: 1. No acute intracranial abnormality identified. Unremarkable CT of the head. 2. ASPECTS is 10 These results were called by telephone at the time of interpretation on 07/27/2016 at 2:31 pm to Dr. Jacqulyn Bath, who verbally acknowledged these results. Electronically Signed   By: Kristine Garbe M.D.   On: 07/27/2016 14:32    Procedures Procedures (including critical care time)  Medications Ordered in ED Medications  aspirin tablet 325 mg (not administered)     Initial Impression / Assessment and Plan / ED Course  I have reviewed the triage vital signs and the nursing notes.  Pertinent labs & imaging results that were available during my care of the patient were reviewed by me and considered in my medical decision making (see chart for details).    TIA v seizure. No neuro problem now. Also elevated troponin, likely 2/2 ESRD but never had elevated before. No cp/sob/ecg changes to worry for ACS, will give ASA and trend in hospital. Elevated creatinine, states she decreased the volume of her dialysate recently, possibly related? Nephrology consult in hospital?    Final Clinical Impressions(s) / ED Diagnoses   Final diagnoses:  TIA (transient ischemic attack)  TIA (transient ischemic attack)  Episodes of speech arrest    New Prescriptions New Prescriptions   No medications on file     Merrily Pew, MD 07/28/16 1131

## 2016-07-27 NOTE — Progress Notes (Signed)
Patient arrived from ED. A&O X4, denies pain, reports that she had slurred speech but has since resolved. 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 At this time she denies any need-will continue to monitor.

## 2016-07-27 NOTE — ED Triage Notes (Signed)
Pt is a peritoneal dialysis patient; reports she was at work today and around 12:30 she started having slurred speech and was having trouble counting and the words were not coming out the way theyb were meant to. She states her speech is normal right now but she and her family member state that the symptoms are coming and going. Pt was ambulatory to triage with independent steady gait. Pt also reports feeling very tired, more than usual.

## 2016-07-27 NOTE — ED Notes (Signed)
Activated code stroke with [facility] @ 14:03

## 2016-07-27 NOTE — ED Notes (Signed)
Pt to MRI at this time.

## 2016-07-27 NOTE — H&P (Signed)
Date: 07/27/2016               Patient Name:  Elizabeth Anthony MRN: [id number]  DOB: 09-27-1970 Age / Sex: 46 y.o., female   PCP: Maple Hudson., MD         Medical Service: Internal Medicine Teaching Service         Attending Physician: Dr. Earl Lagos    First Contact: Dr. Althia Forts Pager: [phone]  Second Contact: Dr. John Giovanni Pager: [phone]       After Hours (After 5p/  First Contact Pager: [phone]  weekends / holidays): Second Contact Pager: [phone]   Chief Complaint: Speech difficulty  History of Present Illness: Shatori Tuohy is a 46 y.o. woman with PMH obesity, HTN, OSA on CPAP, ESRD on PD,  biopsy confirmed collapsing FSGS, migraines, and OSA on CPAP Came to the ED with complaint of transient word finding difficulties and slurred speech lasting for half an hour.    She is a Geophysicist/field seismologist principal at [employer], noticed around 12:30pm today that she was having some trouble speaking but still could communicate.  Then at some point later she simply could no longer say what she wanted to say because the words could not come out.  At that point she decided to come to the hospital.  She denied having any focal motor or sensory deficit  associated with the difficulty of speech.  She feels that her symptoms are coming and going and last for about 20-30 minutes. She do recall having feeling of heaviness of right arm and hand approximately 2 weeks ago which resolved within 5-10 minutes.  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 She was placed on peritoneal dialysis in February 2017. She also has right tunneled catheter, which was used initially for hemodialysis, later patient preferred peritoneal dialysis because of her busy schedule. According to patient she is compliant with her daily dialysis  routine. She is now on kidney transplant list.   Meds:  Current Facility-Administered Medications for the 07/27/16 encounter [facility] Encounter)  Medication  . hepatitis B vaccine (ENGERIX-B) injection 20 mcg   No outpatient prescriptions have been marked as taking for the 07/27/16 encounter [facility] Encounter).    Allergies: Allergies as of 07/27/2016 - Review Complete 07/27/2016  Allergen Reaction Noted  . Ace inhibitors Other (See Comments) 03/06/2014  . Dye fdc red [red dye] Other (See Comments) 03/06/2014  . Nsaids Other (See Comments) 03/06/2014  . Vancomycin Swelling, Rash, and Other (See Comments) 08/30/2015  . Alcohol-sulfur [sulfur]  02/28/2016  . Alcohol Itching 02/09/2015   Past Medical History:  Diagnosis Date  . Allergy   . Anemia   . Anxiety   . Hypertension   . Migraine   . PONV (postoperative nausea and vomiting)   . Renal disorder    Dialysis M/W/F  . Sleep apnea    uses cpap    Family History:  Family History  Problem Relation Age of Onset  . Cancer Mother     breast   . Kidney disease Mother   . Breast cancer Mother 55    Social History:  Social History   Social History  . Marital status: Single    Spouse name: N/A  . Number of children: N/A  . Years of education: N/A   Occupational History  . Not on file.  Social History Main Topics  . Smoking status: Never Smoker  . Smokeless tobacco: Never Used  . Alcohol use No  . Drug use: No  . Sexual activity: Not on file   Other Topics Concern  . Not on file   Social History Narrative  . No narrative on file    Review of Systems: A complete ROS was negative except as per HPI.  General: Denies fever, chills, fatigue, unexpected weight loss, change in appetite and diaphoresis.  Respiratory: Denies SOB, cough, DOE, chest tightness, and wheezing.   Cardiovascular: Denies chest pain and palpitations.  Gastrointestinal: Denies nausea, vomiting, abdominal pain, diarrhea, constipation,  blood in stool and abdominal distention.  Genitourinary: Denies dysuria, urgency, frequency, hematuria, suprapubic pain and flank pain. Endocrine: Denies hot or cold intolerance, polyuria, and polydipsia. Musculoskeletal: Denies myalgias, back pain, joint swelling, arthralgias and gait problem.  Skin: Denies pallor, rash and wounds.  Neurological: Denies dizziness, headaches, weakness, lightheadedness, numbness, seizures, and syncope. Psychiatric/Behavioral: Denies mood changes, confusion, nervousness, sleep disturbance and agitation.  Physical Exam: Blood pressure (!) 136/97, pulse 93, temperature 98.3 F (36.8 C), resp. rate 11, SpO2 100 %. Vitals:   07/27/16 1856 07/27/16 2000 07/27/16 2123 07/27/16 2200  BP: (!) 132/98 (!) 136/94 135/89 121/90  Pulse: 96 97 (!) 115   Resp: 17 18 18 18   Temp: 98.3 F (36.8 C)  98.3 F (36.8 C)   TempSrc: Oral  Oral   SpO2: 98% 100% 100%   Weight:  189 lb 11.2 oz (86 kg)    Height:  5\' 3"  (1.6 m)     General: Vital signs reviewed.  Patient is well-developed and well-nourished, in no acute distress and cooperative with exam.  Head: Normocephalic and atraumatic. Eyes: EOMI, conjunctivae normal, no scleral icterus.  Neck: Supple, trachea midline, normal ROM, no JVD, masses, thyromegaly, or carotid bruit present.  Cardiovascular: RRR, S1 normal, S2 normal, no murmurs, gallops, or rubs. Pulmonary/Chest: Clear to auscultation bilaterally, no wheezes, rales, or rhonchi. Abdominal: Soft, non-tender, non-distended, BS +, no masses, organomegaly, or guarding present.  Musculoskeletal: No joint deformities, erythema, or stiffness, ROM full and nontender. Extremities: No lower extremity edema bilaterally,  pulses symmetric and intact bilaterally. No cyanosis or clubbing. Neurological: A&O x3, Strength is normal and symmetric bilaterally, cranial nerve II-XII are grossly intact, no focal motor deficit, sensory intact to light touch bilaterally.  Skin: Warm,  dry and intact. No rashes or erythema. Psychiatric: Normal mood and affect. speech and behavior is normal. Cognition and memory are normal.  Labs. CBC    Component Value Date/Time   WBC 8.0 07/27/2016 1450   RBC 3.24 (L) 07/27/2016 1450   HGB 11.2 (L) 07/27/2016 1501   HCT 33.0 (L) 07/27/2016 1501   HCT 31.8 (L) 08/04/2015 1629   PLT 63 (L) 07/27/2016 1450   PLT 291 08/04/2015 1629   MCV 101.5 (H) 07/27/2016 1450   MCV 99 (H) 08/04/2015 1629   MCH 34.3 (H) 07/27/2016 1450   MCHC 33.7 07/27/2016 1450   RDW 17.3 (H) 07/27/2016 1450   RDW 13.7 08/04/2015 1629   LYMPHSABS 2.7 07/27/2016 1450   LYMPHSABS 0.8 08/04/2015 1629   MONOABS 0.7 07/27/2016 1450   EOSABS 0.1 07/27/2016 1450   EOSABS 0.4 08/04/2015 1629   BASOSABS 0.0 07/27/2016 1450   BASOSABS 0.0 08/04/2015 1629   CMP Latest Ref Rng & Units 07/27/2016 07/27/2016 11/30/2015  Glucose 65 - 99 mg/dL 409(W) 119(J) 93  BUN 6 - 20 mg/dL 47(W) 29(F) -  Creatinine 0.44 - 1.00 mg/dL >16.10(R) 60.45(W) -  Sodium 135 - 145 mmol/L 131(L) 132(L) 135  Potassium 3.5 - 5.1 mmol/L 3.9 3.8 3.8  Chloride 101 - 111 mmol/L 97(L) 94(L) -  CO2 22 - 32 mmol/L - 24 -  Calcium 8.9 - 10.3 mg/dL - 12.3(H) -  Total Protein 6.5 - 8.1 g/dL - 7.6 -  Total Bilirubin 0.3 - 1.2 mg/dL - 0.5 -  Alkaline Phos 38 - 126 U/L - 76 -  AST 15 - 41 U/L - 20 -  ALT 14 - 54 U/L - 17 -   Lipid Panel     Component Value Date/Time   CHOL 124 07/27/2016 1950   CHOL 185 03/29/2015 0903   TRIG 249 (H) 07/27/2016 1950   HDL 34 (L) 07/27/2016 1950   HDL 46 03/29/2015 0903   CHOLHDL 3.6 07/27/2016 1950   VLDL 50 (H) 07/27/2016 1950   LDLCALC 40 07/27/2016 1950   LDLCALC 111 (H) 03/29/2015 0903   Trop. 0.32>>0.29 TSH. 2.534 PT/INR. 13.9/1.06 APTT. 28  EKG: Normal sinus rhythm, no acute change.  CT head code stroke. FINDINGS: Brain: No evidence of acute infarction, hemorrhage, hydrocephalus, extra-axial collection or mass lesion/mass effect.  Vascular: No  hyperdense vessel. Mild calcific atherosclerosis of paraclinoid internal carotid arteries.  Skull: Normal. Negative for fracture or focal lesion.  Sinuses/Orbits: No acute finding.  Other: None.  ASPECTS [facility] Stroke Program Early CT Score)  - Ganglionic level infarction (caudate, lentiform nuclei, internal capsule, insula, M1-M3 cortex): 7  - Supraganglionic infarction (M4-M6 cortex): 3  Total score (0-10 with 10 being normal): 10  IMPRESSION: 1. No acute intracranial abnormality identified. Unremarkable CT of the head. 2. ASPECTS is 10  MRI and MRA HEAD. Brain: Small area of diffusion restriction in the right posterior frontal lobe, likely acute infarction. Left superior parietal small cortical focus of T2 FLAIR hyperintensity with mild decrease in ADC and small deep areas of restriction probably representing a early subacute infarction. Punctate focus of diffusion restriction in the left superior cerebellar hemisphere, likely acute infarct.  No abnormal susceptibility hypointensity to indicate intracranial hemorrhage. No hydrocephalus, extra-axial collection, or significant mass effect.  Vascular: As below  Skull and upper cervical spine: Normal marrow signal.  Sinuses/Orbits: Negative.  Other: None.  MRA HEAD FINDINGS  Internal carotid arteries:  Patent.  Anterior cerebral arteries:  Patent.  Middle cerebral arteries: Patent.  Anterior communicating artery: Patent.  Posterior communicating arteries:  Patent.  Posterior cerebral arteries:  Patent.  Basilar artery:  Patent.  Vertebral arteries:  Patent.  No evidence of high-grade stenosis, large vessel occlusion, or aneurysm.  IMPRESSION: 1. Small area of low diffusion consistent with acute infarction in right posterior frontal lobe. 2. Small left superior parietal cortical T2 FLAIR hyperintense focus with mildly diminished diffusion, distinct from the right frontal infarct,  probably a subacute infarction. 3. Punctate left superior cerebellar, likely acute infarct. 4. No intracranial hemorrhage identified. 5. Normal MRA of the head. No evidence of high-grade stenosis, large vessel occlusion, or aneurysm. These results will be called to the ordering clinician or representative by the Radiologist Assistant, and communication documented in the PACS or zVision Dashboard.  CTA of Head and Neck. FINDINGS: CT HEAD FINDINGS  BRAIN: No intraparenchymal hemorrhage, mass effect nor midline shift. Patient's known acute to subacute infarcts not apparent by CT. The ventricles and sulci are normal. No acute large vascular territory infarcts. No abnormal extra-axial fluid collections. Basal cisterns are patent.  VASCULAR: Unremarkable.  SKULL/SOFT TISSUES: No skull fracture. No significant soft tissue swelling.  ORBITS/SINUSES: The included ocular globes and orbital contents are normal.Trace paranasal sinus mucosal thickening. Mastoid air cells are well aerated.  OTHER: None.  CTA NECK  AORTIC ARCH: Normal appearance of the thoracic arch, 2 vessel arch is a normal variant. The origins of the innominate, left Common carotid artery and subclavian artery are widely patent.  RIGHT CAROTID SYSTEM: Common carotid artery is widely patent, coursing in a straight line fashion. Normal appearance of the carotid bifurcation without hemodynamically significant stenosis by NASCET criteria. Tortuous, mildly ectatic internal carotid artery can be seen with chronic hypertension.  LEFT CAROTID SYSTEM: Common carotid artery is widely patent, coursing in a straight line fashion. Normal appearance of the carotid bifurcation without hemodynamically significant stenosis by NASCET criteria. Tortuous, mildly ectatic internal carotid artery can be seen with chronic hypertension.  VERTEBRAL ARTERIES:Codominant vertebral artery's. Normal appearance of the vertebral arteries, which appear  widely patent.  SKELETON: No acute osseous process though bone windows have not been submitted.  OTHER NECK: Soft tissues of the neck are non-acute though, not tailored for evaluation. Dialysis catheter via RIGHT internal jugular venous approach, distal tip not imaged.  CTA HEAD  ANTERIOR CIRCULATION: Normal appearance of the cervical internal carotid arteries, petrous, cavernous and supra clinoid internal carotid arteries. Widely patent anterior communicating artery. Patent anterior and middle cerebral arteries. Mild luminal irregularity of the anterior cerebral arteries.  No large vessel occlusion, hemodynamically significant stenosis, dissection, luminal irregularity, contrast extravasation or aneurysm.  POSTERIOR CIRCULATION: Normal appearance of the vertebral arteries, vertebrobasilar junction and basilar artery, as well as main branch vessels. Patent posterior cerebral arteries. Robust bilateral posterior communicating arteries present. Mild luminal irregularity of the posterior cerebral arteries.  No large vessel occlusion, hemodynamically significant stenosis, dissection, luminal irregularity, contrast extravasation or aneurysm.  VENOUS SINUSES: Major dural venous sinuses are patent though not tailored for evaluation on this angiographic examination.  ANATOMIC VARIANTS: None.  DELAYED PHASE: No abnormal intracranial enhancement.  MIP images reviewed.  IMPRESSION: CT HEAD:  Negative (patient's known infarcts not evident by CT).  CTA NECK: No hemodynamically significant stenosis or acute vascular process. Mild changes of chronic hypertension.  CTA HEAD: No emergent large vessel occlusion or severe stenosis. Mild intracranial atherosclerosis.   Assessment & Plan by Problem: [place]aka Oppedisano is a 46 y.o. woman with PMH obesity, HTN, OSA on CPAP, ESRD on PD,  biopsy confirmed collapsing FSGS, migraines, and OSA on CPAP Came to the ED with complaint of  transient word finding difficulties and slurred speech lasting for half an hour.  CVA. Her MRI shows acute right posterior frontal lobe infarct and a punctate left superior cerebellar acute infarct. She also had a subacute left superior parietal cortical infarct which can explain her transient feeling of right arm heaviness approximately 2 weeks ago. CTA and MRA are negative for any significant stenosis. Her multifocal infarct are concerning for any embolic phenomena. -Nephrology is following. -Admit to telemetry for stroke workup. -Echo- pending-might need TEE to rule out embolic source. -A1c-pending -Aspirin 325 mg daily. -Lipitor 40 mg daily.  Elevated troponin. Most likely because of her renal disease. She denies any chest pain and EKG is without any acute change. -Trend troponin. -Repeat EKG in the morning.  ESRD with elevated BUN and creatinine. Her last documented BUN/creatinine in care everywhere was 32-50/9.9-11.42 in December 2017. Today she was found to have BUN of 53 and creatinine of 16.75. She was transitioned from hemodialysis to  peritoneal dialysis in January 2018 because of her busy schedule. She states that she is compliant with her daily peritoneal dialysis, she does in doors having some machine malfunctioned recently. She states that she was supposed to call them Monday, have not done it yet because of her schedule. -Nephrology is following-they are planning to continue peritoneal dialysis, they had a discussion about returning to hemodialysis but patient refused. She still has right tunneled catheter in place.  Hypercalcemia. Her corrected calcium was 13.1. She was complaining of constipation which was relatively new for her, most likely due to hypercalcemia. She do not have any change in her mental status. She is on calcium supplement. -Stop calcium supplement.  Hypertension. According to patient her blood pressure remained on softer side at home. She was on metoprolol 25  mg daily at home, according to patient that was because of her heart rate. As she gets tachycardia if she does not take it. She denies any history of A. Fib. She is currently normotensive. -We will hold metoprolol for permissive hypertension. It can be restarted if she develops tachycardia with heart rate persistently above 130.  FEN/GI: Regular diet, replete electrolytes as needed DVT ppx: Lovenox Code status: Full  Dispo: Admit patient to Observation with expected length of stay less than 2 midnights.  Signed: Althia FortsAdam Johnson, MD 07/27/2016, 4:56 PM  Pager: [phone]

## 2016-07-27 NOTE — Consult Note (Addendum)
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 Transitioned from HD to PD 05/2016. Does CCPD 7 days a week, very compliant, has been running high creatinines despite compliance with her PD regimen (review of outpt labs creatinines 17-19 despite good recorded PD clearance 06/2016 (she still has a TDC in place and there has been "discussion" about returning to HD which she states she absolutely will not do). Has been doing her exchanges but has had some machine malfunction recently.  Other than being "more tired than usual" which she had attributed to some low blood pressures at home, she had felt at baseline.  She was working at her school today and around 12:30 she was having some trouble communicating, felt like she was "repeating numbers". The school nurse told her that her speech was slurred, and she couldn't "get the right words out".  She presented to the ED, Code Stroke was called. At the time of her ED evaluation was neurologically intact. On MR imaging had a small area of low diffusion consistent with acute infarction in theright posterior frontal lobe, a small left superior parietal cortical  probable subacute infarction and a punctate left superior cerebellar, also likely acute infarct. Admitted for further evaluation. Neuro is evaluating for TIA vs sz.   Pt reports doing her CCPD without fail, 6 exchanges/24 hours (5 overnight with fill volume 2.25 liters, last fill of 2 liters, drains out and stays empty at around midday then does a manual fill of 2 liters. Has been having machine issues and has had 2 different cyclers - having 5-6 alarms/night.  Her most recent outpt labs showed Hb 11.3, creatinine 16.25, K 3.8, Ca 10.6, PTH 97, PD KT/V (February) of 2.25. EDW  previously 88, she feels lower. Has been running low blood pressures. Takes metoprolol - she says not for BP but for heart rate that increases if she discontinues it.    Past Medical History:  Diagnosis Date  . Allergy   . Anemia   . Anxiety   . Hypertension   . Migraine   . PONV (postoperative nausea and vomiting)   . Renal disorder    Dialysis M/W/F  . Sleep apnea    uses cpap    Past Surgical History:  Procedure Laterality Date  . BASCILIC VEIN TRANSPOSITION Left 09/30/2015   Procedure: LEFT ARM FIRST STAGE BASILIC VEIN TRANSPOSITION;  Surgeon: Nada LibmanVance W Brabham, MD;  Location: [facility] OR;  Service: Vascular;  Laterality: Left;  . BASCILIC VEIN TRANSPOSITION Left 11/30/2015   Procedure: SECOND STAGE BASILIC VEIN TRANSPOSITION, left arm;  Surgeon: Nada LibmanVance W Brabham, MD;  [facility];  Service: Vascular;  Laterality: Left;  . BREAST REDUCTION SURGERY Bilateral   . insertion hemodialysis catheter    . REDUCTION MAMMAPLASTY  1998     Family History  Problem Relation Age of Onset  . Cancer Mother     breast   . Kidney disease Mother   . Breast cancer Mother [id number]   Social History:  reports that she has never smoked. She has never used smokeless tobacco. She reports that she does not drink alcohol or use drugs.   Allergies  Allergen Reactions  . Ace Inhibitors Other (See Comments)    Uncontrollable Coughing & Choking  . Dye Fdc Red [Red  Dye] Other (See Comments)    Kidney failure  . Nsaids Other (See Comments)    Shoots creatine up  . Vancomycin Swelling, Rash and Other (See Comments)    Entire skin peeled, sores in throat  . Alcohol-Sulfur [Sulfur]     Itching, numbness of mouth  . Alcohol Itching    Hard liquor (Vodka)    Prior to Admission medications   Medication Sig Start Date End Date Taking? Authorizing Provider  ALPRAZolam Prudy Feeler) 0.5 MG tablet Take 1 tablet (0.5 mg total) by mouth 2 (two) times daily as needed for anxiety. Patient not taking: Reported on  02/28/2016 07/07/15   Lorie Phenix, MD  calcium acetate (PHOSLO) 667 MG capsule Take 1,334 mg by mouth 3 (three) times daily with meals.  Takes 2 tid ac   Historical Provider, MD  metoprolol succinate (TOPROL-XL) 50 MG 24 hr tablet Take 25 mg by mouth daily. Take with or immediately following a meal.    Historical Provider, MD  multivitamin (RENA-VIT) TABS tablet Take 1 tablet by mouth at bedtime. 08/31/15   Historical Provider, MD  sevelamer (RENAGEL) 400 MG tablet Take 400 mg by mouth 3 (three) times daily with meals. Takes renvela 800 3 tid with meals   Historical Provider, MD  temazepam (RESTORIL) 15 MG capsule Take 15 mg by mouth at bedtime as needed. 11/26/15   Historical Provider, MD  Protonix 40/day, gentamicin creme to exit site daily, flonase daily, calcitriol 0.5 mcg 3/day  Inpatient medications: None ordered yet  Review of Systems See HPI  Physical Exam:  Blood pressure (!) 132/98, pulse 96, temperature 98.3 F (36.8 C), temperature source Oral, resp. rate 17, SpO2 98 %.  Gen: Very articulate, awake, alert AAF NAD VS as noted Skin no rash, cyanosis Neck: no JVD, no bruits or LAN R IJ TDC in place with clean dry dressing Lungs clear without crackles or wheezes Regular, no rub or gallop Abdomen soft, PD cath exit left upper abdomen with dry dressing and anchored in place with a belt. No edema of the LE's Nonfunctional left upper arm AVF, pulsatile  Alert, Ox3, no focal deficit at this time Dialysis Access: PD catheter and R IJ [facility]  Labs:  Recent Labs Lab 07/27/16 1450 07/27/16 1501  NA 132* 131*  K 3.8 3.9  CL 94* 97*  CO2 24  --   GLUCOSE 106* 106*  BUN 53* 54*  CREATININE 16.75* >18.00*  CALCIUM 12.3*  --     Recent Labs Lab 07/27/16 1450  AST 20  ALT 17  ALKPHOS 76  BILITOT 0.5  PROT 7.6  ALBUMIN 3.0*    Recent Labs Lab 07/27/16 1450 07/27/16 1501  WBC 8.0  --   NEUTROABS 4.5  --   HGB 11.1* 11.2*  HCT 32.9* 33.0*  MCV 101.5*  --   PLT 63*   --     Recent Labs Lab 07/27/16 1455  TROPONINI 0.32*    Recent Labs Lab 07/27/16 1437  GLUCAP 121*    Xrays/Other Studies: Mr Maxine Glenn Head Wo Contrast  Result Date: 07/27/2016 CLINICAL DATA:  46 y/o  F; slurred speech. EXAM: MRI HEAD WITHOUT CONTRAST MRA HEAD WITHOUT CONTRAST TECHNIQUE: Multiplanar, multiecho pulse sequences of the brain and surrounding structures were obtained without intravenous contrast. Angiographic images of the head were obtained using MRA technique without contrast. COMPARISON:  07/27/2016 CT of the head. FINDINGS: MRI HEAD FINDINGS Brain: Small area of diffusion restriction in the right posterior frontal lobe, likely acute infarction.  Left superior parietal small cortical focus of T2 FLAIR hyperintensity with mild decrease in ADC and small deep areas of restriction probably representing a early subacute infarction. Punctate focus of diffusion restriction in the left superior cerebellar hemisphere, likely acute infarct. No abnormal susceptibility hypointensity to indicate intracranial hemorrhage. No hydrocephalus, extra-axial collection, or significant mass effect. Vascular: As below Skull and upper cervical spine: Normal marrow signal. Sinuses/Orbits: Negative. Other: None. MRA HEAD FINDINGS Internal carotid arteries:  Patent. Anterior cerebral arteries:  Patent. Middle cerebral arteries: Patent. Anterior communicating artery: Patent. Posterior communicating arteries:  Patent. Posterior cerebral arteries:  Patent. Basilar artery:  Patent. Vertebral arteries:  Patent. No evidence of high-grade stenosis, large vessel occlusion, or aneurysm. IMPRESSION: 1. Small area of low diffusion consistent with acute infarction in right posterior frontal lobe. 2. Small left superior parietal cortical T2 FLAIR hyperintense focus with mildly diminished diffusion, distinct from the right frontal infarct, probably a subacute infarction. 3. Punctate left superior cerebellar, likely acute  infarct. 4. No intracranial hemorrhage identified. 5. Normal MRA of the head. No evidence of high-grade stenosis, large vessel occlusion, or aneurysm. These results will be called to the ordering clinician or representative by the Radiologist Assistant, and communication documented in the PACS or zVision Dashboard. Electronically Signed   By: Mitzi Hansen M.D.   On: 07/27/2016 18:02   Mr Brain Wo Contrast  Result Date: 07/27/2016 CLINICAL DATA:  46 y/o  F; slurred speech. EXAM: MRI HEAD WITHOUT CONTRAST MRA HEAD WITHOUT CONTRAST TECHNIQUE: Multiplanar, multiecho pulse sequences of the brain and surrounding structures were obtained without intravenous contrast. Angiographic images of the head were obtained using MRA technique without contrast. COMPARISON:  07/27/2016 CT of the head. FINDINGS: MRI HEAD FINDINGS Brain: Small area of diffusion restriction in the right posterior frontal lobe, likely acute infarction. Left superior parietal small cortical focus of T2 FLAIR hyperintensity with mild decrease in ADC and small deep areas of restriction probably representing a early subacute infarction. Punctate focus of diffusion restriction in the left superior cerebellar hemisphere, likely acute infarct. No abnormal susceptibility hypointensity to indicate intracranial hemorrhage. No hydrocephalus, extra-axial collection, or significant mass effect. Vascular: As below Skull and upper cervical spine: Normal marrow signal. Sinuses/Orbits: Negative. Other: None. MRA HEAD FINDINGS Internal carotid arteries:  Patent. Anterior cerebral arteries:  Patent. Middle cerebral arteries: Patent. Anterior communicating artery: Patent. Posterior communicating arteries:  Patent. Posterior cerebral arteries:  Patent. Basilar artery:  Patent. Vertebral arteries:  Patent. No evidence of high-grade stenosis, large vessel occlusion, or aneurysm. IMPRESSION: 1. Small area of low diffusion consistent with acute infarction in right  posterior frontal lobe. 2. Small left superior parietal cortical T2 FLAIR hyperintense focus with mildly diminished diffusion, distinct from the right frontal infarct, probably a subacute infarction. 3. Punctate left superior cerebellar, likely acute infarct. 4. No intracranial hemorrhage identified. 5. Normal MRA of the head. No evidence of high-grade stenosis, large vessel occlusion, or aneurysm. These results will be called to the ordering clinician or representative by the Radiologist Assistant, and communication documented in the PACS or zVision Dashboard. Electronically Signed   By: Mitzi Hansen M.D.   On: 07/27/2016 18:02   Ct Head Code Stroke W/o Cm  Result Date: 07/27/2016 CLINICAL DATA:  Code stroke.  Intermittent slurred speech. EXAM: CT HEAD WITHOUT CONTRAST TECHNIQUE: Contiguous axial images were obtained from the base of the skull through the vertex without intravenous contrast. COMPARISON:  None. FINDINGS: Brain: No evidence of acute infarction, hemorrhage, hydrocephalus, extra-axial collection or mass  lesion/mass effect. Vascular: No hyperdense vessel. Mild calcific atherosclerosis of paraclinoid internal carotid arteries. Skull: Normal. Negative for fracture or focal lesion. Sinuses/Orbits: No acute finding. Other: None. ASPECTS [facility] Stroke Program Early CT Score) - Ganglionic level infarction (caudate, lentiform nuclei, internal capsule, insula, M1-M3 cortex): 7 - Supraganglionic infarction (M4-M6 cortex): 3 Total score (0-10 with 10 being normal): 10 IMPRESSION: 1. No acute intracranial abnormality identified. Unremarkable CT of the head. 2. ASPECTS is 10 These results were called by telephone at the time of interpretation on 07/27/2016 at 2:31 pm to Dr. Elita Quick, who verbally acknowledged these results. Electronically Signed   By: Mitzi Hansen M.D.   On: 07/27/2016 14:32    Background:     46 y.o. year-old AAF w/ESRD 2/2 FSGS, anemia, secondary HPT. On CCPD 7 days a  week, compliant, has been running high creatinines despite good compliance. (review of outpt labs creatinines 17-19 despite good recorded PD clearance 06/2016) Still has a TDC in place (prev on HD) and there has been "discussion" about returning to HD which she states she absolutely will not do). Has had some machine malfunction recently.  Came to ED after having some trouble communicating, felt like she was "repeating numbers", slurred speech observed by others. Code Stroke was called. At the time of her ED evaluation was neurologically intact. On MR imaging had acute R post frontal lobe and left superior cerebellar infarcts, and a subacute left superior parietal cortical infarct. Admitted for further evaluation. Neuro is evaluating for TIA vs sz. We are asked to see.   Assessment/Recommendations  1. TIA/stroke - imaging shows bilateral areas of stroke. For CT angio/ECHO/carotid studies and further evaluation by neuro. 2. ESRD - on CCPD. Creatinines high despite a clearance study 06/2016 with KT/V of 2.25 (good. Has TDC but says will not agree to return to HD. Continue CCPD for now. 3. Secondary HPT  - last outpt PTH 93 (low side for CKD5) and has been on calcitriol/ca acetate/renvela. Hypercalcemic. Hold ca acetate and calcitriol.  4. Anemia - Hb 11.1. Last ESA dose was Mircera 225 mcg given the week of 07/17/16 and does not need additional at this time 5. Constipation - relatively new issue. May be related to hypercalcemia (which should improve with discontinuation of Ca and calcitriol)   Camille Bal,  MD [facility] [phone] pager 07/27/2016, 7:37 PM

## 2016-07-27 NOTE — ED Notes (Signed)
Dr. Deretha EmoryZackowski at triage to evaluate patient.

## 2016-07-27 NOTE — ED Notes (Signed)
Dr. Deretha EmoryZackowski states to call a code stroke on patient

## 2016-07-27 NOTE — ED Notes (Signed)
Activated code stroke with [facility] @ 14:03 

## 2016-07-27 NOTE — ED Notes (Signed)
cbg was 121

## 2016-07-28 ENCOUNTER — Observation Stay (HOSPITAL_COMMUNITY): Payer: BC Managed Care – PPO

## 2016-07-28 ENCOUNTER — Observation Stay (HOSPITAL_BASED_OUTPATIENT_CLINIC_OR_DEPARTMENT_OTHER): Payer: BC Managed Care – PPO

## 2016-07-28 ENCOUNTER — Encounter (HOSPITAL_COMMUNITY)
Admission: EM | Disposition: A | Discharge status: Home / Self Care | Payer: Self-pay | Source: Home / Self Care | Attending: Internal Medicine

## 2016-07-28 DIAGNOSIS — I639 Cerebral infarction, unspecified: Secondary | ICD-10-CM | POA: Diagnosis not present

## 2016-07-28 DIAGNOSIS — Z6836 Body mass index (BMI) 36.0-36.9, adult: Secondary | ICD-10-CM | POA: Diagnosis not present

## 2016-07-28 DIAGNOSIS — R778 Other specified abnormalities of plasma proteins: Secondary | ICD-10-CM | POA: Diagnosis not present

## 2016-07-28 DIAGNOSIS — R002 Palpitations: Secondary | ICD-10-CM | POA: Diagnosis present

## 2016-07-28 DIAGNOSIS — D696 Thrombocytopenia, unspecified: Secondary | ICD-10-CM | POA: Diagnosis present

## 2016-07-28 DIAGNOSIS — Z881 Allergy status to other antibiotic agents status: Secondary | ICD-10-CM | POA: Diagnosis not present

## 2016-07-28 DIAGNOSIS — Z992 Dependence on renal dialysis: Secondary | ICD-10-CM | POA: Diagnosis not present

## 2016-07-28 DIAGNOSIS — G4733 Obstructive sleep apnea (adult) (pediatric): Secondary | ICD-10-CM | POA: Diagnosis present

## 2016-07-28 DIAGNOSIS — G43909 Migraine, unspecified, not intractable, without status migrainosus: Secondary | ICD-10-CM | POA: Diagnosis present

## 2016-07-28 DIAGNOSIS — Z7982 Long term (current) use of aspirin: Secondary | ICD-10-CM | POA: Diagnosis not present

## 2016-07-28 DIAGNOSIS — E669 Obesity, unspecified: Secondary | ICD-10-CM | POA: Diagnosis present

## 2016-07-28 DIAGNOSIS — G459 Transient cerebral ischemic attack, unspecified: Secondary | ICD-10-CM | POA: Diagnosis present

## 2016-07-28 DIAGNOSIS — Z91041 Radiographic dye allergy status: Secondary | ICD-10-CM | POA: Diagnosis not present

## 2016-07-28 DIAGNOSIS — F419 Anxiety disorder, unspecified: Secondary | ICD-10-CM | POA: Diagnosis present

## 2016-07-28 DIAGNOSIS — I6340 Cerebral infarction due to embolism of unspecified cerebral artery: Secondary | ICD-10-CM | POA: Diagnosis present

## 2016-07-28 DIAGNOSIS — K5900 Constipation, unspecified: Secondary | ICD-10-CM | POA: Diagnosis present

## 2016-07-28 DIAGNOSIS — Q211 Atrial septal defect: Secondary | ICD-10-CM | POA: Diagnosis not present

## 2016-07-28 DIAGNOSIS — Z7682 Awaiting organ transplant status: Secondary | ICD-10-CM | POA: Diagnosis not present

## 2016-07-28 DIAGNOSIS — N186 End stage renal disease: Secondary | ICD-10-CM | POA: Diagnosis present

## 2016-07-28 DIAGNOSIS — Z862 Personal history of diseases of the blood and blood-forming organs and certain disorders involving the immune mechanism: Secondary | ICD-10-CM | POA: Diagnosis not present

## 2016-07-28 DIAGNOSIS — I120 Hypertensive chronic kidney disease with stage 5 chronic kidney disease or end stage renal disease: Secondary | ICD-10-CM | POA: Diagnosis present

## 2016-07-28 DIAGNOSIS — D631 Anemia in chronic kidney disease: Secondary | ICD-10-CM | POA: Diagnosis present

## 2016-07-28 DIAGNOSIS — I638 Other cerebral infarction: Secondary | ICD-10-CM

## 2016-07-28 DIAGNOSIS — R4701 Aphasia: Secondary | ICD-10-CM | POA: Diagnosis present

## 2016-07-28 HISTORY — PX: LOOP RECORDER INSERTION: EP1214

## 2016-07-28 LAB — CBC
HEMATOCRIT: 33.2 % — AB (ref 36.0–46.0)
HEMOGLOBIN: 11.2 g/dL — AB (ref 12.0–15.0)
MCH: 34.5 pg — ABNORMAL HIGH (ref 26.0–34.0)
MCHC: 33.7 g/dL (ref 30.0–36.0)
MCV: 102.2 fL — ABNORMAL HIGH (ref 78.0–100.0)
Platelets: 73 10*3/uL — ABNORMAL LOW (ref 150–400)
RBC: 3.25 MIL/uL — ABNORMAL LOW (ref 3.87–5.11)
RDW: 17.5 % — ABNORMAL HIGH (ref 11.5–15.5)
WBC: 7.3 10*3/uL (ref 4.0–10.5)

## 2016-07-28 LAB — HIV ANTIBODY (ROUTINE TESTING W REFLEX): HIV SCREEN 4TH GENERATION: NONREACTIVE

## 2016-07-28 LAB — SAVE SMEAR

## 2016-07-28 LAB — RENAL FUNCTION PANEL
ALBUMIN: 2.8 g/dL — AB (ref 3.5–5.0)
ANION GAP: 15 (ref 5–15)
BUN: 58 mg/dL — ABNORMAL HIGH (ref 6–20)
CO2: 24 mmol/L (ref 22–32)
Calcium: 11.5 mg/dL — ABNORMAL HIGH (ref 8.9–10.3)
Chloride: 92 mmol/L — ABNORMAL LOW (ref 101–111)
Creatinine, Ser: 17.14 mg/dL — ABNORMAL HIGH (ref 0.44–1.00)
GFR calc Af Amer: 3 mL/min — ABNORMAL LOW (ref >60)
GFR calc non Af Amer: 2 mL/min — ABNORMAL LOW (ref >60)
Glucose, Bld: 93 mg/dL (ref 65–99)
PHOSPHORUS: 7.7 mg/dL — AB (ref 2.5–4.6)
POTASSIUM: 3.9 mmol/L (ref 3.5–5.1)
Sodium: 131 mmol/L — ABNORMAL LOW (ref 135–145)

## 2016-07-28 LAB — ANTITHROMBIN III: AntiThromb III Func: 117 % (ref 75–120)

## 2016-07-28 LAB — TROPONIN I
Troponin I: 0.28 ng/mL (ref <0.03)
Troponin I: 0.26 ng/mL (ref <0.03)

## 2016-07-28 LAB — HEMOGLOBIN A1C
Hgb A1c MFr Bld: 4.9 % (ref 4.8–5.6)
MEAN PLASMA GLUCOSE: 94 mg/dL

## 2016-07-28 LAB — ECHOCARDIOGRAM COMPLETE
HEIGHTINCHES: 63 in
Weight: 3312 oz

## 2016-07-28 SURGERY — LOOP RECORDER INSERTION

## 2016-07-28 MED ORDER — POLYETHYLENE GLYCOL 3350 17 G PO PACK
17.0000 g | PACK | Freq: Every day | ORAL | Status: DC
  Administered 2016-07-28: 17 g via ORAL
  Filled 2016-07-28: qty 1

## 2016-07-28 MED ORDER — DELFLEX-LC/1.5% DEXTROSE 344 MOSM/L IP SOLN
2000.0000 mL | INTRAPERITONEAL | Status: DC
  Administered 2016-07-28: 2000 mL via INTRAPERITONEAL

## 2016-07-28 MED ORDER — HEPARIN 1000 UNIT/ML FOR PERITONEAL DIALYSIS
2500.0000 [IU] | INTRAMUSCULAR | Status: DC | PRN
  Filled 2016-07-28: qty 2.5

## 2016-07-28 MED ORDER — ATORVASTATIN CALCIUM 40 MG PO TABS: 40.0000 mg | ORAL_TABLET | Freq: Every day | ORAL | Status: DC

## 2016-07-28 MED ORDER — LIDOCAINE HCL (PF) 1 % IJ SOLN
INTRAMUSCULAR | Status: AC
  Filled 2016-07-28: qty 30

## 2016-07-28 MED ORDER — GENTAMICIN SULFATE 0.1 % EX CREA
1.0000 "application " | TOPICAL_CREAM | Freq: Every day | CUTANEOUS | Status: DC
  Administered 2016-07-28 – 2016-07-29 (×2): 1 via TOPICAL
  Filled 2016-07-28: qty 15

## 2016-07-28 MED ORDER — ASPIRIN EC 81 MG PO TBEC
81.0000 mg | DELAYED_RELEASE_TABLET | Freq: Every day | ORAL | Status: DC
  Administered 2016-07-29: 81 mg via ORAL
  Filled 2016-07-28: qty 1

## 2016-07-28 MED ORDER — LIDOCAINE HCL (PF) 1 % IJ SOLN
INTRAMUSCULAR | Status: DC | PRN
  Administered 2016-07-28: 30 mL

## 2016-07-28 MED ORDER — DELFLEX-LC/1.5% DEXTROSE 344 MOSM/L IP SOLN
2250.0000 mL | INTRAPERITONEAL | Status: DC
  Administered 2016-07-27 – 2016-07-28 (×2): 15000 mL via INTRAPERITONEAL

## 2016-07-28 MED ORDER — LACTULOSE 10 GM/15ML PO SOLN
20.0000 g | Freq: Every day | ORAL | Status: DC
  Administered 2016-07-28 – 2016-07-29 (×2): 20 g via ORAL
  Filled 2016-07-28 (×2): qty 30

## 2016-07-28 SURGICAL SUPPLY — 2 items
LOOP REVEAL LINQSYS (Prosthesis & Implant Heart) ×3 IMPLANT
PACK LOOP INSERTION (CUSTOM PROCEDURE TRAY) ×3 IMPLANT

## 2016-07-28 NOTE — Progress Notes (Signed)
This Clinical research associatewriter went to do the mid day exchange PD but patient was off the unit for a procedure, told the nurse to call this Clinical research associatewriter whenever patient is back.

## 2016-07-28 NOTE — Progress Notes (Signed)
PT Cancellation Note  Patient Details Name: Stormy CardHerschelle Brancato MRN: [id number] DOB: [birth year]   Cancelled Treatment:    Reason Eval/Treat Not Completed: PT screened, no needs identified, will sign off. Pt is able to move around room without any difficulty. No functional deficits noted and no changes in mobility. PT will sign off for now. If any changes are noted, please re-order therapy evaluation.    Colin BroachSabra M. Aidin Doane PT, DPT  [phone]  07/28/2016, 10:06 AM

## 2016-07-28 NOTE — Consult Note (Signed)
ELECTROPHYSIOLOGY CONSULT NOTE  Patient ID: Elizabeth Anthony MRN: [id number], DOB/AGE: April 27, 1971   Admit date: 07/27/2016 Date of Consult: 07/28/2016 Consulting physician: Dr Roda Shutters Primary Physician: Megan Mans, MD  Reason for Consultation: Cryptogenic stroke; recommendations regarding Implantable Loop Recorder  History of Present Illness Elizabeth Anthony was admitted on 07/27/2016 with acute CVA. she has been monitored on telemetry which has demonstrated no arrhythmias.  MRI is reviewed by neurology and felt to be due to embolic source.   No cause has been identified.  He has large PFO by TCD but no DVT on dopplers.  She has multiple palpitations concerning for afib.  EP has been asked to evaluate for placement of an implantable loop recorder to monitor for atrial fibrillation.  Past Medical History Past Medical History:  Diagnosis Date  . Allergy   . Anemia   . Anxiety   . Hypertension   . Migraine   . PONV (postoperative nausea and vomiting)   . Renal disorder    Dialysis M/W/F  . Sleep apnea    uses cpap    Past Surgical History Past Surgical History:  Procedure Laterality Date  . BASCILIC VEIN TRANSPOSITION Left 09/30/2015   Procedure: LEFT ARM FIRST STAGE BASILIC VEIN TRANSPOSITION;  Surgeon: Nada Libman, MD;  Location: [facility] OR;  Service: Vascular;  Laterality: Left;  . BASCILIC VEIN TRANSPOSITION Left 11/30/2015   Procedure: SECOND STAGE BASILIC VEIN TRANSPOSITION, left arm;  Surgeon: Nada Libman, MD;  Location: [facility] OR;  Service: Vascular;  Laterality: Left;  . BREAST REDUCTION SURGERY Bilateral   . insertion hemodialysis catheter    . REDUCTION MAMMAPLASTY  1998    Allergies/Intolerances Allergies  Allergen Reactions  . Ace Inhibitors Other (See Comments)    Uncontrollable Coughing & Choking  . Dye Fdc Red [Red Dye] Other (See Comments)    Kidney failure  . Nsaids Other (See Comments)    Shoots creatine up  . Vancomycin Swelling, Rash and Other (See  Comments)    Entire skin peeled, sores in throat  . Alcohol-Sulfur [Sulfur] Other (See Comments)    Itching, numbness of mouth  . Alcohol Itching and Other (See Comments)    Hard liquor (Vodka)   Inpatient Medications . [START ON 07/29/2016] aspirin EC  81 mg Oral Daily  . dialysis solution 1.5% low-MG/low-CA  2,000 mL Intraperitoneal Q24H  . dialysis solution 1.5% low-MG/low-CA  2,250 mL Intraperitoneal Q24H  . gentamicin cream  1 application Topical Daily  . lactulose  20 g Oral Daily  . multivitamin  1 tablet Oral QHS  . pantoprazole  20 mg Oral Daily  . sevelamer carbonate  2,400 mg Oral TID WC    Social History Social History   Social History  . Marital status: Single    Spouse name: N/A  . Number of children: N/A  . Years of education: N/A   Occupational History  . Not on file.   Social History Main Topics  . Smoking status: Never Smoker  . Smokeless tobacco: Never Used  . Alcohol use No  . Drug use: No  . Sexual activity: Not on file   Other Topics Concern  . Not on file   Social History Narrative  . No narrative on file    Review of Systems General: No chills, fever, night sweats or weight changes  Cardiovascular:  No chest pain, dyspnea on exertion, edema, orthopnea, palpitations, paroxysmal nocturnal dyspnea Dermatological: No rash, lesions or masses Respiratory: No cough, dyspnea Urologic: No  hematuria, dysuria Abdominal: No nausea, vomiting, diarrhea, bright red blood per rectum, melena, or hematemesis Neurologic: No visual changes, weakness, changes in mental status All other systems reviewed and are otherwise negative except as noted above.  Physical Exam Blood pressure 111/73, pulse (!) 102, temperature 97.4 F (36.3 C), temperature source Oral, resp. rate 18, height 5\' 3"  (1.6 m), weight 207 lb (93.9 kg), last menstrual period 06/18/2016, SpO2 97 %.  General: Well developed, well appearing 46 y.o. female in no acute distress. HEENT:  Normocephalic, atraumatic. EOMs intact. Sclera nonicteric. Oropharynx clear.  Neck: Supple without bruits. No JVD. Lungs: Respirations regular and unlabored, CTA bilaterally. No wheezes, rales or rhonchi. Heart: RRR. S1, S2 present. No murmurs, rub, S3 or S4. Abdomen: Soft, non-tender, non-distended. BS present x 4 quadrants. No hepatosplenomegaly.  Extremities: No clubbing, cyanosis or edema. DP/PT/Radials 2+ and equal bilaterally. Psych: Normal affect. Neuro: Alert and oriented X 3. Moves all extremities spontaneously. Musculoskeletal: No kyphosis. Skin: Intact. Warm and dry. No rashes or petechiae in exposed areas.  R neck permcath in place   Labs Lab Results  Component Value Date   WBC 7.3 07/28/2016   HGB 11.2 (L) 07/28/2016   HCT 33.2 (L) 07/28/2016   MCV 102.2 (H) 07/28/2016   PLT 73 (L) 07/28/2016    Recent Labs Lab 07/27/16 1450  07/28/16 0138  NA 132*  < > 131*  K 3.8  < > 3.9  CL 94*  < > 92*  CO2 24  --  24  BUN 53*  < > 58*  CREATININE 16.75*  < > 17.14*  CALCIUM 12.3*  --  11.5*  PROT 7.6  --   --   BILITOT 0.5  --   --   ALKPHOS 76  --   --   ALT 17  --   --   AST 20  --   --   GLUCOSE 106*  < > 93  < > = values in this interval not displayed.  Recent Labs  07/27/16 1450  INR 1.06    Radiology/Studies Ct Angio Head W Or Wo Contrast  Result Date: 07/27/2016 CLINICAL DATA:  Followup stroke. History of migraine, end-stage renal disease on dialysis, hypertension, migraines. EXAM: CT ANGIOGRAPHY HEAD AND NECK TECHNIQUE: Multidetector CT imaging of the head and neck was performed using the standard protocol during bolus administration of intravenous contrast. Multiplanar CT image reconstructions and MIPs were obtained to evaluate the vascular anatomy. Carotid stenosis measurements (when applicable) are obtained utilizing NASCET criteria, using the distal internal carotid diameter as the denominator. CONTRAST:  50 cc Isovue 370 COMPARISON:  MRI and MRA of the  head July 27, 2016 at 1716 hours FINDINGS: CT HEAD FINDINGS BRAIN: No intraparenchymal hemorrhage, mass effect nor midline shift. Patient's known acute to subacute infarcts not apparent by CT. The ventricles and sulci are normal. No acute large vascular territory infarcts. No abnormal extra-axial fluid collections. Basal cisterns are patent. VASCULAR: Unremarkable. SKULL/SOFT TISSUES: No skull fracture. No significant soft tissue swelling. ORBITS/SINUSES: The included ocular globes and orbital contents are normal.Trace paranasal sinus mucosal thickening. Mastoid air cells are well aerated. OTHER: None. CTA NECK AORTIC ARCH: Normal appearance of the thoracic arch, 2 vessel arch is a normal variant. The origins of the innominate, left Common carotid artery and subclavian artery are widely patent. RIGHT CAROTID SYSTEM: Common carotid artery is widely patent, coursing in a straight line fashion. Normal appearance of the carotid bifurcation without hemodynamically significant stenosis by NASCET criteria.  Tortuous, mildly ectatic internal carotid artery can be seen with chronic hypertension. LEFT CAROTID SYSTEM: Common carotid artery is widely patent, coursing in a straight line fashion. Normal appearance of the carotid bifurcation without hemodynamically significant stenosis by NASCET criteria. Tortuous, mildly ectatic internal carotid artery can be seen with chronic hypertension. VERTEBRAL ARTERIES:Codominant vertebral artery's. Normal appearance of the vertebral arteries, which appear widely patent. SKELETON: No acute osseous process though bone windows have not been submitted. OTHER NECK: Soft tissues of the neck are non-acute though, not tailored for evaluation. Dialysis catheter via RIGHT internal jugular venous approach, distal tip not imaged. CTA HEAD ANTERIOR CIRCULATION: Normal appearance of the cervical internal carotid arteries, petrous, cavernous and supra clinoid internal carotid arteries. Widely patent  anterior communicating artery. Patent anterior and middle cerebral arteries. Mild luminal irregularity of the anterior cerebral arteries. No large vessel occlusion, hemodynamically significant stenosis, dissection, luminal irregularity, contrast extravasation or aneurysm. POSTERIOR CIRCULATION: Normal appearance of the vertebral arteries, vertebrobasilar junction and basilar artery, as well as main branch vessels. Patent posterior cerebral arteries. Robust bilateral posterior communicating arteries present. Mild luminal irregularity of the posterior cerebral arteries. No large vessel occlusion, hemodynamically significant stenosis, dissection, luminal irregularity, contrast extravasation or aneurysm. VENOUS SINUSES: Major dural venous sinuses are patent though not tailored for evaluation on this angiographic examination. ANATOMIC VARIANTS: None. DELAYED PHASE: No abnormal intracranial enhancement. MIP images reviewed. IMPRESSION: CT HEAD:  Negative (patient's known infarcts not evident by CT). CTA NECK: No hemodynamically significant stenosis or acute vascular process. Mild changes of chronic hypertension. CTA HEAD: No emergent large vessel occlusion or severe stenosis. Mild intracranial atherosclerosis. Electronically Signed   By: Awilda Metro M.D.   On: 07/27/2016 22:12   Dg Abd 1 View  Result Date: 07/28/2016 CLINICAL DATA:  Peritoneal dialysis catheter placement EXAM: ABDOMEN - 1 VIEW COMPARISON:  CT 03/06/2014 FINDINGS: Peritoneal dialysis catheter is noted in the left abdomen and coils in the midline of the pelvis. Nonobstructive bowel gas pattern. No free air organomegaly. Visualized lung bases clear. IMPRESSION: Peritoneal dialysis catheter coils in the midline of the pelvis. No acute findings. Electronically Signed   By: Charlett Nose M.D.   On: 07/28/2016 11:14   Ct Angio Neck W Or Wo Contrast  Result Date: 07/27/2016 CLINICAL DATA:  Followup stroke. History of migraine, end-stage renal disease  on dialysis, hypertension, migraines. EXAM: CT ANGIOGRAPHY HEAD AND NECK TECHNIQUE: Multidetector CT imaging of the head and neck was performed using the standard protocol during bolus administration of intravenous contrast. Multiplanar CT image reconstructions and MIPs were obtained to evaluate the vascular anatomy. Carotid stenosis measurements (when applicable) are obtained utilizing NASCET criteria, using the distal internal carotid diameter as the denominator. CONTRAST:  50 cc Isovue 370 COMPARISON:  MRI and MRA of the head July 27, 2016 at 1716 hours FINDINGS: CT HEAD FINDINGS BRAIN: No intraparenchymal hemorrhage, mass effect nor midline shift. Patient's known acute to subacute infarcts not apparent by CT. The ventricles and sulci are normal. No acute large vascular territory infarcts. No abnormal extra-axial fluid collections. Basal cisterns are patent. VASCULAR: Unremarkable. SKULL/SOFT TISSUES: No skull fracture. No significant soft tissue swelling. ORBITS/SINUSES: The included ocular globes and orbital contents are normal.Trace paranasal sinus mucosal thickening. Mastoid air cells are well aerated. OTHER: None. CTA NECK AORTIC ARCH: Normal appearance of the thoracic arch, 2 vessel arch is a normal variant. The origins of the innominate, left Common carotid artery and subclavian artery are widely patent. RIGHT CAROTID SYSTEM: Common carotid artery  is widely patent, coursing in a straight line fashion. Normal appearance of the carotid bifurcation without hemodynamically significant stenosis by NASCET criteria. Tortuous, mildly ectatic internal carotid artery can be seen with chronic hypertension. LEFT CAROTID SYSTEM: Common carotid artery is widely patent, coursing in a straight line fashion. Normal appearance of the carotid bifurcation without hemodynamically significant stenosis by NASCET criteria. Tortuous, mildly ectatic internal carotid artery can be seen with chronic hypertension. VERTEBRAL  ARTERIES:Codominant vertebral artery's. Normal appearance of the vertebral arteries, which appear widely patent. SKELETON: No acute osseous process though bone windows have not been submitted. OTHER NECK: Soft tissues of the neck are non-acute though, not tailored for evaluation. Dialysis catheter via RIGHT internal jugular venous approach, distal tip not imaged. CTA HEAD ANTERIOR CIRCULATION: Normal appearance of the cervical internal carotid arteries, petrous, cavernous and supra clinoid internal carotid arteries. Widely patent anterior communicating artery. Patent anterior and middle cerebral arteries. Mild luminal irregularity of the anterior cerebral arteries. No large vessel occlusion, hemodynamically significant stenosis, dissection, luminal irregularity, contrast extravasation or aneurysm. POSTERIOR CIRCULATION: Normal appearance of the vertebral arteries, vertebrobasilar junction and basilar artery, as well as main branch vessels. Patent posterior cerebral arteries. Robust bilateral posterior communicating arteries present. Mild luminal irregularity of the posterior cerebral arteries. No large vessel occlusion, hemodynamically significant stenosis, dissection, luminal irregularity, contrast extravasation or aneurysm. VENOUS SINUSES: Major dural venous sinuses are patent though not tailored for evaluation on this angiographic examination. ANATOMIC VARIANTS: None. DELAYED PHASE: No abnormal intracranial enhancement. MIP images reviewed. IMPRESSION: CT HEAD:  Negative (patient's known infarcts not evident by CT). CTA NECK: No hemodynamically significant stenosis or acute vascular process. Mild changes of chronic hypertension. CTA HEAD: No emergent large vessel occlusion or severe stenosis. Mild intracranial atherosclerosis. Electronically Signed   By: Awilda Metro M.D.   On: 07/27/2016 22:12   Mr Maxine Glenn Head Wo Contrast  Result Date: 07/27/2016 CLINICAL DATA:  46 y/o  F; slurred speech. EXAM: MRI HEAD  WITHOUT CONTRAST MRA HEAD WITHOUT CONTRAST TECHNIQUE: Multiplanar, multiecho pulse sequences of the brain and surrounding structures were obtained without intravenous contrast. Angiographic images of the head were obtained using MRA technique without contrast. COMPARISON:  07/27/2016 CT of the head. FINDINGS: MRI HEAD FINDINGS Brain: Small area of diffusion restriction in the right posterior frontal lobe, likely acute infarction. Left superior parietal small cortical focus of T2 FLAIR hyperintensity with mild decrease in ADC and small deep areas of restriction probably representing a early subacute infarction. Punctate focus of diffusion restriction in the left superior cerebellar hemisphere, likely acute infarct. No abnormal susceptibility hypointensity to indicate intracranial hemorrhage. No hydrocephalus, extra-axial collection, or significant mass effect. Vascular: As below Skull and upper cervical spine: Normal marrow signal. Sinuses/Orbits: Negative. Other: None. MRA HEAD FINDINGS Internal carotid arteries:  Patent. Anterior cerebral arteries:  Patent. Middle cerebral arteries: Patent. Anterior communicating artery: Patent. Posterior communicating arteries:  Patent. Posterior cerebral arteries:  Patent. Basilar artery:  Patent. Vertebral arteries:  Patent. No evidence of high-grade stenosis, large vessel occlusion, or aneurysm. IMPRESSION: 1. Small area of low diffusion consistent with acute infarction in right posterior frontal lobe. 2. Small left superior parietal cortical T2 FLAIR hyperintense focus with mildly diminished diffusion, distinct from the right frontal infarct, probably a subacute infarction. 3. Punctate left superior cerebellar, likely acute infarct. 4. No intracranial hemorrhage identified. 5. Normal MRA of the head. No evidence of high-grade stenosis, large vessel occlusion, or aneurysm. These results will be called to the ordering clinician or representative by  the Radiologist Assistant, and  communication documented in the PACS or zVision Dashboard. Electronically Signed   By: Mitzi Hansen M.D.   On: 07/27/2016 18:02   Mr Brain Wo Contrast  Result Date: 07/27/2016 CLINICAL DATA:  46 y/o  F; slurred speech. EXAM: MRI HEAD WITHOUT CONTRAST MRA HEAD WITHOUT CONTRAST TECHNIQUE: Multiplanar, multiecho pulse sequences of the brain and surrounding structures were obtained without intravenous contrast. Angiographic images of the head were obtained using MRA technique without contrast. COMPARISON:  07/27/2016 CT of the head. FINDINGS: MRI HEAD FINDINGS Brain: Small area of diffusion restriction in the right posterior frontal lobe, likely acute infarction. Left superior parietal small cortical focus of T2 FLAIR hyperintensity with mild decrease in ADC and small deep areas of restriction probably representing a early subacute infarction. Punctate focus of diffusion restriction in the left superior cerebellar hemisphere, likely acute infarct. No abnormal susceptibility hypointensity to indicate intracranial hemorrhage. No hydrocephalus, extra-axial collection, or significant mass effect. Vascular: As below Skull and upper cervical spine: Normal marrow signal. Sinuses/Orbits: Negative. Other: None. MRA HEAD FINDINGS Internal carotid arteries:  Patent. Anterior cerebral arteries:  Patent. Middle cerebral arteries: Patent. Anterior communicating artery: Patent. Posterior communicating arteries:  Patent. Posterior cerebral arteries:  Patent. Basilar artery:  Patent. Vertebral arteries:  Patent. No evidence of high-grade stenosis, large vessel occlusion, or aneurysm. IMPRESSION: 1. Small area of low diffusion consistent with acute infarction in right posterior frontal lobe. 2. Small left superior parietal cortical T2 FLAIR hyperintense focus with mildly diminished diffusion, distinct from the right frontal infarct, probably a subacute infarction. 3. Punctate left superior cerebellar, likely acute  infarct. 4. No intracranial hemorrhage identified. 5. Normal MRA of the head. No evidence of high-grade stenosis, large vessel occlusion, or aneurysm. These results will be called to the ordering clinician or representative by the Radiologist Assistant, and communication documented in the PACS or zVision Dashboard. Electronically Signed   By: Mitzi Hansen M.D.   On: 07/27/2016 18:02   Ct Head Code Stroke W/o Cm  Result Date: 07/27/2016 CLINICAL DATA:  Code stroke.  Intermittent slurred speech. EXAM: CT HEAD WITHOUT CONTRAST TECHNIQUE: Contiguous axial images were obtained from the base of the skull through the vertex without intravenous contrast. COMPARISON:  None. FINDINGS: Brain: No evidence of acute infarction, hemorrhage, hydrocephalus, extra-axial collection or mass lesion/mass effect. Vascular: No hyperdense vessel. Mild calcific atherosclerosis of paraclinoid internal carotid arteries. Skull: Normal. Negative for fracture or focal lesion. Sinuses/Orbits: No acute finding. Other: None. ASPECTS [facility] Stroke Program Early CT Score) - Ganglionic level infarction (caudate, lentiform nuclei, internal capsule, insula, M1-M3 cortex): 7 - Supraganglionic infarction (M4-M6 cortex): 3 Total score (0-10 with 10 being normal): 10 IMPRESSION: 1. No acute intracranial abnormality identified. Unremarkable CT of the head. 2. ASPECTS is 10 These results were called by telephone at the time of interpretation on 07/27/2016 at 2:31 pm to Dr. Elita Quick, who verbally acknowledged these results. Electronically Signed   By: Mitzi Hansen M.D.   On: 07/27/2016 14:32    Echocardiogram  Reviewed, EF normal, no significant valvular disease  12-lead ECG sinus rhythm Telemetry sinus rhythm   Assessment and Plan 1. Cryptogenic stroke Pt with palpitations concerning for afib.  MRI suggests multiple lesions worrisome for an embolic cause. I have spoken with Dr Roda Shutters.  Though there is a PFO, there is no DVT.   He feels that it is appropriate to proceed with ILR placement.  Pt will then have evaluate by Dr Excell Seltzer including TEE for possible  closure in the future.  We will proceed with loop recorder insertion to monitor for AF and to further evaluate her palpitations. The indication for loop recorder insertion / monitoring for AF in setting of cryptogenic stroke was discussed with the patient. The loop recorder insertion procedure was reviewed in detail including risks and benefits. 30 day monitor was offered as an alternative.  Risks to ILR placement include but are not limited to bleeding and infection. The patient expressed verbal understanding and agrees to proceed. The patient was also counseled regarding wound care and device follow-up.  Randolm IdolSigned, Rayann Jolley 07/28/2016, 4:27 PM

## 2016-07-28 NOTE — Progress Notes (Signed)
SLP Cancellation Note  Patient Details Name: Elizabeth Anthony MRN: [id number] DOB: 1970-10-18   Cancelled treatment:       Reason Eval/Treat Not Completed: SLP screened, no needs identified, will sign off   Blenda MountsCouture, Jorgen Wolfinger Laurice 07/28/2016, 10:53 AM

## 2016-07-28 NOTE — Progress Notes (Addendum)
STROKE TEAM PROGRESS NOTE   HISTORY OF PRESENT ILLNESS (per record) Elizabeth Anthony is a 46 y.o. female with multiple stroke risk factors despite her relatively young age.  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 Then at some point later she simply could no longer say what she wanted to say because the words could not come out.  At that point she decided to come to the hospital.  She denied having any motor or sensory symptoms on the right associated with the difficulty of speech.  She feels that her symptoms are coming and going and last for about 10 minutes.   LKW: 12:30 tpa given?: no -- NIHSS = 0 on exam   SUBJECTIVE (INTERVAL HISTORY) No family is at the bedside.  She stated that she is back to baseline. Language deficit resolved. MRI showed embolic stroke. Stroke work up so far only positive for large PFO on TCD bubble study. Will schedule for outpt TEE and referral to Dr. Excell Seltzer to consider PFO closure. Primary team reviewed tele and there is some concern of irregular heart beat.    OBJECTIVE Temp:  [97.4 F (36.3 C)-98.6 F (37 C)] 97.4 F (36.3 C) (03/16 0800) Pulse Rate:  [92-115] 102 (03/16 0800) Cardiac Rhythm: Sinus tachycardia (03/16 0700) Resp:  [9-21] 18 (03/16 0800) BP: (107-136)/(68-98) 111/73 (03/16 0800) SpO2:  [96 %-100 %] 97 % (03/16 0600) Weight:  [86 kg (189 lb 11.2 oz)-93.9 kg (207 lb)] 93.9 kg (207 lb) (03/16 0250)  CBC:   Recent Labs Lab 07/27/16 1450 07/27/16 1501 07/28/16 0741  WBC 8.0  --  7.3  NEUTROABS 4.5  --   --   HGB 11.1* 11.2* 11.2*  HCT 32.9* 33.0* 33.2*  MCV 101.5*  --  102.2*  PLT 63*  --  73*    Basic Metabolic Panel:   Recent Labs Lab 07/27/16 1450 07/27/16 1501 07/28/16 0138  NA 132* 131* 131*  K 3.8 3.9 3.9  CL 94* 97* 92*  CO2 24  --  24  GLUCOSE 106* 106* 93  BUN 53* 54* 58*  CREATININE 16.75* >18.00* 17.14*  CALCIUM 12.3*   --  11.5*  PHOS  --   --  7.7*    Lipid Panel:     Component Value Date/Time   CHOL 124 07/27/2016 1950   CHOL 185 03/29/2015 0903   TRIG 249 (H) 07/27/2016 1950   HDL 34 (L) 07/27/2016 1950   HDL 46 03/29/2015 0903   CHOLHDL 3.6 07/27/2016 1950   VLDL 50 (H) 07/27/2016 1950   LDLCALC 40 07/27/2016 1950   LDLCALC 111 (H) 03/29/2015 0903   HgbA1c:  Lab Results  Component Value Date   HGBA1C 4.9 07/27/2016   Urine Drug Screen: No results found for: LABOPIA, COCAINSCRNUR, LABBENZ, AMPHETMU, THCU, LABBARB    IMAGING I have personally reviewed the radiological images below and agree with the radiology interpretations.  Ct Angio Head W Or Wo Contrast 07/27/2016 CT HEAD:   Negative (patient's known infarcts not evident by CT).  CTA HEAD:  No emergent large vessel occlusion or severe stenosis. Mild intracranial atherosclerosis. CTA NECK:  No hemodynamically significant stenosis or acute vascular process. Mild changes of chronic hypertension.   Dg Abd 1 View 07/28/2016 Peritoneal dialysis catheter coils in the midline of the pelvis. No acute findings.   Mr Maxine Glenn Head Wo Contrast 07/27/2016 1. Small area of low diffusion consistent with acute  infarction in right posterior frontal lobe.  2. Small left superior parietal cortical T2 FLAIR hyperintense focus with mildly diminished diffusion, distinct from the right frontal infarct, probably a subacute infarction.  3. Punctate left superior cerebellar, likely acute infarct.  4. No intracranial hemorrhage identified.  5. Normal MRA of the head. No evidence of high-grade stenosis, large vessel occlusion, or aneurysm.   Ct Head Code Stroke W/o Cm 07/27/2016 1. No acute intracranial abnormality identified. Unremarkable CT of the head.  2. ASPECTS is 10   LE venous doppler - no DVT  TCD bubble study - spencer degree IV at rest, but full curtain effect on valsalva.  TTE - Normal LV size with EF 65-70%, mild LV hypertrophy. Normal RV    size and systolic function. No significant valvular   abnormalities.   PHYSICAL EXAM  Temp:  [97.4 F (36.3 C)-98.6 F (37 C)] 97.4 F (36.3 C) (03/16 0800) Pulse Rate:  [92-115] 102 (03/16 0800) Resp:  [11-21] 18 (03/16 0800) BP: (107-136)/(68-98) 111/73 (03/16 0800) SpO2:  [96 %-100 %] 97 % (03/16 0600) Weight:  [189 lb 11.2 oz (86 kg)-207 lb (93.9 kg)] 207 lb (93.9 kg) (03/16 0250)  General - Well nourished, well developed, in no apparent distress.  Ophthalmologic - Sharp disc margins OU.   Cardiovascular - Regular rate and rhythm.  Mental Status -  Level of arousal and orientation to time, place, and person were intact. Language including expression, naming, repetition, comprehension was assessed and found intact. Attention span and concentration were normal. Recent and remote memory were intact. Fund of Knowledge was assessed and was intact.  Cranial Nerves II - XII - II - Visual field intact OU. III, IV, VI - Extraocular movements intact. V - Facial sensation intact bilaterally. VII - Facial movement intact bilaterally. VIII - Hearing & vestibular intact bilaterally. X - Palate elevates symmetrically. XI - Chin turning & shoulder shrug intact bilaterally. XII - Tongue protrusion intact.  Motor Strength - The patient's strength was normal in all extremities and pronator drift was absent.  Bulk was normal and fasciculations were absent.   Motor Tone - Muscle tone was assessed at the neck and appendages and was normal.  Reflexes - The patient's reflexes were 1+ in all extremities and she had no pathological reflexes.  Sensory - Light touch, temperature/pinprick, vibration and proprioception, and Romberg testing were assessed and were symmetrical.    Coordination - The patient had normal movements in the hands and feet with no ataxia or dysmetria.  Tremor was absent.  Gait and Station - The patient's transfers, posture, gait, station, and turns were observed as  normal.   ASSESSMENT/PLAN Ms. Elizabeth Anthony is a 46 y.o. female with history of OSA, ESRD on dialysis, migraine headaches, HTN, and anxiety presenting with transient speech difficulties. She did not receive IV t-PA due to resolution of deficits.   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    Resultant  Deficit resolved  MRI - multifocal acute infarcts including right frontal, left parietal, left cerebellar  MRA - normal  CTA H&N - unremarkable  Lower extremity venous Dopplers - no DVT  TCD bubble study - large PFO  2D Echo - EF 65-70%  Loop recorder placed to rule out afib  Recommend outpt TEE study arrangement and refer to cardiology Dr. Excell Seltzerooper for consideration of PFO closure in the future  LDL - 40  HgbA1c - 4.9  VTE prophylaxis - none Diet renal with fluid restriction Fluid restriction: 1200 mL Fluid; Room service appropriate? Yes; Fluid consistency: Thin  No antithrombotic prior to admission, now on aspirin 325 mg daily. Changed to ASA 81mg  due to thrombocytopenia. Do not think statin is needed at this time.   Patient counseled to be compliant with her antithrombotic medications  Ongoing aggressive stroke risk factor management  Therapy recommendations: No needs identified  Disposition: expect discharge today  PFO  TCD showed large PFO  LE venous doppler negative  No high risk for DVT, will not need MRV pelvis at this time  Recommend outpt TEE setup and refer to Dr. Excell Seltzer for PFO closure consideration in the future  Thrombocytopenia   Platelet 73  On ASA 81mg    Continue monitoring  Hypertension  BP runs somewhat low.  Permissive hypertension (OK if < 220/120) but gradually normalize in 5-7 days  Long-term BP goal normotensive  Other Stroke Risk Factors  Obesity, Body mass index is 36.67 kg/m., recommend weight loss, diet and exercise as appropriate    Migraines  Obstructive sleep apnea, on CPAP at home  Other Active Problems  End-stage renal disease on dialysis  Anemia - likely due to ESRD  Hyponatremia - Na 131  Hospital day # 0  Neurology will sign off. Please call with questions. Pt will follow up with Dr. Roda Shutters at [facility] in about 6 weeks. Thanks for the consult.  Marvel Plan, MD PhD Stroke Neurology 07/28/2016 9:56 PM   To contact Stroke Continuity provider, please refer to [email or web address]. After hours, contact General Neurology

## 2016-07-28 NOTE — Progress Notes (Signed)
  Echocardiogram 2D Echocardiogram has been performed.  Tye SavoyCasey N Rianne Degraaf 07/28/2016, 2:12 PM

## 2016-07-28 NOTE — Progress Notes (Signed)
*  PRELIMINARY RESULTS* Vascular Ultrasound Transcranial Doppler with Bubbles has been completed.   High Intensity Transient Signals (HITS) were heard at rest and with Valsalva, suggestive of a large patent foramen ovale (PFO).  Bilateral lower extremity venous duplex completed. Bilateral lower extremities are negative for deep vein thrombosis. There is no evidence of Baker's cyst bilaterally.  07/28/2016 3:02 PM Gertie FeyMichelle Jonnette Nuon, BS, RVT, RDCS, RDMS

## 2016-07-28 NOTE — Care Management Note (Signed)
Case Management Note  Patient Details  Name: Elizabeth Anthony MRN: [id number] Date of Birth: July 30, 1970  Subjective/Objective:    Pt admitted with CVA. She is from home alone.                 Action/Plan: No f/u per PT/OT and no DME needs. CM following.    Expected Discharge Date:                  Expected Discharge Plan:  Home/Self Care  In-House Referral:     Discharge planning Services     Post Acute Care Choice:    Choice offered to:     DME Arranged:    DME Agency:     HH Arranged:    HH Agency:     Status of Service:  In process, will continue to follow  If discussed at Long Length of Stay Meetings, dates discussed:    Additional Comments:  Kermit BaloKelli F Hunter Bachar, RN 07/28/2016, 11:42 AM

## 2016-07-28 NOTE — Progress Notes (Signed)
OT Cancellation Note  Patient Details Name: Elizabeth Anthony MRN: [id number] DOB: 03/29/1971   Cancelled Treatment:     OT screened. Pt appears to have returned to baseline. OT signing off.   [facility] Center[facility]  Belky Mundo, OT/L  [phone] 07/28/2016 07/28/2016, 11:19 AM

## 2016-07-28 NOTE — Code Documentation (Signed)
46 y.o. female w/ PMHx of peritoneal dialysis, OSA, migraine, HTN and anxiety. Pt was at work today when she had an acute onset of difficulty with speech. This difficulty worsened to the point where she "couldn't get the words out". This worsening prompted her to present to the ED. On arrival pt w/ no neuro findings. NMIHSS 0. Pt reports speech back to baseline. CT (-) ASPECTS 10. tPA not given d/t symptoms being too mild to treat. MRI to be obtained. Bedside handoff w/ ED RN Shawna OrleansMelanie. Code stroke to be canceled per neurologist.

## 2016-07-28 NOTE — Progress Notes (Signed)
Patient ID: Elizabeth Anthony, female   DOB: 08-Jun-1970, 46 y.o.   MRN: [id number]  [facility] Progress Note    Subjective:   Feels okay but constipated, difficulty draining yesterday.    Objective:   BP 111/73 (BP Location: Right Arm)   Pulse (!) 102   Temp 97.4 F (36.3 C) (Oral)   Resp 18   Ht 5\' 3"  (1.6 m)   Wt 93.9 kg (207 lb)   SpO2 97%   BMI 36.67 kg/m   Intake/Output: I/O last 3 completed shifts: In: [id number] [Other:15000] Out: -    Intake/Output this shift:  No intake/output data recorded. Weight change:   Physical Exam: Gen:NAD CVS: no rub Resp:cta Abd:+BS, soft, NT Ext: no edema  Labs: BMET  Recent Labs Lab 07/27/16 1450 07/27/16 1501 07/28/16 0138  NA 132* 131* 131*  K 3.8 3.9 3.9  CL 94* 97* 92*  CO2 24  --  24  GLUCOSE 106* 106* 93  BUN 53* 54* 58*  CREATININE 16.75* >18.00* 17.14*  ALBUMIN 3.0*  --  2.8*  CALCIUM 12.3*  --  11.5*  PHOS  --   --  7.7*   CBC  Recent Labs Lab 07/27/16 1450 07/27/16 1501 07/28/16 0741  WBC 8.0  --  7.3  NEUTROABS 4.5  --   --   HGB 11.1* 11.2* 11.2*  HCT 32.9* 33.0* 33.2*  MCV 101.5*  --  102.2*  PLT 63*  --  73*    @IMGRELPRIORS @ Medications:    . aspirin  325 mg Oral Daily  . atorvastatin  40 mg Oral q1800  . dialysis solution 1.5% low-MG/low-CA  2,000 mL Intraperitoneal Q24H  . dialysis solution 1.5% low-MG/low-CA  2,250 mL Intraperitoneal Q24H  . gentamicin cream  1 application Topical Daily  . multivitamin  1 tablet Oral QHS  . pantoprazole  20 mg Oral Daily  . sevelamer carbonate  2,400 mg Oral TID WC   CCPD: 6 exchanges, 2.25 liters fill volume, dwell time 1.5 hrs, fill time 20 min, drain time 30 min.    Assessment/ Plan:   1. TIA/strokes- worrisome for embolic source.  Work up underway and Neuro is following. 2. ESRD: Continue with CCPD but will try to increase fill volumes to help with clearance and will also check KUB for PD catheter tip placement as this may  effect clearance as well as UF.  Will also start miralax due to constipation  3. Anemia: micera 225mcg every 2 weeks (last given 07/17/16) 4. CKD-MBD: elevated calcium so calcitriol and phoslo on hold.  Continue with renvela 5. Nutrition: renal diet 6. Hypertension: stable  Irena CordsJoseph A. Catlynn Grondahl, MD [facility], [facility] Pager [phone] 07/28/2016, 10:13 AM

## 2016-07-28 NOTE — Progress Notes (Signed)
Subjective: Elizabeth Anthony is in good spirits this morning, pleasant and conversational. Denies any new neurologic symptoms or discomfort. Had some difficulty sleeping overnight but attributed this to hospital setting. Tolerated peritoneal dialysis overnight, reported 1-2 alarms.  Telemetry reviewed, brief irregular tachyrrhythmia around 5 am concerning for Afib. Some sinus tach to 120s around 7AM.  Objective: Vital signs in last 24 hours: Vitals:   07/28/16 0200 07/28/16 0250 07/28/16 0400 07/28/16 0600  BP: 107/83  116/68 127/83  Pulse: 97  97 (!) 101  Resp: 18  18 18   Temp: 98.2 F (36.8 C)  98.3 F (36.8 C) 98.2 F (36.8 C)  TempSrc: Oral  Oral Oral  SpO2: 96%  96% 97%  Weight:  207 lb (93.9 kg)    Height:        Intake/Output Summary (Last 24 hours) at 07/28/16 0843 Last data filed at 07/27/16 2315  Gross per 24 hour  Intake            15000 ml  Output                0 ml  Net            15000 ml    Physical Exam General appearance: Young woman resting comfortably in bed, in no distress, conversational HENT: Normocephalic, atraumatic, moist MMs Cardiovascular: Regular rate and rhythm, no murmurs, rubs, gallops Respiratory/Chest: Clear to ausculation bilaterally, normal work of breathing Abdomen: Soft, non-tender, non-distended Skin: Warm, dry, intact Neuro: Cranial nerves grossly intact, alert and oriented, strength, sensation, and coordination intact Psych: Positive affect  Labs / Imaging / Procedures: CBC Latest Ref Rng & Units 07/28/2016 07/27/2016 07/27/2016  WBC 4.0 - 10.5 K/uL 7.3 - 8.0  Hemoglobin 12.0 - 15.0 g/dL 11.2(L) 11.2(L) 11.1(L)  Hematocrit 36.0 - 46.0 % 33.2(L) 33.0(L) 32.9(L)  Platelets 150 - 400 K/uL 73(L) - 63(L)   BMP Latest Ref Rng & Units 07/28/2016 07/27/2016 07/27/2016  Glucose 65 - 99 mg/dL 93 161(W106(H) 960(A106(H)  BUN 6 - 20 mg/dL 54(U58(H) 98(J54(H) 19(J53(H)  Creatinine 0.44 - 1.00 mg/dL 47.82(N17.14(H) >56.21(H>18.00(H) 08.65(H16.75(H)  BUN/Creat Ratio 9 - 23 - - -  Sodium  135 - 145 mmol/L 131(L) 131(L) 132(L)  Potassium 3.5 - 5.1 mmol/L 3.9 3.9 3.8  Chloride 101 - 111 mmol/L 92(L) 97(L) 94(L)  CO2 22 - 32 mmol/L 24 - 24  Calcium 8.9 - 10.3 mg/dL 11.5(H) - 12.3(H)   CTA head and neck - no significant stenosis or acute vascular changes, no large vessel occlusion  Assessment/Plan: Elizabeth Anthony is a 46 y.o. woman with PMH ESRD, obesity, OSA, HTN admitted for acute stroke.   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 Presented neurologically intact on 3/15 following transient aphasic episode. MRI revealed multifocal infarcts, some subacute, cardioembolic until proven otherwise, concern for PFO and cryptogenic emboli vs afib. CTA and MRA are negative for any significant stenosis. Continues to be neurologically intact. - Neurology following - EPS consulted, inserting loop recorder - Follow lower extremity doppler US - Telemetry - questionable irregular rhythm this AM - Transcranial dopplers prelim with concern for large PFO - TTE, LVEF 65-70%, mild LVH, G1DD, no valve abnml - TSH,  Lipids, A1c wnl - Follow hypercoag panel - Aspirin 325 mg daily, adjust to 81 mg daily per neuro - Lipitor 40 mg daily, dc per neuro - Can follow up outpatient for TEE and eval for PFO closure  ESRD on PD, with elevated BUN and creatinine. Her last documented BUN/creatinine apprxo 50 and 11.4 in 04/2016. On admit creatinine elevated > 18 and BUN 54-58. Averse to HD due to busy schedule. She has been very compliant with her PD.  -- Nephrology following, appreciate recs -- Continue peritoneal dialysis inpatient -- Follow up with Dr. Darrick Penna outpatient, has appointment  Monday  Hypercalcemia,  corrected calcium was 13.1. She is on calcium supplement. - hold calcium supplement. - trend RFP  Elevated troponin. Trended 0.3>0.29>0.28. No chest pain, no EKG changes. - Secondary to ESRD and decreased clearance  Hypertension, normotensive this admission. Takes metoprolol 25 mg daily at home for unspecified tachycardia per patient. Denies history of arrhythmia  - hold metoprolol for permissive hypertension  FEN/GI: Regular diet, replete electrolytes as needed DVT ppx: Lovenox  Dispo: Anticipated discharge tomorrow.    LOS: 0 days   Althia Forts, MD 07/28/2016, 8:43 AM Pager: [phone]

## 2016-07-28 NOTE — Interval H&P Note (Signed)
History and Physical Interval Note:  07/28/2016 4:35 PM  Elizabeth Anthony  has presented today for surgery, with the diagnosis of stroke  The various methods of treatment have been discussed with the patient and family. After consideration of risks, benefits and other options for treatment, the patient has consented to  Procedure(s): Loop Recorder Insertion (N/A) as a surgical intervention .  The patient's history has been reviewed, patient examined, no change in status, stable for surgery.  I have reviewed the patient's chart and labs.  Questions were answered to the patient's satisfaction.     Hillis RangeJames Doni Bacha

## 2016-07-28 NOTE — H&P (View-Only) (Signed)
ELECTROPHYSIOLOGY CONSULT NOTE  Patient ID: Elizabeth Anthony MRN: [id number], DOB/AGE: April 27, 1971   Admit date: 07/27/2016 Date of Consult: 07/28/2016 Consulting physician: Dr Roda Shutters Primary Physician: Megan Mans, MD  Reason for Consultation: Cryptogenic stroke; recommendations regarding Implantable Loop Recorder  History of Present Illness Elizabeth Anthony was admitted on 07/27/2016 with acute CVA. she has been monitored on telemetry which has demonstrated no arrhythmias.  MRI is reviewed by neurology and felt to be due to embolic source.   No cause has been identified.  He has large PFO by TCD but no DVT on dopplers.  She has multiple palpitations concerning for afib.  EP has been asked to evaluate for placement of an implantable loop recorder to monitor for atrial fibrillation.  Past Medical History Past Medical History:  Diagnosis Date  . Allergy   . Anemia   . Anxiety   . Hypertension   . Migraine   . PONV (postoperative nausea and vomiting)   . Renal disorder    Dialysis M/W/F  . Sleep apnea    uses cpap    Past Surgical History Past Surgical History:  Procedure Laterality Date  . BASCILIC VEIN TRANSPOSITION Left 09/30/2015   Procedure: LEFT ARM FIRST STAGE BASILIC VEIN TRANSPOSITION;  Surgeon: Nada Libman, MD;  Location: [facility] OR;  Service: Vascular;  Laterality: Left;  . BASCILIC VEIN TRANSPOSITION Left 11/30/2015   Procedure: SECOND STAGE BASILIC VEIN TRANSPOSITION, left arm;  Surgeon: Nada Libman, MD;  Location: [facility] OR;  Service: Vascular;  Laterality: Left;  . BREAST REDUCTION SURGERY Bilateral   . insertion hemodialysis catheter    . REDUCTION MAMMAPLASTY  1998    Allergies/Intolerances Allergies  Allergen Reactions  . Ace Inhibitors Other (See Comments)    Uncontrollable Coughing & Choking  . Dye Fdc Red [Red Dye] Other (See Comments)    Kidney failure  . Nsaids Other (See Comments)    Shoots creatine up  . Vancomycin Swelling, Rash and Other (See  Comments)    Entire skin peeled, sores in throat  . Alcohol-Sulfur [Sulfur] Other (See Comments)    Itching, numbness of mouth  . Alcohol Itching and Other (See Comments)    Hard liquor (Vodka)   Inpatient Medications . [START ON 07/29/2016] aspirin EC  81 mg Oral Daily  . dialysis solution 1.5% low-MG/low-CA  2,000 mL Intraperitoneal Q24H  . dialysis solution 1.5% low-MG/low-CA  2,250 mL Intraperitoneal Q24H  . gentamicin cream  1 application Topical Daily  . lactulose  20 g Oral Daily  . multivitamin  1 tablet Oral QHS  . pantoprazole  20 mg Oral Daily  . sevelamer carbonate  2,400 mg Oral TID WC    Social History Social History   Social History  . Marital status: Single    Spouse name: N/A  . Number of children: N/A  . Years of education: N/A   Occupational History  . Not on file.   Social History Main Topics  . Smoking status: Never Smoker  . Smokeless tobacco: Never Used  . Alcohol use No  . Drug use: No  . Sexual activity: Not on file   Other Topics Concern  . Not on file   Social History Narrative  . No narrative on file    Review of Systems General: No chills, fever, night sweats or weight changes  Cardiovascular:  No chest pain, dyspnea on exertion, edema, orthopnea, palpitations, paroxysmal nocturnal dyspnea Dermatological: No rash, lesions or masses Respiratory: No cough, dyspnea Urologic: No  hematuria, dysuria Abdominal: No nausea, vomiting, diarrhea, bright red blood per rectum, melena, or hematemesis Neurologic: No visual changes, weakness, changes in mental status All other systems reviewed and are otherwise negative except as noted above.  Physical Exam Blood pressure 111/73, pulse (!) 102, temperature 97.4 F (36.3 C), temperature source Oral, resp. rate 18, height 5\' 3"  (1.6 m), weight 207 lb (93.9 kg), last menstrual period 06/18/2016, SpO2 97 %.  General: Well developed, well appearing 46 y.o. female in no acute distress. HEENT:  Normocephalic, atraumatic. EOMs intact. Sclera nonicteric. Oropharynx clear.  Neck: Supple without bruits. No JVD. Lungs: Respirations regular and unlabored, CTA bilaterally. No wheezes, rales or rhonchi. Heart: RRR. S1, S2 present. No murmurs, rub, S3 or S4. Abdomen: Soft, non-tender, non-distended. BS present x 4 quadrants. No hepatosplenomegaly.  Extremities: No clubbing, cyanosis or edema. DP/PT/Radials 2+ and equal bilaterally. Psych: Normal affect. Neuro: Alert and oriented X 3. Moves all extremities spontaneously. Musculoskeletal: No kyphosis. Skin: Intact. Warm and dry. No rashes or petechiae in exposed areas.  R neck permcath in place   Labs Lab Results  Component Value Date   WBC 7.3 07/28/2016   HGB 11.2 (L) 07/28/2016   HCT 33.2 (L) 07/28/2016   MCV 102.2 (H) 07/28/2016   PLT 73 (L) 07/28/2016    Recent Labs Lab 07/27/16 1450  07/28/16 0138  NA 132*  < > 131*  K 3.8  < > 3.9  CL 94*  < > 92*  CO2 24  --  24  BUN 53*  < > 58*  CREATININE 16.75*  < > 17.14*  CALCIUM 12.3*  --  11.5*  PROT 7.6  --   --   BILITOT 0.5  --   --   ALKPHOS 76  --   --   ALT 17  --   --   AST 20  --   --   GLUCOSE 106*  < > 93  < > = values in this interval not displayed.  Recent Labs  07/27/16 1450  INR 1.06    Radiology/Studies Ct Angio Head W Or Wo Contrast  Result Date: 07/27/2016 CLINICAL DATA:  Followup stroke. History of migraine, end-stage renal disease on dialysis, hypertension, migraines. EXAM: CT ANGIOGRAPHY HEAD AND NECK TECHNIQUE: Multidetector CT imaging of the head and neck was performed using the standard protocol during bolus administration of intravenous contrast. Multiplanar CT image reconstructions and MIPs were obtained to evaluate the vascular anatomy. Carotid stenosis measurements (when applicable) are obtained utilizing NASCET criteria, using the distal internal carotid diameter as the denominator. CONTRAST:  50 cc Isovue 370 COMPARISON:  MRI and MRA of the  head July 27, 2016 at 1716 hours FINDINGS: CT HEAD FINDINGS BRAIN: No intraparenchymal hemorrhage, mass effect nor midline shift. Patient's known acute to subacute infarcts not apparent by CT. The ventricles and sulci are normal. No acute large vascular territory infarcts. No abnormal extra-axial fluid collections. Basal cisterns are patent. VASCULAR: Unremarkable. SKULL/SOFT TISSUES: No skull fracture. No significant soft tissue swelling. ORBITS/SINUSES: The included ocular globes and orbital contents are normal.Trace paranasal sinus mucosal thickening. Mastoid air cells are well aerated. OTHER: None. CTA NECK AORTIC ARCH: Normal appearance of the thoracic arch, 2 vessel arch is a normal variant. The origins of the innominate, left Common carotid artery and subclavian artery are widely patent. RIGHT CAROTID SYSTEM: Common carotid artery is widely patent, coursing in a straight line fashion. Normal appearance of the carotid bifurcation without hemodynamically significant stenosis by NASCET criteria.  Tortuous, mildly ectatic internal carotid artery can be seen with chronic hypertension. LEFT CAROTID SYSTEM: Common carotid artery is widely patent, coursing in a straight line fashion. Normal appearance of the carotid bifurcation without hemodynamically significant stenosis by NASCET criteria. Tortuous, mildly ectatic internal carotid artery can be seen with chronic hypertension. VERTEBRAL ARTERIES:Codominant vertebral artery's. Normal appearance of the vertebral arteries, which appear widely patent. SKELETON: No acute osseous process though bone windows have not been submitted. OTHER NECK: Soft tissues of the neck are non-acute though, not tailored for evaluation. Dialysis catheter via RIGHT internal jugular venous approach, distal tip not imaged. CTA HEAD ANTERIOR CIRCULATION: Normal appearance of the cervical internal carotid arteries, petrous, cavernous and supra clinoid internal carotid arteries. Widely patent  anterior communicating artery. Patent anterior and middle cerebral arteries. Mild luminal irregularity of the anterior cerebral arteries. No large vessel occlusion, hemodynamically significant stenosis, dissection, luminal irregularity, contrast extravasation or aneurysm. POSTERIOR CIRCULATION: Normal appearance of the vertebral arteries, vertebrobasilar junction and basilar artery, as well as main branch vessels. Patent posterior cerebral arteries. Robust bilateral posterior communicating arteries present. Mild luminal irregularity of the posterior cerebral arteries. No large vessel occlusion, hemodynamically significant stenosis, dissection, luminal irregularity, contrast extravasation or aneurysm. VENOUS SINUSES: Major dural venous sinuses are patent though not tailored for evaluation on this angiographic examination. ANATOMIC VARIANTS: None. DELAYED PHASE: No abnormal intracranial enhancement. MIP images reviewed. IMPRESSION: CT HEAD:  Negative (patient's known infarcts not evident by CT). CTA NECK: No hemodynamically significant stenosis or acute vascular process. Mild changes of chronic hypertension. CTA HEAD: No emergent large vessel occlusion or severe stenosis. Mild intracranial atherosclerosis. Electronically Signed   By: Awilda Metro M.D.   On: 07/27/2016 22:12   Dg Abd 1 View  Result Date: 07/28/2016 CLINICAL DATA:  Peritoneal dialysis catheter placement EXAM: ABDOMEN - 1 VIEW COMPARISON:  CT 03/06/2014 FINDINGS: Peritoneal dialysis catheter is noted in the left abdomen and coils in the midline of the pelvis. Nonobstructive bowel gas pattern. No free air organomegaly. Visualized lung bases clear. IMPRESSION: Peritoneal dialysis catheter coils in the midline of the pelvis. No acute findings. Electronically Signed   By: Charlett Nose M.D.   On: 07/28/2016 11:14   Ct Angio Neck W Or Wo Contrast  Result Date: 07/27/2016 CLINICAL DATA:  Followup stroke. History of migraine, end-stage renal disease  on dialysis, hypertension, migraines. EXAM: CT ANGIOGRAPHY HEAD AND NECK TECHNIQUE: Multidetector CT imaging of the head and neck was performed using the standard protocol during bolus administration of intravenous contrast. Multiplanar CT image reconstructions and MIPs were obtained to evaluate the vascular anatomy. Carotid stenosis measurements (when applicable) are obtained utilizing NASCET criteria, using the distal internal carotid diameter as the denominator. CONTRAST:  50 cc Isovue 370 COMPARISON:  MRI and MRA of the head July 27, 2016 at 1716 hours FINDINGS: CT HEAD FINDINGS BRAIN: No intraparenchymal hemorrhage, mass effect nor midline shift. Patient's known acute to subacute infarcts not apparent by CT. The ventricles and sulci are normal. No acute large vascular territory infarcts. No abnormal extra-axial fluid collections. Basal cisterns are patent. VASCULAR: Unremarkable. SKULL/SOFT TISSUES: No skull fracture. No significant soft tissue swelling. ORBITS/SINUSES: The included ocular globes and orbital contents are normal.Trace paranasal sinus mucosal thickening. Mastoid air cells are well aerated. OTHER: None. CTA NECK AORTIC ARCH: Normal appearance of the thoracic arch, 2 vessel arch is a normal variant. The origins of the innominate, left Common carotid artery and subclavian artery are widely patent. RIGHT CAROTID SYSTEM: Common carotid artery  is widely patent, coursing in a straight line fashion. Normal appearance of the carotid bifurcation without hemodynamically significant stenosis by NASCET criteria. Tortuous, mildly ectatic internal carotid artery can be seen with chronic hypertension. LEFT CAROTID SYSTEM: Common carotid artery is widely patent, coursing in a straight line fashion. Normal appearance of the carotid bifurcation without hemodynamically significant stenosis by NASCET criteria. Tortuous, mildly ectatic internal carotid artery can be seen with chronic hypertension. VERTEBRAL  ARTERIES:Codominant vertebral artery's. Normal appearance of the vertebral arteries, which appear widely patent. SKELETON: No acute osseous process though bone windows have not been submitted. OTHER NECK: Soft tissues of the neck are non-acute though, not tailored for evaluation. Dialysis catheter via RIGHT internal jugular venous approach, distal tip not imaged. CTA HEAD ANTERIOR CIRCULATION: Normal appearance of the cervical internal carotid arteries, petrous, cavernous and supra clinoid internal carotid arteries. Widely patent anterior communicating artery. Patent anterior and middle cerebral arteries. Mild luminal irregularity of the anterior cerebral arteries. No large vessel occlusion, hemodynamically significant stenosis, dissection, luminal irregularity, contrast extravasation or aneurysm. POSTERIOR CIRCULATION: Normal appearance of the vertebral arteries, vertebrobasilar junction and basilar artery, as well as main branch vessels. Patent posterior cerebral arteries. Robust bilateral posterior communicating arteries present. Mild luminal irregularity of the posterior cerebral arteries. No large vessel occlusion, hemodynamically significant stenosis, dissection, luminal irregularity, contrast extravasation or aneurysm. VENOUS SINUSES: Major dural venous sinuses are patent though not tailored for evaluation on this angiographic examination. ANATOMIC VARIANTS: None. DELAYED PHASE: No abnormal intracranial enhancement. MIP images reviewed. IMPRESSION: CT HEAD:  Negative (patient's known infarcts not evident by CT). CTA NECK: No hemodynamically significant stenosis or acute vascular process. Mild changes of chronic hypertension. CTA HEAD: No emergent large vessel occlusion or severe stenosis. Mild intracranial atherosclerosis. Electronically Signed   By: Awilda Metro M.D.   On: 07/27/2016 22:12   Mr Maxine Glenn Head Wo Contrast  Result Date: 07/27/2016 CLINICAL DATA:  46 y/o  F; slurred speech. EXAM: MRI HEAD  WITHOUT CONTRAST MRA HEAD WITHOUT CONTRAST TECHNIQUE: Multiplanar, multiecho pulse sequences of the brain and surrounding structures were obtained without intravenous contrast. Angiographic images of the head were obtained using MRA technique without contrast. COMPARISON:  07/27/2016 CT of the head. FINDINGS: MRI HEAD FINDINGS Brain: Small area of diffusion restriction in the right posterior frontal lobe, likely acute infarction. Left superior parietal small cortical focus of T2 FLAIR hyperintensity with mild decrease in ADC and small deep areas of restriction probably representing a early subacute infarction. Punctate focus of diffusion restriction in the left superior cerebellar hemisphere, likely acute infarct. No abnormal susceptibility hypointensity to indicate intracranial hemorrhage. No hydrocephalus, extra-axial collection, or significant mass effect. Vascular: As below Skull and upper cervical spine: Normal marrow signal. Sinuses/Orbits: Negative. Other: None. MRA HEAD FINDINGS Internal carotid arteries:  Patent. Anterior cerebral arteries:  Patent. Middle cerebral arteries: Patent. Anterior communicating artery: Patent. Posterior communicating arteries:  Patent. Posterior cerebral arteries:  Patent. Basilar artery:  Patent. Vertebral arteries:  Patent. No evidence of high-grade stenosis, large vessel occlusion, or aneurysm. IMPRESSION: 1. Small area of low diffusion consistent with acute infarction in right posterior frontal lobe. 2. Small left superior parietal cortical T2 FLAIR hyperintense focus with mildly diminished diffusion, distinct from the right frontal infarct, probably a subacute infarction. 3. Punctate left superior cerebellar, likely acute infarct. 4. No intracranial hemorrhage identified. 5. Normal MRA of the head. No evidence of high-grade stenosis, large vessel occlusion, or aneurysm. These results will be called to the ordering clinician or representative by  the Radiologist Assistant, and  communication documented in the PACS or zVision Dashboard. Electronically Signed   By: Mitzi Hansen M.D.   On: 07/27/2016 18:02   Mr Brain Wo Contrast  Result Date: 07/27/2016 CLINICAL DATA:  46 y/o  F; slurred speech. EXAM: MRI HEAD WITHOUT CONTRAST MRA HEAD WITHOUT CONTRAST TECHNIQUE: Multiplanar, multiecho pulse sequences of the brain and surrounding structures were obtained without intravenous contrast. Angiographic images of the head were obtained using MRA technique without contrast. COMPARISON:  07/27/2016 CT of the head. FINDINGS: MRI HEAD FINDINGS Brain: Small area of diffusion restriction in the right posterior frontal lobe, likely acute infarction. Left superior parietal small cortical focus of T2 FLAIR hyperintensity with mild decrease in ADC and small deep areas of restriction probably representing a early subacute infarction. Punctate focus of diffusion restriction in the left superior cerebellar hemisphere, likely acute infarct. No abnormal susceptibility hypointensity to indicate intracranial hemorrhage. No hydrocephalus, extra-axial collection, or significant mass effect. Vascular: As below Skull and upper cervical spine: Normal marrow signal. Sinuses/Orbits: Negative. Other: None. MRA HEAD FINDINGS Internal carotid arteries:  Patent. Anterior cerebral arteries:  Patent. Middle cerebral arteries: Patent. Anterior communicating artery: Patent. Posterior communicating arteries:  Patent. Posterior cerebral arteries:  Patent. Basilar artery:  Patent. Vertebral arteries:  Patent. No evidence of high-grade stenosis, large vessel occlusion, or aneurysm. IMPRESSION: 1. Small area of low diffusion consistent with acute infarction in right posterior frontal lobe. 2. Small left superior parietal cortical T2 FLAIR hyperintense focus with mildly diminished diffusion, distinct from the right frontal infarct, probably a subacute infarction. 3. Punctate left superior cerebellar, likely acute  infarct. 4. No intracranial hemorrhage identified. 5. Normal MRA of the head. No evidence of high-grade stenosis, large vessel occlusion, or aneurysm. These results will be called to the ordering clinician or representative by the Radiologist Assistant, and communication documented in the PACS or zVision Dashboard. Electronically Signed   By: Mitzi Hansen M.D.   On: 07/27/2016 18:02   Ct Head Code Stroke W/o Cm  Result Date: 07/27/2016 CLINICAL DATA:  Code stroke.  Intermittent slurred speech. EXAM: CT HEAD WITHOUT CONTRAST TECHNIQUE: Contiguous axial images were obtained from the base of the skull through the vertex without intravenous contrast. COMPARISON:  None. FINDINGS: Brain: No evidence of acute infarction, hemorrhage, hydrocephalus, extra-axial collection or mass lesion/mass effect. Vascular: No hyperdense vessel. Mild calcific atherosclerosis of paraclinoid internal carotid arteries. Skull: Normal. Negative for fracture or focal lesion. Sinuses/Orbits: No acute finding. Other: None. ASPECTS [facility] Stroke Program Early CT Score) - Ganglionic level infarction (caudate, lentiform nuclei, internal capsule, insula, M1-M3 cortex): 7 - Supraganglionic infarction (M4-M6 cortex): 3 Total score (0-10 with 10 being normal): 10 IMPRESSION: 1. No acute intracranial abnormality identified. Unremarkable CT of the head. 2. ASPECTS is 10 These results were called by telephone at the time of interpretation on 07/27/2016 at 2:31 pm to Dr. Elita Quick, who verbally acknowledged these results. Electronically Signed   By: Mitzi Hansen M.D.   On: 07/27/2016 14:32    Echocardiogram  Reviewed, EF normal, no significant valvular disease  12-lead ECG sinus rhythm Telemetry sinus rhythm   Assessment and Plan 1. Cryptogenic stroke Pt with palpitations concerning for afib.  MRI suggests multiple lesions worrisome for an embolic cause. I have spoken with Dr Roda Shutters.  Though there is a PFO, there is no DVT.   He feels that it is appropriate to proceed with ILR placement.  Pt will then have evaluate by Dr Excell Seltzer including TEE for possible  closure in the future.  We will proceed with loop recorder insertion to monitor for AF and to further evaluate her palpitations. The indication for loop recorder insertion / monitoring for AF in setting of cryptogenic stroke was discussed with the patient. The loop recorder insertion procedure was reviewed in detail including risks and benefits. 30 day monitor was offered as an alternative.  Risks to ILR placement include but are not limited to bleeding and infection. The patient expressed verbal understanding and agrees to proceed. The patient was also counseled regarding wound care and device follow-up.  Randolm IdolSigned, Clint Strupp 07/28/2016, 4:27 PM

## 2016-07-29 DIAGNOSIS — Q2112 Patent foramen ovale: Secondary | ICD-10-CM

## 2016-07-29 DIAGNOSIS — T85611A Breakdown (mechanical) of intraperitoneal dialysis catheter, initial encounter: Secondary | ICD-10-CM

## 2016-07-29 DIAGNOSIS — Q211 Atrial septal defect: Secondary | ICD-10-CM

## 2016-07-29 LAB — RENAL FUNCTION PANEL
Albumin: 2.8 g/dL — ABNORMAL LOW (ref 3.5–5.0)
Anion gap: 14 (ref 5–15)
BUN: 49 mg/dL — AB (ref 6–20)
CHLORIDE: 94 mmol/L — AB (ref 101–111)
CO2: 24 mmol/L (ref 22–32)
CREATININE: 16.23 mg/dL — AB (ref 0.44–1.00)
Calcium: 10.4 mg/dL — ABNORMAL HIGH (ref 8.9–10.3)
GFR calc Af Amer: 3 mL/min — ABNORMAL LOW (ref >60)
GFR, EST NON AFRICAN AMERICAN: 2 mL/min — AB (ref >60)
Glucose, Bld: 126 mg/dL — ABNORMAL HIGH (ref 65–99)
Phosphorus: 6.2 mg/dL — ABNORMAL HIGH (ref 2.5–4.6)
Potassium: 3.5 mmol/L (ref 3.5–5.1)
Sodium: 132 mmol/L — ABNORMAL LOW (ref 135–145)

## 2016-07-29 LAB — CBC
HCT: 31.4 % — ABNORMAL LOW (ref 36.0–46.0)
Hemoglobin: 10.7 g/dL — ABNORMAL LOW (ref 12.0–15.0)
MCH: 34.7 pg — ABNORMAL HIGH (ref 26.0–34.0)
MCHC: 34.1 g/dL (ref 30.0–36.0)
MCV: 101.9 fL — ABNORMAL HIGH (ref 78.0–100.0)
PLATELETS: 78 10*3/uL — AB (ref 150–400)
RBC: 3.08 MIL/uL — ABNORMAL LOW (ref 3.87–5.11)
RDW: 17.5 % — AB (ref 11.5–15.5)
WBC: 6.2 10*3/uL (ref 4.0–10.5)

## 2016-07-29 LAB — PROTEIN S, TOTAL: Protein S Ag, Total: 150 % (ref 60–150)

## 2016-07-29 LAB — PROTEIN S ACTIVITY: Protein S Activity: 82 % (ref 63–140)

## 2016-07-29 LAB — PROTEIN C ACTIVITY: Protein C Activity: 169 % (ref 73–180)

## 2016-07-29 MED ORDER — DELFLEX-LC/1.5% DEXTROSE 344 MOSM/L IP SOLN: 2500.0000 mL | INTRAPERITONEAL | Status: DC

## 2016-07-29 MED ORDER — DELFLEX-LC/2.5% DEXTROSE 394 MOSM/L IP SOLN: INTRAPERITONEAL | Status: DC

## 2016-07-29 MED ORDER — LANTHANUM CARBONATE 1000 MG PO CHEW: 1000.0000 mg | CHEWABLE_TABLET | Freq: Three times a day (TID) | ORAL | 0 refills | Status: DC

## 2016-07-29 MED ORDER — LANTHANUM CARBONATE 500 MG PO CHEW
1000.0000 mg | CHEWABLE_TABLET | Freq: Three times a day (TID) | ORAL | Status: DC
  Administered 2016-07-29: 1000 mg via ORAL
  Filled 2016-07-29 (×2): qty 2

## 2016-07-29 MED ORDER — ASPIRIN 81 MG PO TBEC: 81.0000 mg | DELAYED_RELEASE_TABLET | Freq: Every day | ORAL | 0 refills | Status: DC

## 2016-07-29 MED ORDER — HEPARIN 1000 UNIT/ML FOR PERITONEAL DIALYSIS
2500.0000 [IU] | INTRAMUSCULAR | Status: DC | PRN
  Filled 2016-07-29: qty 2.5

## 2016-07-29 NOTE — Progress Notes (Signed)
Patient ID: Elizabeth Anthony, female   DOB: [birth year], 46 y.o.   MRN: [id number] S: "I am ready to go home"   She did well with CCPD last night and did have a bowel movement with the miralax and lactulose combo.  Better drainage and only alarms were due to scale errors. O:BP 104/70 (BP Location: Right Arm)   Pulse (!) 110   Temp 98.5 F (36.9 C) (Oral)   Resp 18   Ht 5\' 3"  (1.6 m)   Wt 85.7 kg (188 lb 15 oz)   LMP 06/18/2016   SpO2 98%   BMI 33.47 kg/m   Intake/Output Summary (Last 24 hours) at 07/29/16 0848 Last data filed at 07/29/16 0831  Gross per 24 hour  Intake                0 ml  Output              577 ml  Net             -577 ml   Intake/Output: I/O last 3 completed shifts: In: 40981 [Other:15000] Out: 437 [Other:437]  Intake/Output this shift:  Total I/O In: -  Out: 577 [Other:577] Weight change: -0.347 kg (-12.3 oz) Gen:WD WN AAF in NAD CVS: no rub Resp:cta XBJ:YNWGNF, PD cath in place, no drainage or discharge Ext: no edema,  Vascular access:  RIJ TDC in place.  PD catheter is coiled in the pelvis midline (should be in the left pelvic gutter but is still in good position and has not flipped upon itself)   Recent Labs Lab 07/27/16 1450 07/27/16 1501 07/28/16 0138  NA 132* 131* 131*  K 3.8 3.9 3.9  CL 94* 97* 92*  CO2 24  --  24  GLUCOSE 106* 106* 93  BUN 53* 54* 58*  CREATININE 16.75* >18.00* 17.14*  ALBUMIN 3.0*  --  2.8*  CALCIUM 12.3*  --  11.5*  PHOS  --   --  7.7*  AST 20  --   --   ALT 17  --   --    Liver Function Tests:  Recent Labs Lab 07/27/16 1450 07/28/16 0138  AST 20  --   ALT 17  --   ALKPHOS 76  --   BILITOT 0.5  --   PROT 7.6  --   ALBUMIN 3.0* 2.8*   No results for input(s): LIPASE, AMYLASE in the last 168 hours. No results for input(s): AMMONIA in the last 168 hours. CBC:  Recent Labs Lab 07/27/16 1450 07/27/16 1501 07/28/16 0741  WBC 8.0  --  7.3  NEUTROABS 4.5  --   --   HGB 11.1* 11.2* 11.2*  HCT 32.9*  33.0* 33.2*  MCV 101.5*  --  102.2*  PLT 63*  --  73*   Cardiac Enzymes:  Recent Labs Lab 07/27/16 1455 07/27/16 1950 07/28/16 0138 07/28/16 0741  TROPONINI 0.32* 0.29* 0.28* 0.26*   CBG:  Recent Labs Lab 07/27/16 1437  GLUCAP 121*    Iron Studies: No results for input(s): IRON, TIBC, TRANSFERRIN, FERRITIN in the last 72 hours. Studies/Results: Ct Angio Head W Or Wo Contrast  Result Date: 07/27/2016 CLINICAL DATA:  Followup stroke. History of migraine, end-stage renal disease on dialysis, hypertension, migraines. EXAM: CT ANGIOGRAPHY HEAD AND NECK TECHNIQUE: Multidetector CT imaging of the head and neck was performed using the standard protocol during bolus administration of intravenous contrast. Multiplanar CT image reconstructions and MIPs were obtained to evaluate the vascular anatomy. Carotid  stenosis measurements (when applicable) are obtained utilizing NASCET criteria, using the distal internal carotid diameter as the denominator. CONTRAST:  50 cc Isovue 370 COMPARISON:  MRI and MRA of the head July 27, 2016 at 1716 hours FINDINGS: CT HEAD FINDINGS BRAIN: No intraparenchymal hemorrhage, mass effect nor midline shift. Patient's known acute to subacute infarcts not apparent by CT. The ventricles and sulci are normal. No acute large vascular territory infarcts. No abnormal extra-axial fluid collections. Basal cisterns are patent. VASCULAR: Unremarkable. SKULL/SOFT TISSUES: No skull fracture. No significant soft tissue swelling. ORBITS/SINUSES: The included ocular globes and orbital contents are normal.Trace paranasal sinus mucosal thickening. Mastoid air cells are well aerated. OTHER: None. CTA NECK AORTIC ARCH: Normal appearance of the thoracic arch, 2 vessel arch is a normal variant. The origins of the innominate, left Common carotid artery and subclavian artery are widely patent. RIGHT CAROTID SYSTEM: Common carotid artery is widely patent, coursing in a straight line fashion. Normal  appearance of the carotid bifurcation without hemodynamically significant stenosis by NASCET criteria. Tortuous, mildly ectatic internal carotid artery can be seen with chronic hypertension. LEFT CAROTID SYSTEM: Common carotid artery is widely patent, coursing in a straight line fashion. Normal appearance of the carotid bifurcation without hemodynamically significant stenosis by NASCET criteria. Tortuous, mildly ectatic internal carotid artery can be seen with chronic hypertension. VERTEBRAL ARTERIES:Codominant vertebral artery's. Normal appearance of the vertebral arteries, which appear widely patent. SKELETON: No acute osseous process though bone windows have not been submitted. OTHER NECK: Soft tissues of the neck are non-acute though, not tailored for evaluation. Dialysis catheter via RIGHT internal jugular venous approach, distal tip not imaged. CTA HEAD ANTERIOR CIRCULATION: Normal appearance of the cervical internal carotid arteries, petrous, cavernous and supra clinoid internal carotid arteries. Widely patent anterior communicating artery. Patent anterior and middle cerebral arteries. Mild luminal irregularity of the anterior cerebral arteries. No large vessel occlusion, hemodynamically significant stenosis, dissection, luminal irregularity, contrast extravasation or aneurysm. POSTERIOR CIRCULATION: Normal appearance of the vertebral arteries, vertebrobasilar junction and basilar artery, as well as main branch vessels. Patent posterior cerebral arteries. Robust bilateral posterior communicating arteries present. Mild luminal irregularity of the posterior cerebral arteries. No large vessel occlusion, hemodynamically significant stenosis, dissection, luminal irregularity, contrast extravasation or aneurysm. VENOUS SINUSES: Major dural venous sinuses are patent though not tailored for evaluation on this angiographic examination. ANATOMIC VARIANTS: None. DELAYED PHASE: No abnormal intracranial enhancement. MIP  images reviewed. IMPRESSION: CT HEAD:  Negative (patient's known infarcts not evident by CT). CTA NECK: No hemodynamically significant stenosis or acute vascular process. Mild changes of chronic hypertension. CTA HEAD: No emergent large vessel occlusion or severe stenosis. Mild intracranial atherosclerosis. Electronically Signed   By: Awilda Metroourtnay  Bloomer M.D.   On: 07/27/2016 22:12   Dg Abd 1 View  Result Date: 07/28/2016 CLINICAL DATA:  Peritoneal dialysis catheter placement EXAM: ABDOMEN - 1 VIEW COMPARISON:  CT 03/06/2014 FINDINGS: Peritoneal dialysis catheter is noted in the left abdomen and coils in the midline of the pelvis. Nonobstructive bowel gas pattern. No free air organomegaly. Visualized lung bases clear. IMPRESSION: Peritoneal dialysis catheter coils in the midline of the pelvis. No acute findings. Electronically Signed   By: Charlett NoseKevin  Dover M.D.   On: 07/28/2016 11:14   Ct Angio Neck W Or Wo Contrast  Result Date: 07/27/2016 CLINICAL DATA:  Followup stroke. History of migraine, end-stage renal disease on dialysis, hypertension, migraines. EXAM: CT ANGIOGRAPHY HEAD AND NECK TECHNIQUE: Multidetector CT imaging of the head and neck was performed using the  standard protocol during bolus administration of intravenous contrast. Multiplanar CT image reconstructions and MIPs were obtained to evaluate the vascular anatomy. Carotid stenosis measurements (when applicable) are obtained utilizing NASCET criteria, using the distal internal carotid diameter as the denominator. CONTRAST:  50 cc Isovue 370 COMPARISON:  MRI and MRA of the head July 27, 2016 at 1716 hours FINDINGS: CT HEAD FINDINGS BRAIN: No intraparenchymal hemorrhage, mass effect nor midline shift. Patient's known acute to subacute infarcts not apparent by CT. The ventricles and sulci are normal. No acute large vascular territory infarcts. No abnormal extra-axial fluid collections. Basal cisterns are patent. VASCULAR: Unremarkable. SKULL/SOFT  TISSUES: No skull fracture. No significant soft tissue swelling. ORBITS/SINUSES: The included ocular globes and orbital contents are normal.Trace paranasal sinus mucosal thickening. Mastoid air cells are well aerated. OTHER: None. CTA NECK AORTIC ARCH: Normal appearance of the thoracic arch, 2 vessel arch is a normal variant. The origins of the innominate, left Common carotid artery and subclavian artery are widely patent. RIGHT CAROTID SYSTEM: Common carotid artery is widely patent, coursing in a straight line fashion. Normal appearance of the carotid bifurcation without hemodynamically significant stenosis by NASCET criteria. Tortuous, mildly ectatic internal carotid artery can be seen with chronic hypertension. LEFT CAROTID SYSTEM: Common carotid artery is widely patent, coursing in a straight line fashion. Normal appearance of the carotid bifurcation without hemodynamically significant stenosis by NASCET criteria. Tortuous, mildly ectatic internal carotid artery can be seen with chronic hypertension. VERTEBRAL ARTERIES:Codominant vertebral artery's. Normal appearance of the vertebral arteries, which appear widely patent. SKELETON: No acute osseous process though bone windows have not been submitted. OTHER NECK: Soft tissues of the neck are non-acute though, not tailored for evaluation. Dialysis catheter via RIGHT internal jugular venous approach, distal tip not imaged. CTA HEAD ANTERIOR CIRCULATION: Normal appearance of the cervical internal carotid arteries, petrous, cavernous and supra clinoid internal carotid arteries. Widely patent anterior communicating artery. Patent anterior and middle cerebral arteries. Mild luminal irregularity of the anterior cerebral arteries. No large vessel occlusion, hemodynamically significant stenosis, dissection, luminal irregularity, contrast extravasation or aneurysm. POSTERIOR CIRCULATION: Normal appearance of the vertebral arteries, vertebrobasilar junction and basilar  artery, as well as main branch vessels. Patent posterior cerebral arteries. Robust bilateral posterior communicating arteries present. Mild luminal irregularity of the posterior cerebral arteries. No large vessel occlusion, hemodynamically significant stenosis, dissection, luminal irregularity, contrast extravasation or aneurysm. VENOUS SINUSES: Major dural venous sinuses are patent though not tailored for evaluation on this angiographic examination. ANATOMIC VARIANTS: None. DELAYED PHASE: No abnormal intracranial enhancement. MIP images reviewed. IMPRESSION: CT HEAD:  Negative (patient's known infarcts not evident by CT). CTA NECK: No hemodynamically significant stenosis or acute vascular process. Mild changes of chronic hypertension. CTA HEAD: No emergent large vessel occlusion or severe stenosis. Mild intracranial atherosclerosis. Electronically Signed   By: Awilda Metro M.D.   On: 07/27/2016 22:12   Mr Maxine Glenn Head Wo Contrast  Result Date: 07/27/2016 CLINICAL DATA:  46 y/o  F; slurred speech. EXAM: MRI HEAD WITHOUT CONTRAST MRA HEAD WITHOUT CONTRAST TECHNIQUE: Multiplanar, multiecho pulse sequences of the brain and surrounding structures were obtained without intravenous contrast. Angiographic images of the head were obtained using MRA technique without contrast. COMPARISON:  07/27/2016 CT of the head. FINDINGS: MRI HEAD FINDINGS Brain: Small area of diffusion restriction in the right posterior frontal lobe, likely acute infarction. Left superior parietal small cortical focus of T2 FLAIR hyperintensity with mild decrease in ADC and small deep areas of restriction probably representing a early subacute  infarction. Punctate focus of diffusion restriction in the left superior cerebellar hemisphere, likely acute infarct. No abnormal susceptibility hypointensity to indicate intracranial hemorrhage. No hydrocephalus, extra-axial collection, or significant mass effect. Vascular: As below Skull and upper cervical  spine: Normal marrow signal. Sinuses/Orbits: Negative. Other: None. MRA HEAD FINDINGS Internal carotid arteries:  Patent. Anterior cerebral arteries:  Patent. Middle cerebral arteries: Patent. Anterior communicating artery: Patent. Posterior communicating arteries:  Patent. Posterior cerebral arteries:  Patent. Basilar artery:  Patent. Vertebral arteries:  Patent. No evidence of high-grade stenosis, large vessel occlusion, or aneurysm. IMPRESSION: 1. Small area of low diffusion consistent with acute infarction in right posterior frontal lobe. 2. Small left superior parietal cortical T2 FLAIR hyperintense focus with mildly diminished diffusion, distinct from the right frontal infarct, probably a subacute infarction. 3. Punctate left superior cerebellar, likely acute infarct. 4. No intracranial hemorrhage identified. 5. Normal MRA of the head. No evidence of high-grade stenosis, large vessel occlusion, or aneurysm. These results will be called to the ordering clinician or representative by the Radiologist Assistant, and communication documented in the PACS or zVision Dashboard. Electronically Signed   By: Mitzi Hansen M.D.   On: 07/27/2016 18:02   Mr Brain Wo Contrast  Result Date: 07/27/2016 CLINICAL DATA:  47 y/o  F; slurred speech. EXAM: MRI HEAD WITHOUT CONTRAST MRA HEAD WITHOUT CONTRAST TECHNIQUE: Multiplanar, multiecho pulse sequences of the brain and surrounding structures were obtained without intravenous contrast. Angiographic images of the head were obtained using MRA technique without contrast. COMPARISON:  07/27/2016 CT of the head. FINDINGS: MRI HEAD FINDINGS Brain: Small area of diffusion restriction in the right posterior frontal lobe, likely acute infarction. Left superior parietal small cortical focus of T2 FLAIR hyperintensity with mild decrease in ADC and small deep areas of restriction probably representing a early subacute infarction. Punctate focus of diffusion restriction in the  left superior cerebellar hemisphere, likely acute infarct. No abnormal susceptibility hypointensity to indicate intracranial hemorrhage. No hydrocephalus, extra-axial collection, or significant mass effect. Vascular: As below Skull and upper cervical spine: Normal marrow signal. Sinuses/Orbits: Negative. Other: None. MRA HEAD FINDINGS Internal carotid arteries:  Patent. Anterior cerebral arteries:  Patent. Middle cerebral arteries: Patent. Anterior communicating artery: Patent. Posterior communicating arteries:  Patent. Posterior cerebral arteries:  Patent. Basilar artery:  Patent. Vertebral arteries:  Patent. No evidence of high-grade stenosis, large vessel occlusion, or aneurysm. IMPRESSION: 1. Small area of low diffusion consistent with acute infarction in right posterior frontal lobe. 2. Small left superior parietal cortical T2 FLAIR hyperintense focus with mildly diminished diffusion, distinct from the right frontal infarct, probably a subacute infarction. 3. Punctate left superior cerebellar, likely acute infarct. 4. No intracranial hemorrhage identified. 5. Normal MRA of the head. No evidence of high-grade stenosis, large vessel occlusion, or aneurysm. These results will be called to the ordering clinician or representative by the Radiologist Assistant, and communication documented in the PACS or zVision Dashboard. Electronically Signed   By: Mitzi Hansen M.D.   On: 07/27/2016 18:02   Ct Head Code Stroke W/o Cm  Result Date: 07/27/2016 CLINICAL DATA:  Code stroke.  Intermittent slurred speech. EXAM: CT HEAD WITHOUT CONTRAST TECHNIQUE: Contiguous axial images were obtained from the base of the skull through the vertex without intravenous contrast. COMPARISON:  None. FINDINGS: Brain: No evidence of acute infarction, hemorrhage, hydrocephalus, extra-axial collection or mass lesion/mass effect. Vascular: No hyperdense vessel. Mild calcific atherosclerosis of paraclinoid internal carotid arteries.  Skull: Normal. Negative for fracture or focal lesion. Sinuses/Orbits: No acute  finding. Other: None. ASPECTS [facility] Stroke Program Early CT Score) - Ganglionic level infarction (caudate, lentiform nuclei, internal capsule, insula, M1-M3 cortex): 7 - Supraganglionic infarction (M4-M6 cortex): 3 Total score (0-10 with 10 being normal): 10 IMPRESSION: 1. No acute intracranial abnormality identified. Unremarkable CT of the head. 2. ASPECTS is 10 These results were called by telephone at the time of interpretation on 07/27/2016 at 2:31 pm to Dr. Elita Quick, who verbally acknowledged these results. Electronically Signed   By: Mitzi Hansen M.D.   On: 07/27/2016 14:32   . aspirin EC  81 mg Oral Daily  . dialysis solution 1.5% low-MG/low-CA  2,000 mL Intraperitoneal Q24H  . dialysis solution 1.5% low-MG/low-CA  2,250 mL Intraperitoneal Q24H  . gentamicin cream  1 application Topical Daily  . lactulose  20 g Oral Daily  . multivitamin  1 tablet Oral QHS  . pantoprazole  20 mg Oral Daily  . sevelamer carbonate  2,400 mg Oral TID WC    BMET    Component Value Date/Time   NA 131 (L) 07/28/2016 0138   NA 133 (L) 04/20/2015 1428   K 3.9 07/28/2016 0138   CL 92 (L) 07/28/2016 0138   CO2 24 07/28/2016 0138   GLUCOSE 93 07/28/2016 0138   BUN 58 (H) 07/28/2016 0138   BUN 68 (H) 04/20/2015 1428   CREATININE 17.14 (H) 07/28/2016 0138   CALCIUM 11.5 (H) 07/28/2016 0138   GFRNONAA 2 (L) 07/28/2016 0138   GFRAA 3 (L) 07/28/2016 0138   CBC    Component Value Date/Time   WBC 7.3 07/28/2016 0741   RBC 3.25 (L) 07/28/2016 0741   HGB 11.2 (L) 07/28/2016 0741   HCT 33.2 (L) 07/28/2016 0741   HCT 31.8 (L) 08/04/2015 1629   PLT 73 (L) 07/28/2016 0741   PLT 291 08/04/2015 1629   MCV 102.2 (H) 07/28/2016 0741   MCV 99 (H) 08/04/2015 1629   MCH 34.5 (H) 07/28/2016 0741   MCHC 33.7 07/28/2016 0741   RDW 17.5 (H) 07/28/2016 0741   RDW 13.7 08/04/2015 1629   LYMPHSABS 2.7 07/27/2016 1450   LYMPHSABS  0.8 08/04/2015 1629   MONOABS 0.7 07/27/2016 1450   EOSABS 0.1 07/27/2016 1450   EOSABS 0.4 08/04/2015 1629   BASOSABS 0.0 07/27/2016 1450   BASOSABS 0.0 08/04/2015 1629     CCPD: 6 exchanges, 2.25 liters fill volume, dwell time 1.5 hrs, fill time 20 min, drain time 30 min. Now will change to 2.5 liters fill volume at night.   Assessment/ Plan:   1. Embolic TIA/strokes- Work up reveals large patent foramen ovale, no DVT.  s/p loop recorder placement 07/28/16.  Given PFO, may want to remove HD catheter as a source of emboli, however this can be done as an outpatient unless cardiology feels this is source of emboli.  Neuro has signed off and recommended outpatient TEE and referral to Dr. Excell Seltzer for PFO closure and feels patient is stable for discharge. 2. ESRD: KUB with PD catheter in adequate position and did better with CCPD.  She also tolerated the larger fill volumes of 2.5 liters during the evening but had small UF.  Instructed pt to use 1 bag of 2.5% Dianal solution tonight (if she goes home) and f/u with Dr. Darrick Penna as an outpatient.  Continue with miralax due to constipation  3. Anemia: micera every 2 weeks (last given 07/17/16) 4. CKD-MBD: elevated calcium so calcitriol and phoslo on hold (unfortunately still waiting on labs from this morning).  Continue with renvela and would add fosrenol 1 gram qac (since phos was 7.7) and continue to hold calcium acetate and calcitriol.  Recheck labs as an outpatient. 5. Nutrition: renal diet 6. Hypertension: stable 7. Disposition- should be stable for discharge today pending labs  Irena Cords, MD [employer] [phone]

## 2016-07-29 NOTE — Discharge Instructions (Signed)
Ischemic Stroke °An ischemic stroke (cerebrovascular accident, or CVA) is the sudden death of brain tissue that occurs when an area of the brain does not get enough oxygen. It is a medical emergency that must be treated right away. An ischemic stroke can cause permanent loss of brain function. This can cause problems with how different parts of your body function. °What are the causes? °This condition is caused by a decrease of oxygen supply to an area of the brain, which may be the result of: °· A small blood clot (embolus) or a buildup of plaque in the blood vessels (atherosclerosis) that blocks blood flow in the brain. °· An abnormal heart rhythm (atrial fibrillation). °· A blocked or damaged artery in the head or neck. °What increases the risk? °Certain factors may make you more likely to develop this condition. Some of these factors are things that you can change, such as: °· Obesity. °· Smoking cigarettes. °· Taking oral birth control, especially if you also use tobacco. °· Physical inactivity. °· Excessive alcohol use. °· Use of illegal drugs, especially cocaine and methamphetamine. °Other risk factors include: °· High blood pressure (hypertension). °· High cholesterol. °· Diabetes mellitus. °· Heart disease. °· Being African American, Native American, Hispanic, or [place] Native. °· Being over age 60. °· Family history of stroke. °· Previous history of blood clots, stroke, or transient ischemic attack (TIA). °· Sickle cell disease. °· Being a woman with a history of preeclampsia. °· Migraine headache. °· Sleep apnea. °· Irregular heartbeats, such as atrial fibrillation. °· Chronic inflammatory diseases, such as rheumatoid arthritis or lupus. °· Blood clotting disorders (hypercoagulable state). °What are the signs or symptoms? °Symptoms of this condition usually develop suddenly, or you may notice them after waking up from sleep. Symptoms may include sudden: °· Weakness or numbness in your face, arm, or leg,  especially on one side of your body. °· Trouble walking or difficulty moving your arms or legs. °· Loss of balance or coordination. °· Confusion. °· Slurred speech (dysarthria). °· Trouble speaking, understanding speech, or both (aphasia). °· Vision changes--such as double vision, blurred vision, or loss of vision--in one or both eyes. °· Dizziness. °· Nausea and vomiting. °· Severe headache with no known cause. The headache is often described as the worst headache ever experienced. °If possible, make note of the exact time that you last felt like your normal self and what time your symptoms started. Tell your health care provider. °If symptoms come and go, this could be a sign of a warning stroke, or TIA. Get help right away, even if you feel better. °How is this diagnosed? °This condition may be diagnosed based on: °· Your symptoms, your medical history, and a physical exam. °· CT scan of the brain. °· MRI. °· CT angiogram. This test uses a computer to take X-rays of your arteries. A dye may be injected into your blood to show the inside of your blood vessels more clearly. °· MRI angiogram. This is a type of MRI that is used to evaluate the blood vessels. °· Cerebral angiogram. This test uses X-rays and a dye to show the blood vessels in the brain and neck. °You may need to see a health care provider who specializes in stroke care. A stroke specialist can be seen in person or through communication using telephone or television technology (telemedicine). °Other tests may also be done to find the cause of the stroke, such as: °· Electrocardiogram (ECG). °· Continuous heart monitoring. °· Echocardiogram. °·   Carotid ultrasound. °· A scan of the brain circulation. °· Blood tests. °· Sleep study to check for sleep apnea. °How is this treated? °Treatment for this condition will depend on the duration, severity, and cause of your symptoms and on the area of the brain affected. It is very important to get treatment at the  first sign of stroke symptoms. Some treatments work better if they are done within 3-6 hours of the onset of stroke symptoms. These initial treatments may include: °· Aspirin. °· Medicines to control blood pressure. °· Medicine given by injection to dissolve the blood clot (thrombolytic). °· Treatments given directly to the affected artery to remove or dissolve the blood clot. °Other treatment options may include: °· Oxygen. °· IV fluids. °· Medicines to thin the blood (anticoagulants or antiplatelets). °· Procedures to increase blood flow. °Medicines and changes to your diet may be used to help treat and manage risk factors for stroke, such as diabetes, high cholesterol, and high blood pressure. °After a stroke, you may work with physical, speech, mental health, or occupational therapists to help you recover. °Follow these instructions at home: °Medicines  °· Take over-the-counter and prescription medicines only as told by your health care provider. °· If you were told to take a medicine to thin your blood, such as aspirin or an anticoagulant, take it exactly as told by your health care provider. °¨ Taking too much blood-thinning medicine can cause bleeding. °¨ If you do not take enough blood-thinning medicine, you will not have the protection that you need against another stroke and other problems. °· Understand the side effects of taking anticoagulant medicine. When taking this type of medicine, make sure you: °¨ Hold pressure over any cuts for longer than usual. °¨ Tell your dentist and other health care providers that you are taking anticoagulants before you have any procedures that may cause bleeding. °¨ Avoid activities that may cause trauma or injury. °Eating and drinking  °· Follow instructions from your health care provider about diet. °· Eat healthy foods. °· If your ability to swallow was affected by the stroke, you may need to take steps to avoid choking, such as: °¨ Taking small bites when  eating. °¨ Eating foods that are soft or pureed. °Safety  °· Follow instructions from your health care team about physical activity. °· Use a walker or cane as told by your health care provider. °· Take steps to create a safe home environment in order to reduce the risk of falls. This may include: °¨ Having your home looked at by specialists. °¨ Installing grab bars in the bedroom and bathroom. °¨ Using safety equipment, such as raised toilets and a seat in the shower. °General instructions  °· Do not use any tobacco products, such as cigarettes, chewing tobacco, and e-cigarettes. If you need help quitting, ask your health care provider. °· Limit alcohol intake to no more than 1 drink a day for nonpregnant women and 2 drinks a day for men. One drink equals 12 oz of beer, 5 oz of wine, or 1½ oz of hard liquor. °· If you need help to stop using drugs or alcohol, ask your health care provider about a referral to a program or specialist. °· Maintain an active and healthy lifestyle. Get regular exercise as told by your health care provider. °· Keep all follow-up visits as told by your health care provider, including visits with all specialists on your health care team. This is important. °How is this prevented? °Your   risk of another stroke can be decreased by managing high blood pressure, high cholesterol, diabetes, heart disease, sleep apnea, and obesity. It can also be decreased by quitting smoking, limiting alcohol, and staying physically active. °Your health care provider will continue to work with you on measures to prevent short-term and long-term complications of stroke. °Get help right away if: °You have: °· Sudden weakness or numbness in your face, arm, or leg, especially on one side of your body. °· Sudden confusion. °· Sudden trouble speaking, understanding, or both (aphasia). °· Sudden trouble seeing with one or both eyes. °· Sudden trouble walking or difficulty moving your arms or legs. °· Sudden  dizziness. °· Sudden loss of balance or coordination. °· Sudden, severe headache with no known cause. °· A partial or total loss of consciousness. °· A seizure. °Any of these symptoms may represent a serious problem that is an emergency. Do not wait to see if the symptoms will go away. Get medical help right away. Call your local emergency services (911 in [place] Do not drive yourself to the hospital. °This information is not intended to replace advice given to you by your health care provider. Make sure you discuss any questions you have with your health care provider. °Document Released: 05/01/2005 Document Revised: 10/12/2015 Document Reviewed: 07/28/2015 °Elsevier Interactive Patient Education © 2017 [employer] ° °

## 2016-07-29 NOTE — Progress Notes (Addendum)
Subjective: Patient reports feeling well and has no complaints. Denies any new neurologic symptoms or discomfort. Tolerated peritoneal dialysis well. States she is tired of being in the hospital and wants to go home very soon.   Objective: Vital signs in last 24 hours: Vitals:   07/28/16 2113 07/29/16 0119 07/29/16 0548 07/29/16 0844  BP: 102/67 (!) 140/96 (!) 125/99 104/70  Pulse: (!) 106 (!) 107 (!) 103 (!) 110  Resp: 20 18 18 18   Temp: 98.7 F (37.1 C) 98.8 F (37.1 C) 98.6 F (37 C) 98.5 F (36.9 C)  TempSrc: Oral Oral Oral Oral  SpO2: 100% 98% 99% 98%  Weight:   188 lb 15 oz (85.7 kg)   Height:        Intake/Output Summary (Last 24 hours) at 07/29/16 0929 Last data filed at 07/29/16 0831  Gross per 24 hour  Intake                0 ml  Output              577 ml  Net             -577 ml    Physical Exam General appearance: Resting comfortably in bed, in no distress, conversational Cardiovascular: Regular rate and rhythm, no murmurs, rubs, gallops Respiratory/Chest: Clear to ausculation bilaterally, normal work of breathing Abdomen: Soft, non-tender, non-distended Skin: Warm, dry, intact Neuro: Cranial nerves grossly intact, alert and oriented, strength 5/5, sensation to light touch intact, and coordination intact  Labs / Imaging / Procedures: CBC Latest Ref Rng & Units 07/28/2016 07/27/2016 07/27/2016  WBC 4.0 - 10.5 K/uL 7.3 - 8.0  Hemoglobin 12.0 - 15.0 g/dL 11.2(L) 11.2(L) 11.1(L)  Hematocrit 36.0 - 46.0 % 33.2(L) 33.0(L) 32.9(L)  Platelets 150 - 400 K/uL 73(L) - 63(L)   BMP Latest Ref Rng & Units 07/28/2016 07/27/2016 07/27/2016  Glucose 65 - 99 mg/dL 93 161(W) 960(A)  BUN 6 - 20 mg/dL 54(U) 98(J) 19(J)  Creatinine 0.44 - 1.00 mg/dL 47.82(N) >56.21(H) 08.65(H)  BUN/Creat Ratio 9 - 23 - - -  Sodium 135 - 145 mmol/L 131(L) 131(L) 132(L)  Potassium 3.5 - 5.1 mmol/L 3.9 3.9 3.8  Chloride 101 - 111 mmol/L 92(L) 97(L) 94(L)  CO2 22 - 32 mmol/L 24 - 24  Calcium 8.9  - 10.3 mg/dL 11.5(H) - 12.3(H)   CTA head and neck - no significant stenosis or acute vascular changes, no large vessel occlusion  Assessment/Plan: Elizabeth Anthony is a 46 y.o. woman with PMH ESRD, obesity, OSA, HTN admitted for acute stroke.   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 Presented neurologically intact on 3/15 following transient aphasic episode. MRI revealed multifocal infarcts, some subacute, likely cardioembolic until proven otherwise. Transcranial doppler done 3/16 showing a large PFO. LE dopplers done 3/16 negative for DVT. CTA and MRA are negative for any significant stenosis. Patient was seen by cardiology and loop recorder implanted on 3/16. She continues to be neurologically intact. The plan is for Elizabeth Anthony to f/u with cardiology on an outpatient basis for a TEE and evaluation for PFO closure. She is clear for discharge from neurology standpoint. Patient will need to f/u with  neurology as outpatient in 4 weeks.  - Appreciate neurology help - Aspirin 81 mg daily  - No statin indicated per neuro; LDL normal  - Hypercoag panel pending  ESRD on PD, with elevated BUN and creatinine. Elizabeth Anthony last documented BUN/creatinine apprxo 50 and 11.4 in 04/2016. On admit creatinine elevated > 18 and BUN 54-58. Averse to HD due to busy schedule. She has been very compliant with Elizabeth Anthony PD. She has been tolerating PD well as inpatient. Plan is for Elizabeth Anthony to continue PD at home tonight. Corrected calcium was 12.5 and phosphorous 7.7 yesterday. Today's labs still pending. -- Appreciate nephrology recs -- hold calcium supplement and phoslo  -- Continue renvela -- Fosrenol  -- Renal function panel pending  -- Continue  peritoneal dialysis as outpatient  -- Follow up with Dr. Darrick Pennaeterding outpatient, has appointment Monday -- Will need removal of HD catheter as outpatient (possible source of emboli) -- Will need repeat labs as outpatient   Elevated troponin. Trended 0.3>0.29>0.28. No chest pain, no EKG changes. - Secondary to ESRD and decreased clearance  Hypertension, normotensive this admission. Takes metoprolol 25 mg daily at home for unspecified tachycardia per patient. Denies history of arrhythmia  - hold metoprolol for permissive hypertension  FEN/GI: Renal diet, replete electrolytes as needed  Dispo: Anticipated discharge today.     LOS: 1 day   John GiovanniVasundhra Gaige Sebo, MD 07/29/2016, 9:29 AM Pager: [phone]

## 2016-07-29 NOTE — Progress Notes (Signed)
Pt discharging at this time taking all personal belongings. IV discontinued, dry dressing applied. Discharge instructions provided with verbal understanding. Pt will follow up with Md per summary. No noted distress.

## 2016-07-30 LAB — CARDIOLIPIN ANTIBODIES, IGG, IGM, IGA
Anticardiolipin IgA: <9 APL U/mL (ref 0–11)
Anticardiolipin IgM: <9 MPL U/mL (ref 0–12)

## 2016-07-30 LAB — LUPUS ANTICOAGULANT PANEL
DRVVT: 44.9 s (ref 0.0–47.0)
PTT Lupus Anticoagulant: 25.7 s (ref 0.0–51.9)

## 2016-07-30 NOTE — Discharge Summary (Signed)
Name: Elizabeth Anthony MRN: [id number] DOB: 1970-10-07 46 y.o. PCP: Maple Hudson., MD  Date of Admission: 07/27/2016  2:09 PM Date of Discharge: 07/30/2016 Attending Physician: Earl Lagos, MD  Discharge Diagnosis: 1. Stroke 2. Patent foramen ovale 3. ESRD on peritoneal dialysis 4. Hypercalcemia 5. 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)   ESRD on peritoneal dialysis (HCC)   Hypercalcemia   Episodes of speech arrest   TIA (transient ischemic attack)   Thrombocytopenia (HCC)   History of anemia due to CKD   PD catheter dysfunction (HCC)   PFO (patent foramen ovale)   Discharge Medications: Allergies as of 07/29/2016      Reactions   Ace Inhibitors Other (See Comments)   Uncontrollable Coughing & Choking   Dye Fdc Red [red Dye] Other (See Comments)   Kidney failure   Nsaids Other (See Comments)   Shoots creatine up   Vancomycin Swelling, Rash, Other (See Comments)   Entire skin peeled, sores in throat   Alcohol-sulfur [sulfur] Other (See Comments)   Itching, numbness of mouth   Alcohol Itching, Other (See Comments)   Hard liquor (Vodka)      Medication List    STOP taking these medications   calcitRIOL 0.5 MCG capsule Commonly known as:  ROCALTROL   calcium acetate 667 MG capsule Commonly known as:  PHOSLO   meloxicam 7.5 MG tablet Commonly known as:  MOBIC   metoprolol succinate 50 MG 24 hr tablet Commonly known as:  TOPROL-XL   predniSONE 20 MG tablet Commonly known as:  DELTASONE   traMADol 50 MG tablet Commonly known as:  ULTRAM     TAKE these medications   ALPRAZolam 0.5 MG tablet Commonly known as:  XANAX Take 1 tablet (0.5 mg total) by mouth 2 (two) times daily as needed for anxiety.   aspirin 81 MG EC tablet Take 1 tablet (81 mg total) by mouth daily.   cetirizine 10 MG tablet Commonly known as:  ZYRTEC Take 1  tablet (10 mg total) by mouth daily.   lanthanum 1000 MG chewable tablet Commonly known as:  FOSRENOL Chew 1 tablet (1,000 mg total) by mouth 3 (three) times daily with meals.   multivitamin Tabs tablet Take 1 tablet by mouth at bedtime.   oxyCODONE-acetaminophen 5-325 MG tablet Commonly known as:  ROXICET Take 1 tablet by mouth every 6 (six) hours as needed.   sevelamer carbonate 800 MG tablet Commonly known as:  RENVELA Take 800-2,400 mg by mouth See admin instructions. Take 2400 mg by mouth 3 times daily with meals. Take 800 mg by mouth with snacks.       Disposition and follow-up:   Ms.Mayerli Acklin was discharged from [facility] in Stable condition.  At the hospital follow up visit please address:  1.  Stroke - assess for recurrence of any neurologic deficits, dysarthria, etc, and adherence with daily Aspirin  PFO - follow up with cardiology for TEE and evaluation for PFO closure  ESRD on PD - assess peritoneal dialysis schedule and equipment, presented with profoundly elevated Cr and BUN despite adherence with PD at home, also should have her hemodialysis catheter removed as this is a potential source of clot or bloodstream infection,   Hypercalcemia - calcitriol/ca acetate held on discharge due to hypercalcemia, assess need to restart  Thrombocytopenia - assess platelet count and any signs of bleeding, hypercoag workup  was negative but has history of unexplained FSGS and ANA+, may benefit from further evaluation for underlying rheumatologic condition  2.  Labs / imaging needed at time of follow-up: CBC, renal function panel  3.  Pending labs/ test needing follow-up: None  Follow-up Appointments: Follow-up Information    [name],[name], MD. Schedule an appointment as soon as possible for a visit in 6 week(s).   Specialty:  Neurology Contact information: [address] [place] [address] [phone]        [name], [name], MD.  Schedule an appointment as soon as possible for a visit in 4 day(s).   Specialty:  Cardiology Contact information: [address] [place] [address] [phone]        [name], MD Follow up.   Specialty:  Family Medicine Why:  Please call and make an appointment for a follow-up visit in 5-7 days.  Contact information: [address] [place] [address] [phone]        [name],[name], MD. Nyra CapesGo on 07/31/2016.   Specialty:  Nephrology Why:  Appointment at 4 pm.  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  Stroke Ms. Elizabeth Anthony is a 46 year old female with a past medical history of hypertension, OSA on CPAP, ESRD on peritoneal dialysis who presented to the ED on 3/15 as a code stroke with acute word finding difficulties that lasted approximately 20-30 minutes. She had also experienced a transient episode of right arm heaviness the week prior. She did not receive tPA due to mild/resolved symptoms. CT head was negative but MRI revealed acute right posterior frontal lobe infarct, small left cerebellar infarct, and subacute left superior parietal cortical infarct concerning for cardioembolic strokes. She was started on daily Aspirin. Extensive neurologic workup ensued including MRA (normal), CTA head and neck (normal), lower extremity dopplers (negative), TTE (normal), lipid panel (LDL 40), A1c (4.9), negative hypercoagulable workup. Telemetry revealed no documented arrhythmias during her hospital  stay. Transcranial doppler bubble study results were concerning for a large PFO as a source for her cryptogenic cardioembolic strokes. Additionally a loop recorder was implanted by electrophysiology to monitor for paroxysmal atrial fibrillation. Ms. Elizabeth Anthony remained asymptomatic without recurrence of neurologic symptoms throughout her hospitalization and was deemed stable for discharge on 3/17 with PCP, cardiology, and neurology follow up.  2. PFO - workup for her cryptogenic embolic strokes included a transcranial doppler bubble study on 3/16 and results were consistent with a large PFO. Lower extremity dopplers were negative for DVT. Neurology recommended outpatient transesophageal echocardiogram and referral to Dr. Excell Seltzerooper to consider PFO closure in the future.   3. ESRD on PD - presented with significantly elevated creatinine to 17 and BUN 53 from prior (Cr 9-11.4, BUN 32-50), has been very adherent with peritoneal dialysis nightly since Jan 2018, transitioned from hemodialysis due to her busy schedule. She had recently had an adequate clearance study in 06/2016 with KT/V of 2.25. Some concern for malfunctioning PD machine at home. Nephrology  consulted and her peritoneal dialysis was continued during her hospital stay.  4. 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 Calcitriol and Ca Acetate were held during hospitalization and on discharge, calcium had decreased to 10.4 by day of discharge. Renvela was continued and Fosrenol added to her regimen.  5. Thrombocytopenia - presented with platelets in 60k range from previous baseline thrombocytopenia in 130-140k range, unclear etiology. Her platelet count was monitored on a daily basis and improved to 73 and then 78k by discharge.   Discharge Vitals:   BP 104/78 (BP Location: Right Arm)   Pulse 98    Temp 98.2 F (36.8 C) (Oral)   Resp 18   Ht 5\' 3"  (1.6 m)   Wt 188 lb 15 oz (85.7 kg)   LMP 06/18/2016   SpO2 99%   BMI 33.47 kg/m   Pertinent Labs, Studies, and Procedures:  CBC Latest Ref Rng & Units 07/29/2016 07/28/2016 07/27/2016  WBC 4.0 - 10.5 K/uL 6.2 7.3 -  Hemoglobin 12.0 - 15.0 g/dL 10.7(L) 11.2(L) 11.2(L)  Hematocrit 36.0 - 46.0 % 31.4(L) 33.2(L) 33.0(L)  Platelets 150 - 400 K/uL 78(L) 73(L) -   BMP Latest Ref Rng & Units 07/29/2016 07/28/2016 07/27/2016  Glucose 65 - 99 mg/dL 098(J) 93 191(Y)  BUN 6 - 20 mg/dL 78(G) 95(A) 21(H)  Creatinine 0.44 - 1.00 mg/dL 08.65(H) 84.69(G) >29.52(W)  BUN/Creat Ratio 9 - 23 - - -  Sodium 135 - 145 mmol/L 132(L) 131(L) 131(L)  Potassium 3.5 - 5.1 mmol/L 3.5 3.9 3.9  Chloride 101 - 111 mmol/L 94(L) 92(L) 97(L)  CO2 22 - 32 mmol/L 24 24 -  Calcium 8.9 - 10.3 mg/dL 10.4(H) 11.5(H) -   Hypercoagulable workup negative: Homocysteine mild elevation - 32 (nml 0-15), nonspecific in known cva Anticardiolipin Ab - negative Lupus anticoag - negative Beta 2 glycoprotein I Ab - negative Antithrombin III - normal Cardiolipin Ab - negative Prothrombin mutation negative Factor V Leiden - negative Protein C/S level and activity - normal HIV Ab non reactive  HbA1c 4.9 TSH 2.5  Lipid Panel     Component Value Date/Time   CHOL 124 07/27/2016 1950   CHOL 185 03/29/2015 0903   TRIG 249 (H) 07/27/2016 1950   HDL 34 (L) 07/27/2016 1950   HDL 46 03/29/2015 0903   CHOLHDL 3.6 07/27/2016 1950   VLDL 50 (H) 07/27/2016 1950   LDLCALC 40 07/27/2016 1950   LDLCALC 111 (H) 03/29/2015 0903   3/16 Loop recorder insertion - insertion of [employer] Reveal LINQ via Dr. Johney Frame  3/16 VAS [place] Transcranial doppler with bubble - high intensity transient signals were heard at rest and with valsalva, suggestive of a large patent foramen ovale (PFO)  3/16 VAS US doppler bilateral lower extremity - negative for DVT  3/16 TTE LVEF 65-70%, mild LVH, no WMA, grade 1  diastolic dysfunction, no signif valve abnormalities  Ct Angio Head W Or Wo Contrast  Result Date: 07/27/2016 CLINICAL DATA:  Followup stroke. History of migraine, end-stage renal disease on dialysis, hypertension, migraines. EXAM: CT ANGIOGRAPHY HEAD AND NECK TECHNIQUE: Multidetector CT imaging of the head and neck was performed using the standard protocol during bolus administration of intravenous contrast. Multiplanar CT image reconstructions and MIPs were obtained to evaluate the vascular anatomy. Carotid stenosis measurements (when applicable) are obtained utilizing NASCET criteria, using the distal internal carotid diameter as the  denominator. CONTRAST:  50 cc Isovue 370 COMPARISON:  MRI and MRA of the head July 27, 2016 at 1716 hours FINDINGS: CT HEAD FINDINGS BRAIN: No intraparenchymal hemorrhage, mass effect nor midline shift. Patient's known acute to subacute infarcts not apparent by CT. The ventricles and sulci are normal. No acute large vascular territory infarcts. No abnormal extra-axial fluid collections. Basal cisterns are patent. VASCULAR: Unremarkable. SKULL/SOFT TISSUES: No skull fracture. No significant soft tissue swelling. ORBITS/SINUSES: The included ocular globes and orbital contents are normal.Trace paranasal sinus mucosal thickening. Mastoid air cells are well aerated. OTHER: None. CTA NECK AORTIC ARCH: Normal appearance of the thoracic arch, 2 vessel arch is a normal variant. The origins of the innominate, left Common carotid artery and subclavian artery are widely patent. RIGHT CAROTID SYSTEM: Common carotid artery is widely patent, coursing in a straight line fashion. Normal appearance of the carotid bifurcation without hemodynamically significant stenosis by NASCET criteria. Tortuous, mildly ectatic internal carotid artery can be seen with chronic hypertension. LEFT CAROTID SYSTEM: Common carotid artery is widely patent, coursing in a straight line fashion. Normal appearance of the  carotid bifurcation without hemodynamically significant stenosis by NASCET criteria. Tortuous, mildly ectatic internal carotid artery can be seen with chronic hypertension. VERTEBRAL ARTERIES:Codominant vertebral artery's. Normal appearance of the vertebral arteries, which appear widely patent. SKELETON: No acute osseous process though bone windows have not been submitted. OTHER NECK: Soft tissues of the neck are non-acute though, not tailored for evaluation. Dialysis catheter via RIGHT internal jugular venous approach, distal tip not imaged. CTA HEAD ANTERIOR CIRCULATION: Normal appearance of the cervical internal carotid arteries, petrous, cavernous and supra clinoid internal carotid arteries. Widely patent anterior communicating artery. Patent anterior and middle cerebral arteries. Mild luminal irregularity of the anterior cerebral arteries. No large vessel occlusion, hemodynamically significant stenosis, dissection, luminal irregularity, contrast extravasation or aneurysm. POSTERIOR CIRCULATION: Normal appearance of the vertebral arteries, vertebrobasilar junction and basilar artery, as well as main branch vessels. Patent posterior cerebral arteries. Robust bilateral posterior communicating arteries present. Mild luminal irregularity of the posterior cerebral arteries. No large vessel occlusion, hemodynamically significant stenosis, dissection, luminal irregularity, contrast extravasation or aneurysm. VENOUS SINUSES: Major dural venous sinuses are patent though not tailored for evaluation on this angiographic examination. ANATOMIC VARIANTS: None. DELAYED PHASE: No abnormal intracranial enhancement. MIP images reviewed. IMPRESSION: CT HEAD:  Negative (patient's known infarcts not evident by CT). CTA NECK: No hemodynamically significant stenosis or acute vascular process. Mild changes of chronic hypertension. CTA HEAD: No emergent large vessel occlusion or severe stenosis. Mild intracranial atherosclerosis.  Electronically Signed   By: Awilda Metro M.D.   On: 07/27/2016 22:12   Dg Abd 1 View  Result Date: 07/28/2016 CLINICAL DATA:  Peritoneal dialysis catheter placement EXAM: ABDOMEN - 1 VIEW COMPARISON:  CT 03/06/2014 FINDINGS: Peritoneal dialysis catheter is noted in the left abdomen and coils in the midline of the pelvis. Nonobstructive bowel gas pattern. No free air organomegaly. Visualized lung bases clear. IMPRESSION: Peritoneal dialysis catheter coils in the midline of the pelvis. No acute findings. Electronically Signed   By: Charlett Nose M.D.   On: 07/28/2016 11:14   Ct Angio Neck W Or Wo Contrast  Result Date: 07/27/2016 CLINICAL DATA:  Followup stroke. History of migraine, end-stage renal disease on dialysis, hypertension, migraines. EXAM: CT ANGIOGRAPHY HEAD AND NECK TECHNIQUE: Multidetector CT imaging of the head and neck was performed using the standard protocol during bolus administration of intravenous contrast. Multiplanar CT image reconstructions and MIPs were obtained to  evaluate the vascular anatomy. Carotid stenosis measurements (when applicable) are obtained utilizing NASCET criteria, using the distal internal carotid diameter as the denominator. CONTRAST:  50 cc Isovue 370 COMPARISON:  MRI and MRA of the head July 27, 2016 at 1716 hours FINDINGS: CT HEAD FINDINGS BRAIN: No intraparenchymal hemorrhage, mass effect nor midline shift. Patient's known acute to subacute infarcts not apparent by CT. The ventricles and sulci are normal. No acute large vascular territory infarcts. No abnormal extra-axial fluid collections. Basal cisterns are patent. VASCULAR: Unremarkable. SKULL/SOFT TISSUES: No skull fracture. No significant soft tissue swelling. ORBITS/SINUSES: The included ocular globes and orbital contents are normal.Trace paranasal sinus mucosal thickening. Mastoid air cells are well aerated. OTHER: None. CTA NECK AORTIC ARCH: Normal appearance of the thoracic arch, 2 vessel arch is a  normal variant. The origins of the innominate, left Common carotid artery and subclavian artery are widely patent. RIGHT CAROTID SYSTEM: Common carotid artery is widely patent, coursing in a straight line fashion. Normal appearance of the carotid bifurcation without hemodynamically significant stenosis by NASCET criteria. Tortuous, mildly ectatic internal carotid artery can be seen with chronic hypertension. LEFT CAROTID SYSTEM: Common carotid artery is widely patent, coursing in a straight line fashion. Normal appearance of the carotid bifurcation without hemodynamically significant stenosis by NASCET criteria. Tortuous, mildly ectatic internal carotid artery can be seen with chronic hypertension. VERTEBRAL ARTERIES:Codominant vertebral artery's. Normal appearance of the vertebral arteries, which appear widely patent. SKELETON: No acute osseous process though bone windows have not been submitted. OTHER NECK: Soft tissues of the neck are non-acute though, not tailored for evaluation. Dialysis catheter via RIGHT internal jugular venous approach, distal tip not imaged. CTA HEAD ANTERIOR CIRCULATION: Normal appearance of the cervical internal carotid arteries, petrous, cavernous and supra clinoid internal carotid arteries. Widely patent anterior communicating artery. Patent anterior and middle cerebral arteries. Mild luminal irregularity of the anterior cerebral arteries. No large vessel occlusion, hemodynamically significant stenosis, dissection, luminal irregularity, contrast extravasation or aneurysm. POSTERIOR CIRCULATION: Normal appearance of the vertebral arteries, vertebrobasilar junction and basilar artery, as well as main branch vessels. Patent posterior cerebral arteries. Robust bilateral posterior communicating arteries present. Mild luminal irregularity of the posterior cerebral arteries. No large vessel occlusion, hemodynamically significant stenosis, dissection, luminal irregularity, contrast  extravasation or aneurysm. VENOUS SINUSES: Major dural venous sinuses are patent though not tailored for evaluation on this angiographic examination. ANATOMIC VARIANTS: None. DELAYED PHASE: No abnormal intracranial enhancement. MIP images reviewed. IMPRESSION: CT HEAD:  Negative (patient's known infarcts not evident by CT). CTA NECK: No hemodynamically significant stenosis or acute vascular process. Mild changes of chronic hypertension. CTA HEAD: No emergent large vessel occlusion or severe stenosis. Mild intracranial atherosclerosis. Electronically Signed   By: Awilda Metro M.D.   On: 07/27/2016 22:12   Mr Maxine Glenn Head Wo Contrast  Result Date: 07/27/2016 CLINICAL DATA:  46 y/o  F; slurred speech. EXAM: MRI HEAD WITHOUT CONTRAST MRA HEAD WITHOUT CONTRAST TECHNIQUE: Multiplanar, multiecho pulse sequences of the brain and surrounding structures were obtained without intravenous contrast. Angiographic images of the head were obtained using MRA technique without contrast. COMPARISON:  07/27/2016 CT of the head. FINDINGS: MRI HEAD FINDINGS Brain: Small area of diffusion restriction in the right posterior frontal lobe, likely acute infarction. Left superior parietal small cortical focus of T2 FLAIR hyperintensity with mild decrease in ADC and small deep areas of restriction probably representing a early subacute infarction. Punctate focus of diffusion restriction in the left superior cerebellar hemisphere, likely acute infarct. No abnormal  susceptibility hypointensity to indicate intracranial hemorrhage. No hydrocephalus, extra-axial collection, or significant mass effect. Vascular: As below Skull and upper cervical spine: Normal marrow signal. Sinuses/Orbits: Negative. Other: None. MRA HEAD FINDINGS Internal carotid arteries:  Patent. Anterior cerebral arteries:  Patent. Middle cerebral arteries: Patent. Anterior communicating artery: Patent. Posterior communicating arteries:  Patent. Posterior cerebral arteries:   Patent. Basilar artery:  Patent. Vertebral arteries:  Patent. No evidence of high-grade stenosis, large vessel occlusion, or aneurysm. IMPRESSION: 1. Small area of low diffusion consistent with acute infarction in right posterior frontal lobe. 2. Small left superior parietal cortical T2 FLAIR hyperintense focus with mildly diminished diffusion, distinct from the right frontal infarct, probably a subacute infarction. 3. Punctate left superior cerebellar, likely acute infarct. 4. No intracranial hemorrhage identified. 5. Normal MRA of the head. No evidence of high-grade stenosis, large vessel occlusion, or aneurysm. These results will be called to the ordering clinician or representative by the Radiologist Assistant, and communication documented in the PACS or zVision Dashboard. Electronically Signed   By: Mitzi Hansen M.D.   On: 07/27/2016 18:02   Mr Brain Wo Contrast  Result Date: 07/27/2016 CLINICAL DATA:  46 y/o  F; slurred speech. EXAM: MRI HEAD WITHOUT CONTRAST MRA HEAD WITHOUT CONTRAST TECHNIQUE: Multiplanar, multiecho pulse sequences of the brain and surrounding structures were obtained without intravenous contrast. Angiographic images of the head were obtained using MRA technique without contrast. COMPARISON:  07/27/2016 CT of the head. FINDINGS: MRI HEAD FINDINGS Brain: Small area of diffusion restriction in the right posterior frontal lobe, likely acute infarction. Left superior parietal small cortical focus of T2 FLAIR hyperintensity with mild decrease in ADC and small deep areas of restriction probably representing a early subacute infarction. Punctate focus of diffusion restriction in the left superior cerebellar hemisphere, likely acute infarct. No abnormal susceptibility hypointensity to indicate intracranial hemorrhage. No hydrocephalus, extra-axial collection, or significant mass effect. Vascular: As below Skull and upper cervical spine: Normal marrow signal. Sinuses/Orbits: Negative.  Other: None. MRA HEAD FINDINGS Internal carotid arteries:  Patent. Anterior cerebral arteries:  Patent. Middle cerebral arteries: Patent. Anterior communicating artery: Patent. Posterior communicating arteries:  Patent. Posterior cerebral arteries:  Patent. Basilar artery:  Patent. Vertebral arteries:  Patent. No evidence of high-grade stenosis, large vessel occlusion, or aneurysm. IMPRESSION: 1. Small area of low diffusion consistent with acute infarction in right posterior frontal lobe. 2. Small left superior parietal cortical T2 FLAIR hyperintense focus with mildly diminished diffusion, distinct from the right frontal infarct, probably a subacute infarction. 3. Punctate left superior cerebellar, likely acute infarct. 4. No intracranial hemorrhage identified. 5. Normal MRA of the head. No evidence of high-grade stenosis, large vessel occlusion, or aneurysm. These results will be called to the ordering clinician or representative by the Radiologist Assistant, and communication documented in the PACS or zVision Dashboard. Electronically Signed   By: Mitzi Hansen M.D.   On: 07/27/2016 18:02   Ct Head Code Stroke W/o Cm  Result Date: 07/27/2016 CLINICAL DATA:  Code stroke.  Intermittent slurred speech. EXAM: CT HEAD WITHOUT CONTRAST TECHNIQUE: Contiguous axial images were obtained from the base of the skull through the vertex without intravenous contrast. COMPARISON:  None. FINDINGS: Brain: No evidence of acute infarction, hemorrhage, hydrocephalus, extra-axial collection or mass lesion/mass effect. Vascular: No hyperdense vessel. Mild calcific atherosclerosis of paraclinoid internal carotid arteries. Skull: Normal. Negative for fracture or focal lesion. Sinuses/Orbits: No acute finding. Other: None. ASPECTS [facility] Stroke Program Early CT Score) - Ganglionic level infarction (caudate, lentiform nuclei, internal  capsule, insula, M1-M3 cortex): 7 - Supraganglionic infarction (M4-M6 cortex): 3 Total  score (0-10 with 10 being normal): 10 IMPRESSION: 1. No acute intracranial abnormality identified. Unremarkable CT of the head. 2. ASPECTS is 10 These results were called by telephone at the time of interpretation on 07/27/2016 at 2:31 pm to Dr. Elita Quick, who verbally acknowledged these results. Electronically Signed   By: Mitzi Hansen M.D.   On: 07/27/2016 14:32    Discharge Instructions: Discharge Instructions    Ambulatory referral to Neurology    Complete by:  As directed    Pt will follow up with Dr. Roda Shutters at [facility] [place] in about 2 months. Thanks.      Signed: Althia Forts, MD 07/30/2016, 1:27 PM   Pager: [phone]

## 2016-07-31 ENCOUNTER — Telehealth: Payer: Self-pay | Admitting: Cardiovascular Disease

## 2016-07-31 ENCOUNTER — Encounter (HOSPITAL_COMMUNITY): Payer: Self-pay | Admitting: Internal Medicine

## 2016-07-31 LAB — PROTEIN C, TOTAL: Protein C, Total: 140 % (ref 60–150)

## 2016-07-31 LAB — BETA-2-GLYCOPROTEIN I ABS, IGG/M/A
Beta-2 Glyco I IgG: <9 GPI IgG units (ref 0–20)
Beta-2-Glycoprotein I IgA: <9 GPI IgA units (ref 0–25)
Beta-2-Glycoprotein I IgM: <9 GPI IgM units (ref 0–32)

## 2016-07-31 LAB — HOMOCYSTEINE: Homocysteine: 32 umol/L — ABNORMAL HIGH (ref 0.0–15.0)

## 2016-07-31 NOTE — Telephone Encounter (Signed)
New Message     Pt is to have a appt with Dr Excell Seltzerooper by 3/21 , had loop recorder install, Dr Excell Seltzerooper does not have any open appts she refuses to see PA, how would you like me to schedule her?

## 2016-07-31 NOTE — Telephone Encounter (Signed)
Pt aware Dr Excell Seltzerooper will be working on getting her an appt in the near future.

## 2016-07-31 NOTE — Telephone Encounter (Signed)
Already notified by Dr Roda ShuttersXu and Dr Johney FrameAllred - will work on getting her an appt in the near future. thanks

## 2016-07-31 NOTE — Telephone Encounter (Signed)
Pt states she was recently hospitalized for a stroke, had loop recorder inserted. Pt was told during hospitalization that she had a hole in her heart, (discharge summary indicates PFO) and to schedule an appointment  with Dr Excell Seltzerooper for further evaluation.  Pt advised I will forward to Dr Excell Seltzerooper for review and recommendations for follow up appointment.

## 2016-07-31 NOTE — Telephone Encounter (Signed)
I spoke with the pt and scheduled her to see Dr Excell Seltzerooper on 08/04/16.

## 2016-08-01 ENCOUNTER — Ambulatory Visit (INDEPENDENT_AMBULATORY_CARE_PROVIDER_SITE_OTHER): Payer: BC Managed Care – PPO | Admitting: Family Medicine

## 2016-08-01 VITALS — BP 102/64 | HR 108 | Temp 97.9°F | Resp 14 | Wt 194.0 lb

## 2016-08-01 DIAGNOSIS — Q2112 Patent foramen ovale: Secondary | ICD-10-CM

## 2016-08-01 DIAGNOSIS — Z09 Encounter for follow-up examination after completed treatment for conditions other than malignant neoplasm: Secondary | ICD-10-CM | POA: Diagnosis not present

## 2016-08-01 DIAGNOSIS — Z992 Dependence on renal dialysis: Secondary | ICD-10-CM | POA: Diagnosis not present

## 2016-08-01 DIAGNOSIS — I120 Hypertensive chronic kidney disease with stage 5 chronic kidney disease or end stage renal disease: Secondary | ICD-10-CM | POA: Diagnosis not present

## 2016-08-01 DIAGNOSIS — G4709 Other insomnia: Secondary | ICD-10-CM

## 2016-08-01 DIAGNOSIS — D696 Thrombocytopenia, unspecified: Secondary | ICD-10-CM

## 2016-08-01 DIAGNOSIS — L509 Urticaria, unspecified: Secondary | ICD-10-CM | POA: Diagnosis not present

## 2016-08-01 DIAGNOSIS — N185 Chronic kidney disease, stage 5: Secondary | ICD-10-CM

## 2016-08-01 DIAGNOSIS — I6389 Other cerebral infarction: Secondary | ICD-10-CM

## 2016-08-01 DIAGNOSIS — I638 Other cerebral infarction: Secondary | ICD-10-CM

## 2016-08-01 DIAGNOSIS — Q211 Atrial septal defect: Secondary | ICD-10-CM

## 2016-08-01 DIAGNOSIS — F419 Anxiety disorder, unspecified: Secondary | ICD-10-CM

## 2016-08-01 LAB — FACTOR 5 LEIDEN

## 2016-08-01 NOTE — Progress Notes (Signed)
Elizabeth Anthony  MRN: [id number] DOB: May 02, 1971  Subjective:  HPI  Patient is here for hospital follow up. Dates of admission were 07/27/16-07/29/16. Diagnoses:  1)CVA-had CT of the head done, MRI/MRA of the head, CTA of the head and neck done-findings showed acute infarct. 2) Thrombocytopenia: platelets were in 60s then improved to 70s, etiology was not certain, recommended to follow up with rheumatology due to elevated ANA-possible underlying rheumatological condition that may explain her findings. 3) Hypercalcemia-stop calcium supplements. But patient has restarted her calcium per nephrologist advice. She was also advised to stop Metoprolol due to low b/p. Patient states her b/p drops even lower when she stands up. She has followed up with cardiologist and had loop recorder inserted-chest soreness from that. She has had more anxiety the past couple days, not able to sleep last night. Having hives and itching all over. She is not sure what is happening why she is feeling this way. Some tingling sensation in the back and legs. Patient states she has PFO and she needs to have this re paired and has appointments set up for that. Medications have been reconciled with discharge medications and updated. 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 Patient Active Problem List   Diagnosis Date Noted  . PD catheter dysfunction ([place])   . PFO (patent foramen ovale)   . Stroke (cerebrum) ([place]) 07/27/2016  . ESRD on peritoneal dialysis ([address]) 07/27/2016  . Hypercalcemia 07/27/2016  . Thrombocytopenia ([place]) 07/27/2016  . History of anemia due to CKD 07/27/2016  . Episodes of speech arrest   . TIA (transient ischemic attack)   . Insomnia 09/17/2015  . Muscle spasm 07/07/2015  . Acute anxiety 07/07/2015  . Abnormal EKG 01/26/2015  . Sleep apnea 01/26/2015  . 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 Umbilical hernia without obstruction or gangrene 01/26/2015  . Cervicalgia 01/26/2015  . Pain in joint, forearm  01/26/2015  . Papanicolaou smear of cervix with low grade squamous intraepithelial lesion (LGSIL) 01/26/2015  . Premenopausal menorrhagia 01/26/2015  . Irregular menses 01/26/2015  . Benign hypertension with CKD (chronic kidney disease) stage V ([place]) 01/26/2015  . Kidney disease, chronic, stage V (end stage, EGFR < 15 ml/min) ([place]) 01/26/2015  . Fatigue 01/26/2015  . Skin photosensitivity 01/26/2015  . Rhinitis, allergic 01/26/2015  . Calf pain 01/26/2015  . Piriformis muscle pain 01/26/2015  . Anemia in CKD (chronic kidney disease) 01/26/2015  . Knee pain 01/26/2015  . Vitamin D deficiency 01/26/2015    Past Medical History:  Diagnosis Date  . Allergy   . Anemia   . Anxiety   . Hypertension   . Migraine   . PONV (postoperative nausea and vomiting)   . Renal disorder    Dialysis M/W/F  . Sleep apnea    uses cpap    Social History   Social History  . Marital status: Single    Spouse name: N/A  . Number of children: N/A  . Years of education: N/A   Occupational History  . Not on file.   Social History Main Topics  . Smoking status: Never Smoker  . Smokeless tobacco: Never Used  . Alcohol use No  . Drug use: No  . Sexual activity: Not on file   Other Topics Concern  . Not on file   Social History Narrative  . No narrative on file    Outpatient Encounter Prescriptions as of 08/01/2016  Medication Sig  . calcitRIOL (ROCALTROL) 0.5 MCG capsule Take 0.5 mcg by mouth daily. 2 capsules  twice daily  . calcium acetate (PHOSLO) 667 MG capsule Take 667 mg by mouth 4 (four) times daily.  . multivitamin (RENA-VIT) TABS tablet Take 1 tablet by mouth at bedtime.  . sevelamer carbonate (RENVELA) 800 MG tablet Take 800-2,400 mg by mouth See admin instructions. Take 2400 mg by mouth 3 times daily with meals. Take 800 mg by mouth with snacks.  . ALPRAZolam (XANAX) 0.5 MG tablet Take 1 tablet (0.5 mg total) by mouth 2 (two) times daily as needed for anxiety. (Patient not taking:  Reported on 02/28/2016)  . aspirin EC 81 MG EC tablet Take 1 tablet (81 mg total) by mouth daily. (Patient not taking: Reported on 08/01/2016)  . lanthanum (FOSRENOL) 1000 MG chewable tablet Chew 1 tablet (1,000 mg total) by mouth 3 (three) times daily with meals. (Patient not taking: Reported on 08/01/2016)  . [DISCONTINUED] cetirizine (ZYRTEC) 10 MG tablet Take 1 tablet (10 mg total) by mouth daily. (Patient not taking: Reported on 02/28/2016)  . [DISCONTINUED] oxyCODONE-acetaminophen (ROXICET) 5-325 MG tablet Take 1 tablet by mouth every 6 (six) hours as needed. (Patient not taking: Reported on 02/28/2016)   Facility-Administered Encounter Medications as of 08/01/2016  Medication  . hepatitis B vaccine (ENGERIX-B) injection 20 mcg    Allergies  Allergen Reactions  . Ace Inhibitors Other (See Comments)    Uncontrollable Coughing & Choking  . Dye Fdc Red [Red Dye] Other (See Comments)    Kidney failure  . Nsaids Other (See Comments)    Shoots creatine up  . Vancomycin Swelling, Rash and Other (See Comments)    Entire skin peeled, sores in throat  . Alcohol-Sulfur [Sulfur] Other (See Comments)    Itching, numbness of mouth  . Alcohol Itching and Other (See Comments)    Hard liquor (Vodka)    Review of Systems  Constitutional: Negative.   HENT: Positive for congestion.   Respiratory: Negative.   Cardiovascular: Positive for palpitations (yesterday).  Gastrointestinal: Negative.   Musculoskeletal: Positive for neck pain (soreness).  Skin: Positive for itching.       Hives all over her body  Neurological: Positive for dizziness (with standing up too fast.) and tingling.  Endo/Heme/Allergies: Negative.   Psychiatric/Behavioral: The patient is nervous/anxious and has insomnia.     Objective:  BP 102/64   Pulse (!) 108   Temp 97.9 F (36.6 C)   Resp 14   Wt 194 lb (88 kg)   SpO2 97%   BMI 34.37 kg/m   Physical Exam  Constitutional: She is oriented to person, place, and time  and well-developed, well-nourished, and in no distress.  HENT:  Head: Normocephalic and atraumatic.  Right Ear: External ear normal.  Left Ear: External ear normal.  Nose: Nose normal.  Eyes: Conjunctivae are normal. Pupils are equal, round, and reactive to light.  Neck: Normal range of motion. Neck supple.  Cardiovascular: Normal rate, regular rhythm, normal heart sounds and intact distal pulses.   No murmur heard. Pulmonary/Chest: Effort normal and breath sounds normal. No respiratory distress. She has no wheezes.  Abdominal: Soft.  Neurological: She is alert and oriented to person, place, and time. No cranial nerve deficit. Coordination normal.  Skin: Skin is warm and dry.  Mild urticaria   Psychiatric: Mood, memory, affect and judgment normal.    Assessment and Plan :  1. Hospital discharge follow-up Records reviewed. Medications updated current against discharge medications.  2. Cerebrovascular accident (CVA) due to other mechanism [facility]) Following cardiologist.  3. Benign hypertension with  CKD (chronic kidney disease) stage V ([place]) Following nephrologist.  4. Other insomnia  5. Acute anxiety Discussed this in detail. I do not think it would be safe for patient to take Xanax with work she does.  6. PFO (patent foramen ovale)  7. Thrombocytopenia ([place])  8. Hypercalcemia Re started Calcium supplements per nephrologist. They suppose to be re checking labs and advised patient to get these to [place]. 9. Urticaria Advised patient to re start Zyrtec. Follow as needed. patient advised to let us know if symptoms do not improve.  10. Peritoneal dialysis status. Discussed disability form we received from her work. 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 This form is a request from dates of 07/05/15 to 12/14/15. Will fax over the form Dr Venia Minks filled out last year and see if this will be sufficient enough. Patient never submitted this form last year  and was told she needs to submit this to get some coverage for the days she missed.  HPI, Exam and A&P transcribed under direction and in the presence of Miguel Aschoff, MD.  I have done the exam and reviewed the chart and it is accurate to the best of my knowledge. Development worker, community has been used and  any errors in dictation or transcription are unintentional. Miguel Aschoff M.D. [facility]

## 2016-08-02 LAB — PROTHROMBIN GENE MUTATION

## 2016-08-04 ENCOUNTER — Ambulatory Visit (INDEPENDENT_AMBULATORY_CARE_PROVIDER_SITE_OTHER): Payer: BC Managed Care – PPO | Admitting: Cardiovascular Disease

## 2016-08-04 ENCOUNTER — Encounter: Payer: Self-pay | Admitting: *Deleted

## 2016-08-04 VITALS — BP 124/70 | HR 120 | Ht 63.5 in | Wt 192.0 lb

## 2016-08-04 DIAGNOSIS — Q211 Atrial septal defect: Secondary | ICD-10-CM

## 2016-08-04 DIAGNOSIS — Q2112 Patent foramen ovale: Secondary | ICD-10-CM

## 2016-08-04 NOTE — Progress Notes (Signed)
Cardiology Office Note Date:  08/06/2016   ID:  Elizabeth Anthony, DOB [birth year], MRN [id number]  PCP:  [name], MD  Cardiologist:  [name], Ryiah Bellissimo, MD   Referring: Dr Roda ShuttersXu  Chief Complaint  Patient presents with  . PFO consult     History of Present Illness: Elizabeth Anthony is a 46 y.o. female who is referred by Dr Roda ShuttersXu for evaluation of PFO closure.   The patient presented 07/28/2016 with expressive aphasia. Symptoms were transient lasting less than 30 minutes. A motor symptoms. She did not have visual complaints. Stroke evaluation was performed and demonstrated all to pull areas of acute/subacute infarction in the right posterior frontal lobe, left superior parietal lobe, and left superior cerebellum. Surface echocardiogram demonstrated no significant structural heart disease with normal LV function and no valvular problems. There was no large vessel intracranial or extracranial disease present. However, a transcranial Doppler study was strongly positive suggestive of intracardiac right to left shunting.  The patient is here alone today. She is a middle school vice principal. 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 There has been discussion about her returning to intermittent hemodialysis. An AV fistula was unsuccessful. She has had a tunneled dialysis catheter in place. There is concerned that her tunneled catheter could have been a source of thrombus and caused paradoxical embolism.  The patient denies any recurrent neurologic symptoms. She complains of significant anxiety, otherwise doing ok. Today, she denies symptoms of palpitations, chest pain, shortness of breath, orthopnea, PND, lower extremity edema, dizziness, or syncope. An implantable loop recorder was placed and has demonstrated no arrhythmia to date.   Past Medical History:  Diagnosis Date  . Allergy   . Anemia   . Anxiety   . Hypertension   .  Migraine   . PONV (postoperative nausea and vomiting)   . Renal disorder    Dialysis M/W/F  . Sleep apnea    uses cpap    Past Surgical History:  Procedure Laterality Date  . BASCILIC VEIN TRANSPOSITION Left 09/30/2015   Procedure: LEFT ARM FIRST STAGE BASILIC VEIN TRANSPOSITION;  Surgeon: Nada LibmanVance W Brabham, MD;  Location: [facility] OR;  Service: Vascular;  Laterality: Left;  . BASCILIC VEIN TRANSPOSITION Left 11/30/2015   Procedure: SECOND STAGE BASILIC VEIN TRANSPOSITION, left arm;  Surgeon: Nada LibmanVance W Brabham, MD;  [facility];  Service: Vascular;  Laterality: Left;  . BREAST REDUCTION SURGERY Bilateral   . insertion hemodialysis catheter    . LOOP RECORDER INSERTION N/A 07/28/2016   Procedure: Loop Recorder Insertion;  Surgeon: Hillis RangeJames Allred, MD;  Location: [facility] INVASIVE CV LAB;  Service: Cardiovascular;  Laterality: N/A;  . REDUCTION MAMMAPLASTY  1998    Current Outpatient Prescriptions  Medication Sig Dispense Refill  . ALPRAZolam (XANAX) 0.5 MG tablet Take 1 tablet (0.5 mg total) by mouth 2 (two) times daily as needed for anxiety. 20 tablet 0  . aspirin EC 81 MG EC tablet Take 1 tablet (81 mg total) by mouth daily. 30 tablet 0  . calcitRIOL (ROCALTROL) 0.5 MCG capsule Take 0.5 mcg by mouth daily. 2 capsules twice daily    . calcium acetate (PHOSLO) 667 MG capsule Take 667 mg by mouth 4 (four) times daily.    . multivitamin (RENA-VIT) TABS tablet Take 1 tablet by mouth at bedtime.  11  . NON FORMULARY Dialysis. AT home 7 days a week    . sevelamer carbonate (RENVELA) 800 MG tablet Take 800-2,400 mg  by mouth See admin instructions. Take 2400 mg by mouth 3 times daily with meals. Take 800 mg by mouth with snacks.     Current Facility-Administered Medications  Medication Dose Route Frequency Provider Last Rate Last Dose  . hepatitis B vaccine (ENGERIX-B) injection 20 mcg  1 mL Intramuscular Once Lorie Phenix, MD        Allergies:   Ace inhibitors; Dye fdc red [red dye]; Nsaids; Vancomycin;  Alcohol-sulfur [sulfur]; and Alcohol   Social History:  The patient  reports that she has never smoked. She has never used smokeless tobacco. She reports that she does not drink alcohol or use drugs.   Family History:  The patient's family history includes Breast cancer (age of onset: 81) in her mother; Cancer in her mother; Kidney disease in her mother.    ROS:  Please see the history of present illness.  Otherwise, review of systems is positive for dizziness, constipation.  All other systems are reviewed and negative.    PHYSICAL EXAM: VS:  BP 124/70   Pulse (!) 120   Ht 5' 3.5" (1.613 m)   Wt 192 lb (87.1 kg)   BMI 33.48 kg/m  , BMI Body mass index is 33.48 kg/m. GEN: Well nourished, well developed, pleasant young woman  in no acute distress  HEENT: normal  Neck: no JVD, no masses. No carotid bruits Cardiac: RRR without murmur or gallop                Respiratory:  clear to auscultation bilaterally, normal work of breathing GI: soft, nontender, nondistended, + BS MS: no deformity or atrophy  Ext: no pretibial edema, pedal pulses 2+= bilaterally Skin: warm and dry, no rash Neuro:  Strength and sensation are intact Psych: euthymic mood, full affect  EKG:  EKG is not ordered today.  Recent Labs: 07/27/2016: ALT 17; TSH 2.534 07/29/2016: BUN 49; Creatinine, Ser 16.23; Hemoglobin 10.7; Platelets 78; Potassium 3.5; Sodium 132   Lipid Panel     Component Value Date/Time   CHOL 124 07/27/2016 1950   CHOL 185 03/29/2015 0903   TRIG 249 (H) 07/27/2016 1950   HDL 34 (L) 07/27/2016 1950   HDL 46 03/29/2015 0903   CHOLHDL 3.6 07/27/2016 1950   VLDL 50 (H) 07/27/2016 1950   LDLCALC 40 07/27/2016 1950   LDLCALC 111 (H) 03/29/2015 0903      Wt Readings from Last 3 Encounters:  08/04/16 192 lb (87.1 kg)  08/01/16 194 lb (88 kg)  07/29/16 188 lb 15 oz (85.7 kg)     Cardiac Studies Reviewed: 2D Echo: Study Conclusions  - Left ventricle: The cavity size was normal. Wall  thickness was   increased in a pattern of mild LVH. Systolic function was   vigorous. The estimated ejection fraction was in the range of 65%   to 70%. Wall motion was normal; there were no regional wall   motion abnormalities. Doppler parameters are consistent with   abnormal left ventricular relaxation (grade 1 diastolic   dysfunction). - Aortic valve: There was no stenosis. - Mitral valve: There was no significant regurgitation. - Right ventricle: The cavity size was normal. Systolic function   was normal. - Pulmonary arteries: No complete TR doppler jet so unable to   estimate PA systolic pressure. - Inferior vena cava: The vessel was normal in size. The   respirophasic diameter changes were in the normal range (>= 50%),   consistent with normal central venous pressure.  Impressions:  - Normal  LV size with EF 65-70%, mild LV hypertrophy. Normal RV   size and systolic function. No significant valvular   abnormalities.  Mr Mra Head Wo Contrast 07/27/2016 1. Small area of low diffusion consistent with acute infarction in right posterior frontal lobe.  2. Small left superior parietal cortical T2 FLAIR hyperintense focus with mildly diminished diffusion, distinct from the right frontal infarct, probably a subacute infarction.  3. Punctate left superior cerebellar, likely acute infarct.  4. No intracranial hemorrhage identified.  5. Normal MRA of the head. No evidence of high-grade stenosis, large vessel occlusion, or aneurysm.   Ct Head Code Stroke W/o Cm 07/27/2016 1. No acute intracranial abnormality identified. Unremarkable CT of the head.  2. ASPECTS is 10   LE venous doppler - no DVT  TCD bubble study - spencer degree IV at rest, but full curtain effect on valsalva.  ASSESSMENT AND PLAN: 1.  PFO with cryptogenic stroke: multiple embolic strokes identified on brain MRI studies.   Radiographic results, hospital notes, lab results, and cardiac imaging data are reviewed.  Transcranial doppler shows large right-to-left shunt (Spencer Grade IV at rest and curtain effect with Valsalva suggestive of large interatrial septal communication likely PFO).   The patient's Rope Score is 8, indicating a high probability that the patient's stroke is 'PFO-related.'  The patient is counseled about the association of PFO and cryptogenic stroke. Available clinical trial data is reviewed, specifically those trials comparing transcatheter PFO closure and medical therapy with antiplatelet drugs. The patient understands the potential benefit of PFO closure with respect to secondary stroke reduction compared with medical therapy alone. Specific risks of transcatheter PFO closure are reviewed with the patient. These risks include bleeding, infection, device embolization, stroke, cardiac perforation, tamponade, arrhythmia, MI, and late device erosion. She understands these serious risks occur at low incidence of < 1%.   Prior to undergoing PFO closure, a TEE is indicated so that her interatrial septal anatomy can be evaluated. I reviewed the risks of TEE as well indications and she agrees to proceed.   The patient's dialysis catheter will be removed next week.   Current medicines are reviewed with the patient today.  The patient does not have concerns regarding medicines.  Labs/ tests ordered today include:  No orders of the defined types were placed in this encounter.  Disposition:   FU pending TEE result. If TEE confirms a PFO, plan to schedule transcatheter closure at the next available time. Will discuss timing with Dr Xu in the context of her implantable loop recorder (decide on duration of monitoring necessary to exclude atrial fibrillation).  Signed, Silvia Hightower, MD  08/06/2016 2:10 PM    [facility] [address], [place], [place]  [address] Phone: [phone]; Fax: [phone]  

## 2016-08-04 NOTE — Patient Instructions (Signed)
Your physician recommends that you continue on your current medications as directed. Please refer to the Current Medication list given to you today.  Your physician has requested that you have a TEE. During a TEE, sound waves are used to create images of your heart. It provides your doctor with information about the size and shape of your heart and how well your heart's chambers and valves are working. In this test, a transducer is attached to the end of a flexible tube that's guided down your throat and into your esophagus (the tube leading from you mouth to your stomach) to get a more detailed image of your heart. You are not awake for the procedure. Please see the instruction sheet given to you today. For further information please visit [email or web address].    

## 2016-08-06 ENCOUNTER — Encounter: Payer: Self-pay | Admitting: Cardiovascular Disease

## 2016-08-10 ENCOUNTER — Telehealth: Payer: Self-pay | Admitting: Cardiology

## 2016-08-10 NOTE — Telephone Encounter (Signed)
LMOVM requesting that pt send manual transmission b/c home monitor has not updated in at least 14 days.    

## 2016-08-11 ENCOUNTER — Ambulatory Visit (INDEPENDENT_AMBULATORY_CARE_PROVIDER_SITE_OTHER): Payer: BC Managed Care – PPO | Admitting: *Deleted

## 2016-08-11 DIAGNOSIS — I639 Cerebral infarction, unspecified: Secondary | ICD-10-CM

## 2016-08-11 LAB — CUP PACEART INCLINIC DEVICE CHECK
Date Time Interrogation Session: 20180330140237
Implantable Pulse Generator Implant Date: 20180316

## 2016-08-11 NOTE — Progress Notes (Signed)
Wound check appointment. Steri-strips removed. Wound without redness or edema. Incision edges approximated, wound well healed. Normal device function. Battery status: good. R-waves 0.62mV. No symptom, tachy, pause, brady, or AF episodes. Histograms right-shifted, Toprol-XL d/c by Dr. Darrick Penna due to hypotension per patient. Patient educated about Carelink monitor. Monthly summary reports and ROV with JA PRN.

## 2016-08-14 ENCOUNTER — Ambulatory Visit (HOSPITAL_COMMUNITY)
Admission: RE | Admit: 2016-08-14 | Discharge: 2016-08-14 | Disposition: A | Discharge status: Home / Self Care | Payer: BC Managed Care – PPO | Source: Ambulatory Visit | Attending: Cardiovascular Disease | Admitting: Cardiovascular Disease

## 2016-08-14 ENCOUNTER — Ambulatory Visit (HOSPITAL_BASED_OUTPATIENT_CLINIC_OR_DEPARTMENT_OTHER): Payer: BC Managed Care – PPO

## 2016-08-14 ENCOUNTER — Encounter (HOSPITAL_COMMUNITY): Payer: Self-pay | Admitting: *Deleted

## 2016-08-14 ENCOUNTER — Encounter (HOSPITAL_COMMUNITY)
Admission: RE | Disposition: A | Discharge status: Home / Self Care | Payer: Self-pay | Source: Ambulatory Visit | Attending: Cardiovascular Disease

## 2016-08-14 ENCOUNTER — Other Ambulatory Visit: Payer: Self-pay

## 2016-08-14 ENCOUNTER — Telehealth: Payer: Self-pay | Admitting: Neurology

## 2016-08-14 DIAGNOSIS — I638 Other cerebral infarction: Secondary | ICD-10-CM | POA: Diagnosis not present

## 2016-08-14 DIAGNOSIS — Z79899 Other long term (current) drug therapy: Secondary | ICD-10-CM | POA: Diagnosis not present

## 2016-08-14 DIAGNOSIS — N186 End stage renal disease: Secondary | ICD-10-CM | POA: Insufficient documentation

## 2016-08-14 DIAGNOSIS — Z91048 Other nonmedicinal substance allergy status: Secondary | ICD-10-CM | POA: Diagnosis not present

## 2016-08-14 DIAGNOSIS — F419 Anxiety disorder, unspecified: Secondary | ICD-10-CM | POA: Diagnosis not present

## 2016-08-14 DIAGNOSIS — R188 Other ascites: Secondary | ICD-10-CM | POA: Insufficient documentation

## 2016-08-14 DIAGNOSIS — I639 Cerebral infarction, unspecified: Secondary | ICD-10-CM | POA: Insufficient documentation

## 2016-08-14 DIAGNOSIS — Z9102 Food additives allergy status: Secondary | ICD-10-CM | POA: Diagnosis not present

## 2016-08-14 DIAGNOSIS — Z992 Dependence on renal dialysis: Secondary | ICD-10-CM | POA: Insufficient documentation

## 2016-08-14 DIAGNOSIS — G4730 Sleep apnea, unspecified: Secondary | ICD-10-CM | POA: Insufficient documentation

## 2016-08-14 DIAGNOSIS — Z882 Allergy status to sulfonamides status: Secondary | ICD-10-CM | POA: Insufficient documentation

## 2016-08-14 DIAGNOSIS — Q211 Atrial septal defect: Secondary | ICD-10-CM | POA: Diagnosis present

## 2016-08-14 DIAGNOSIS — Z23 Encounter for immunization: Secondary | ICD-10-CM | POA: Diagnosis not present

## 2016-08-14 DIAGNOSIS — Z841 Family history of disorders of kidney and ureter: Secondary | ICD-10-CM | POA: Insufficient documentation

## 2016-08-14 DIAGNOSIS — Z7982 Long term (current) use of aspirin: Secondary | ICD-10-CM | POA: Diagnosis not present

## 2016-08-14 DIAGNOSIS — Z803 Family history of malignant neoplasm of breast: Secondary | ICD-10-CM | POA: Diagnosis not present

## 2016-08-14 DIAGNOSIS — Z881 Allergy status to other antibiotic agents status: Secondary | ICD-10-CM | POA: Insufficient documentation

## 2016-08-14 DIAGNOSIS — I6340 Cerebral infarction due to embolism of unspecified cerebral artery: Secondary | ICD-10-CM | POA: Diagnosis present

## 2016-08-14 DIAGNOSIS — I120 Hypertensive chronic kidney disease with stage 5 chronic kidney disease or end stage renal disease: Secondary | ICD-10-CM | POA: Diagnosis not present

## 2016-08-14 HISTORY — PX: TEE WITHOUT CARDIOVERSION: SHX5443

## 2016-08-14 SURGERY — ECHOCARDIOGRAM, TRANSESOPHAGEAL
Anesthesia: Moderate Sedation

## 2016-08-14 MED ORDER — MIDAZOLAM HCL 5 MG/ML IJ SOLN
INTRAMUSCULAR | Status: AC
  Filled 2016-08-14: qty 2

## 2016-08-14 MED ORDER — SODIUM CHLORIDE 0.9 % IV SOLN
INTRAVENOUS | Status: DC
Start: 2016-08-14 — End: 2016-08-14

## 2016-08-14 MED ORDER — FENTANYL CITRATE (PF) 100 MCG/2ML IJ SOLN
INTRAMUSCULAR | Status: AC
  Filled 2016-08-14: qty 2

## 2016-08-14 MED ORDER — FENTANYL CITRATE (PF) 100 MCG/2ML IJ SOLN
INTRAMUSCULAR | Status: DC | PRN
  Administered 2016-08-14 (×2): 25 ug via INTRAVENOUS

## 2016-08-14 MED ORDER — ALPRAZOLAM 0.25 MG PO TABS
ORAL_TABLET | ORAL | Status: AC
  Administered 2016-08-14: 0.5 mg via ORAL
  Filled 2016-08-14: qty 2

## 2016-08-14 MED ORDER — BUTAMBEN-TETRACAINE-BENZOCAINE 2-2-14 % EX AERO
INHALATION_SPRAY | CUTANEOUS | Status: DC | PRN
Start: 2016-08-14 — End: 2016-08-14
  Administered 2016-08-14: 2 via TOPICAL

## 2016-08-14 MED ORDER — ALPRAZOLAM 0.5 MG PO TABS: 0.5000 mg | ORAL_TABLET | Freq: Two times a day (BID) | ORAL | 3 refills | Status: AC | PRN

## 2016-08-14 MED ORDER — MIDAZOLAM HCL 10 MG/2ML IJ SOLN
INTRAMUSCULAR | Status: DC | PRN
  Administered 2016-08-14 (×2): 2 mg via INTRAVENOUS

## 2016-08-14 MED ORDER — ALPRAZOLAM 0.5 MG PO TABS
0.5000 mg | ORAL_TABLET | Freq: Once | ORAL | Status: AC
  Administered 2016-08-14: 0.5 mg via ORAL

## 2016-08-14 NOTE — Interval H&P Note (Signed)
History and Physical Interval Note:  08/14/2016 9:10 AM  Elizabeth Anthony  has presented today for surgery, with the diagnosis of PFO  The various methods of treatment have been discussed with the patient and family. After consideration of risks, benefits and other options for treatment, the patient has consented to  Procedure(s): TRANSESOPHAGEAL ECHOCARDIOGRAM (TEE) (N/A) as a surgical intervention .  The patient's history has been reviewed, patient examined, no change in status, stable for surgery.  I have reviewed the patient's chart and labs.  Questions were answered to the patient's satisfaction.     Ruth Tully

## 2016-08-14 NOTE — Op Note (Signed)
INDICATIONS: cryptogenic stroke  PROCEDURE:   Informed consent was obtained prior to the procedure. The risks, benefits and alternatives for the procedure were discussed and the patient comprehended these risks.  Risks include, but are not limited to, cough, sore throat, vomiting, nausea, somnolence, esophageal and stomach trauma or perforation, bleeding, low blood pressure, aspiration, pneumonia, infection, trauma to the teeth and death.    After a procedural time-out, the oropharynx was anesthetized with 20% benzocaine spray.   During this procedure the patient was administered a total of Versed 5 mg and Fentanyl 50 mccg to achieve and maintain moderate conscious sedation.  The patient's heart rate, blood pressure, and oxygen saturationweare monitored continuously during the procedure. The period of conscious sedation was 19 minutes, of which I was present face-to-face 100% of this time.  The transesophageal probe was inserted in the esophagus and stomach without difficulty and multiple views were obtained.  The patient was kept under observation until the patient left the procedure room.  The patient left the procedure room in stable condition.   Agitated microbubble saline contrast was administered.  COMPLICATIONS:    There were no immediate complications.  FINDINGS:  Normal chamber size and function, normal valves. A large immobile mass is seen in the right atrium, attached to the free wall, without any relationship to the tricuspid valve or septum. There is a highly reflective and mobile filament attached to this mass. No evidence of PFO by color Doppler or saline contrast is seen at rest or following cough, but a few saline bubbles are seen in the aorta late after injection, suggesting pulmonary shunting. Small quantity of ascites is seen, related to PD.  RECOMMENDATIONS:     Consider cardiac MRI  Time Spent Directly with the Patient:  30 minutes   Elizabeth Anthony 08/14/2016,  9:11 AM

## 2016-08-14 NOTE — Telephone Encounter (Signed)
I got a call from Dr. Jomarie Longs  about patient's TEE showing right atrial clot. I personally reviewed the images with him. I called the patient at home and advise her to start warfarin for a period of 6 months to the cortisols. She will need follow-up at the Coumadin clinic. I will try to call and set up her appointment with them. She was advised to take aspirin in the interim. Interestingly she had a strongly positive TCD bubble study done by Dr. Roda Shutters but TEE bubble study was negative. I will leave it up to Dr. Roda Shutters to decide if he needs further workup for pulmonary AVM at a later stage.

## 2016-08-14 NOTE — H&P (View-Only) (Signed)
Cardiology Office Note Date:  08/06/2016   ID:  Elizabeth Anthony, DOB [birth year], MRN [id number]  PCP:  [name], MD  Cardiologist:  [name], Evynn Boutelle, MD   Referring: Dr Roda ShuttersXu  Chief Complaint  Patient presents with  . PFO consult     History of Present Illness: Elizabeth Anthony is a 46 y.o. female who is referred by Dr Roda ShuttersXu for evaluation of PFO closure.   The patient presented 07/28/2016 with expressive aphasia. Symptoms were transient lasting less than 30 minutes. A motor symptoms. She did not have visual complaints. Stroke evaluation was performed and demonstrated all to pull areas of acute/subacute infarction in the right posterior frontal lobe, left superior parietal lobe, and left superior cerebellum. Surface echocardiogram demonstrated no significant structural heart disease with normal LV function and no valvular problems. There was no large vessel intracranial or extracranial disease present. However, a transcranial Doppler study was strongly positive suggestive of intracardiac right to left shunting.  The patient is here alone today. She is a middle school vice principal. 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 There has been discussion about her returning to intermittent hemodialysis. An AV fistula was unsuccessful. She has had a tunneled dialysis catheter in place. There is concerned that her tunneled catheter could have been a source of thrombus and caused paradoxical embolism.  The patient denies any recurrent neurologic symptoms. She complains of significant anxiety, otherwise doing ok. Today, she denies symptoms of palpitations, chest pain, shortness of breath, orthopnea, PND, lower extremity edema, dizziness, or syncope. An implantable loop recorder was placed and has demonstrated no arrhythmia to date.   Past Medical History:  Diagnosis Date  . Allergy   . Anemia   . Anxiety   . Hypertension   .  Migraine   . PONV (postoperative nausea and vomiting)   . Renal disorder    Dialysis M/W/F  . Sleep apnea    uses cpap    Past Surgical History:  Procedure Laterality Date  . BASCILIC VEIN TRANSPOSITION Left 09/30/2015   Procedure: LEFT ARM FIRST STAGE BASILIC VEIN TRANSPOSITION;  Surgeon: Nada LibmanVance W Brabham, MD;  Location: [facility] OR;  Service: Vascular;  Laterality: Left;  . BASCILIC VEIN TRANSPOSITION Left 11/30/2015   Procedure: SECOND STAGE BASILIC VEIN TRANSPOSITION, left arm;  Surgeon: Nada LibmanVance W Brabham, MD;  [facility];  Service: Vascular;  Laterality: Left;  . BREAST REDUCTION SURGERY Bilateral   . insertion hemodialysis catheter    . LOOP RECORDER INSERTION N/A 07/28/2016   Procedure: Loop Recorder Insertion;  Surgeon: Hillis RangeJames Allred, MD;  Location: [facility] INVASIVE CV LAB;  Service: Cardiovascular;  Laterality: N/A;  . REDUCTION MAMMAPLASTY  1998    Current Outpatient Prescriptions  Medication Sig Dispense Refill  . ALPRAZolam (XANAX) 0.5 MG tablet Take 1 tablet (0.5 mg total) by mouth 2 (two) times daily as needed for anxiety. 20 tablet 0  . aspirin EC 81 MG EC tablet Take 1 tablet (81 mg total) by mouth daily. 30 tablet 0  . calcitRIOL (ROCALTROL) 0.5 MCG capsule Take 0.5 mcg by mouth daily. 2 capsules twice daily    . calcium acetate (PHOSLO) 667 MG capsule Take 667 mg by mouth 4 (four) times daily.    . multivitamin (RENA-VIT) TABS tablet Take 1 tablet by mouth at bedtime.  11  . NON FORMULARY Dialysis. AT home 7 days a week    . sevelamer carbonate (RENVELA) 800 MG tablet Take 800-2,400 mg  by mouth See admin instructions. Take 2400 mg by mouth 3 times daily with meals. Take 800 mg by mouth with snacks.     Current Facility-Administered Medications  Medication Dose Route Frequency Provider Last Rate Last Dose  . hepatitis B vaccine (ENGERIX-B) injection 20 mcg  1 mL Intramuscular Once Lorie Phenix, MD        Allergies:   Ace inhibitors; Dye fdc red [red dye]; Nsaids; Vancomycin;  Alcohol-sulfur [sulfur]; and Alcohol   Social History:  The patient  reports that she has never smoked. She has never used smokeless tobacco. She reports that she does not drink alcohol or use drugs.   Family History:  The patient's family history includes Breast cancer (age of onset: 81) in her mother; Cancer in her mother; Kidney disease in her mother.    ROS:  Please see the history of present illness.  Otherwise, review of systems is positive for dizziness, constipation.  All other systems are reviewed and negative.    PHYSICAL EXAM: VS:  BP 124/70   Pulse (!) 120   Ht 5' 3.5" (1.613 m)   Wt 192 lb (87.1 kg)   BMI 33.48 kg/m  , BMI Body mass index is 33.48 kg/m. GEN: Well nourished, well developed, pleasant young woman  in no acute distress  HEENT: normal  Neck: no JVD, no masses. No carotid bruits Cardiac: RRR without murmur or gallop                Respiratory:  clear to auscultation bilaterally, normal work of breathing GI: soft, nontender, nondistended, + BS MS: no deformity or atrophy  Ext: no pretibial edema, pedal pulses 2+= bilaterally Skin: warm and dry, no rash Neuro:  Strength and sensation are intact Psych: euthymic mood, full affect  EKG:  EKG is not ordered today.  Recent Labs: 07/27/2016: ALT 17; TSH 2.534 07/29/2016: BUN 49; Creatinine, Ser 16.23; Hemoglobin 10.7; Platelets 78; Potassium 3.5; Sodium 132   Lipid Panel     Component Value Date/Time   CHOL 124 07/27/2016 1950   CHOL 185 03/29/2015 0903   TRIG 249 (H) 07/27/2016 1950   HDL 34 (L) 07/27/2016 1950   HDL 46 03/29/2015 0903   CHOLHDL 3.6 07/27/2016 1950   VLDL 50 (H) 07/27/2016 1950   LDLCALC 40 07/27/2016 1950   LDLCALC 111 (H) 03/29/2015 0903      Wt Readings from Last 3 Encounters:  08/04/16 192 lb (87.1 kg)  08/01/16 194 lb (88 kg)  07/29/16 188 lb 15 oz (85.7 kg)     Cardiac Studies Reviewed: 2D Echo: Study Conclusions  - Left ventricle: The cavity size was normal. Wall  thickness was   increased in a pattern of mild LVH. Systolic function was   vigorous. The estimated ejection fraction was in the range of 65%   to 70%. Wall motion was normal; there were no regional wall   motion abnormalities. Doppler parameters are consistent with   abnormal left ventricular relaxation (grade 1 diastolic   dysfunction). - Aortic valve: There was no stenosis. - Mitral valve: There was no significant regurgitation. - Right ventricle: The cavity size was normal. Systolic function   was normal. - Pulmonary arteries: No complete TR doppler jet so unable to   estimate PA systolic pressure. - Inferior vena cava: The vessel was normal in size. The   respirophasic diameter changes were in the normal range (>= 50%),   consistent with normal central venous pressure.  Impressions:  - Normal  LV size with EF 65-70%, mild LV hypertrophy. Normal RV   size and systolic function. No significant valvular   abnormalities.  Mr Maxine Glenn Head Wo Contrast 07/27/2016 1. Small area of low diffusion consistent with acute infarction in right posterior frontal lobe.  2. Small left superior parietal cortical T2 FLAIR hyperintense focus with mildly diminished diffusion, distinct from the right frontal infarct, probably a subacute infarction.  3. Punctate left superior cerebellar, likely acute infarct.  4. No intracranial hemorrhage identified.  5. Normal MRA of the head. No evidence of high-grade stenosis, large vessel occlusion, or aneurysm.   Ct Head Code Stroke W/o Cm 07/27/2016 1. No acute intracranial abnormality identified. Unremarkable CT of the head.  2. ASPECTS is 10   LE venous doppler - no DVT  TCD bubble study - spencer degree IV at rest, but full curtain effect on valsalva.  ASSESSMENT AND PLAN: 1.  PFO with cryptogenic stroke: multiple embolic strokes identified on brain MRI studies.   Radiographic results, hospital notes, lab results, and cardiac imaging data are reviewed.  Transcranial doppler shows large right-to-left shunt (Spencer Grade IV at rest and curtain effect with Valsalva suggestive of large interatrial septal communication likely PFO).   The patient's Rope Score is 8, indicating a high probability that the patient's stroke is 'PFO-related.'  The patient is counseled about the association of PFO and cryptogenic stroke. Available clinical trial data is reviewed, specifically those trials comparing transcatheter PFO closure and medical therapy with antiplatelet drugs. The patient understands the potential benefit of PFO closure with respect to secondary stroke reduction compared with medical therapy alone. Specific risks of transcatheter PFO closure are reviewed with the patient. These risks include bleeding, infection, device embolization, stroke, cardiac perforation, tamponade, arrhythmia, MI, and late device erosion. She understands these serious risks occur at low incidence of < 1%.   Prior to undergoing PFO closure, a TEE is indicated so that her interatrial septal anatomy can be evaluated. I reviewed the risks of TEE as well indications and she agrees to proceed.   The patient's dialysis catheter will be removed next week.   Current medicines are reviewed with the patient today.  The patient does not have concerns regarding medicines.  Labs/ tests ordered today include:  No orders of the defined types were placed in this encounter.  Disposition:   FU pending TEE result. If TEE confirms a PFO, plan to schedule transcatheter closure at the next available time. Will discuss timing with Dr Roda Shutters in the context of her implantable loop recorder (decide on duration of monitoring necessary to exclude atrial fibrillation).  Enzo Bi, MD  08/06/2016 2:10 PM    [facility] [address] [place], [place], [place]  [address] Phone: [phone]; Fax: [phone]

## 2016-08-14 NOTE — Discharge Instructions (Signed)

## 2016-08-14 NOTE — Progress Notes (Signed)
  Echocardiogram Echocardiogram Transesophageal has been performed.  Tye Savoy 08/14/2016, 10:17 AM

## 2016-08-15 ENCOUNTER — Other Ambulatory Visit: Payer: Self-pay | Admitting: Neurology

## 2016-08-15 ENCOUNTER — Telehealth: Payer: Self-pay | Admitting: Pharmacist

## 2016-08-15 MED ORDER — WARFARIN SODIUM 5 MG PO TABS: ORAL_TABLET | ORAL | 0 refills | Status: DC

## 2016-08-15 NOTE — Telephone Encounter (Signed)
Dr. Pearlean Brownie called over from hospital about Coumadin referral. Pt found to have right atrial clot yesterday on outpatient TEE. Dr. Pearlean Brownie would like her to start warfarin and be followed in our coumadin clinic for 6 months of anticoagulation. Have scheduled new Coumadin clinic for Friday. Patient has been seen by Dr. Excell Seltzer in March of this year.   Spoke with patient and advised to start warfarin  daily today. She is unable to come for appt on Monday so will see her Friday in coumadin clinic. Patient states understanding and appreciation.

## 2016-08-17 ENCOUNTER — Encounter (HOSPITAL_COMMUNITY): Payer: Self-pay | Admitting: Cardiovascular Disease

## 2016-08-18 ENCOUNTER — Ambulatory Visit (INDEPENDENT_AMBULATORY_CARE_PROVIDER_SITE_OTHER): Payer: BC Managed Care – PPO

## 2016-08-18 DIAGNOSIS — I6340 Cerebral infarction due to embolism of unspecified cerebral artery: Secondary | ICD-10-CM | POA: Diagnosis not present

## 2016-08-18 DIAGNOSIS — Z5181 Encounter for therapeutic drug level monitoring: Secondary | ICD-10-CM | POA: Diagnosis not present

## 2016-08-18 DIAGNOSIS — I513 Intracardiac thrombosis, not elsewhere classified: Secondary | ICD-10-CM | POA: Diagnosis not present

## 2016-08-18 LAB — POCT INR: INR: 1.4

## 2016-08-18 NOTE — Patient Instructions (Signed)

## 2016-08-21 ENCOUNTER — Other Ambulatory Visit: Payer: Self-pay | Admitting: Neurology

## 2016-08-23 ENCOUNTER — Ambulatory Visit (INDEPENDENT_AMBULATORY_CARE_PROVIDER_SITE_OTHER): Payer: BC Managed Care – PPO | Admitting: *Deleted

## 2016-08-23 DIAGNOSIS — I6340 Cerebral infarction due to embolism of unspecified cerebral artery: Secondary | ICD-10-CM

## 2016-08-23 DIAGNOSIS — Z5181 Encounter for therapeutic drug level monitoring: Secondary | ICD-10-CM | POA: Diagnosis not present

## 2016-08-23 DIAGNOSIS — I513 Intracardiac thrombosis, not elsewhere classified: Secondary | ICD-10-CM

## 2016-08-23 LAB — POCT INR: INR: 2.4

## 2016-08-25 ENCOUNTER — Encounter: Payer: Self-pay | Admitting: Cardiovascular Disease

## 2016-08-25 ENCOUNTER — Other Ambulatory Visit: Payer: Self-pay | Admitting: Family Medicine

## 2016-08-25 ENCOUNTER — Other Ambulatory Visit: Payer: Self-pay

## 2016-08-25 DIAGNOSIS — Z1231 Encounter for screening mammogram for malignant neoplasm of breast: Secondary | ICD-10-CM

## 2016-08-25 MED ORDER — AMOXICILLIN 500 MG PO TABS: ORAL_TABLET | ORAL | 1 refills | Status: AC

## 2016-08-28 ENCOUNTER — Ambulatory Visit (INDEPENDENT_AMBULATORY_CARE_PROVIDER_SITE_OTHER): Payer: BC Managed Care – PPO | Admitting: *Deleted

## 2016-08-28 DIAGNOSIS — I6340 Cerebral infarction due to embolism of unspecified cerebral artery: Secondary | ICD-10-CM | POA: Diagnosis not present

## 2016-08-29 ENCOUNTER — Other Ambulatory Visit: Payer: Self-pay

## 2016-08-29 DIAGNOSIS — I639 Cerebral infarction, unspecified: Secondary | ICD-10-CM

## 2016-08-29 NOTE — Progress Notes (Signed)
Carelink Summary Report / Loop Recorder 

## 2016-08-30 ENCOUNTER — Telehealth: Payer: Self-pay | Admitting: Cardiovascular Disease

## 2016-08-30 NOTE — Telephone Encounter (Signed)
New message      Pt is returning a call to the nurse----probably to give her info on an upcoming CT.  Please call but if vm picks up, ok to leave message on vm.  Pt is at a funeral and waiting to go to the [place]

## 2016-08-30 NOTE — Telephone Encounter (Signed)
UNABLE TO LEAVE MESSAGE  MAIL BOX FULL ./CY

## 2016-08-31 ENCOUNTER — Ambulatory Visit (INDEPENDENT_AMBULATORY_CARE_PROVIDER_SITE_OTHER): Payer: BC Managed Care – PPO | Admitting: *Deleted

## 2016-08-31 DIAGNOSIS — I6340 Cerebral infarction due to embolism of unspecified cerebral artery: Secondary | ICD-10-CM | POA: Diagnosis not present

## 2016-08-31 DIAGNOSIS — I513 Intracardiac thrombosis, not elsewhere classified: Secondary | ICD-10-CM | POA: Diagnosis not present

## 2016-08-31 DIAGNOSIS — Z5181 Encounter for therapeutic drug level monitoring: Secondary | ICD-10-CM | POA: Diagnosis not present

## 2016-08-31 LAB — POCT INR: INR: 2.2

## 2016-08-31 NOTE — Telephone Encounter (Signed)
Reviewed chart and the pt has been scheduled for CTA of Chest on 09/06/2016.

## 2016-09-06 ENCOUNTER — Ambulatory Visit (INDEPENDENT_AMBULATORY_CARE_PROVIDER_SITE_OTHER)
Admission: RE | Admit: 2016-09-06 | Discharge: 2016-09-06 | Disposition: A | Discharge status: Home / Self Care | Payer: BC Managed Care – PPO | Source: Ambulatory Visit | Attending: Cardiovascular Disease | Admitting: Cardiovascular Disease

## 2016-09-06 ENCOUNTER — Ambulatory Visit (INDEPENDENT_AMBULATORY_CARE_PROVIDER_SITE_OTHER): Payer: BC Managed Care – PPO | Admitting: *Deleted

## 2016-09-06 DIAGNOSIS — I513 Intracardiac thrombosis, not elsewhere classified: Secondary | ICD-10-CM

## 2016-09-06 DIAGNOSIS — I639 Cerebral infarction, unspecified: Secondary | ICD-10-CM | POA: Diagnosis not present

## 2016-09-06 DIAGNOSIS — I6340 Cerebral infarction due to embolism of unspecified cerebral artery: Secondary | ICD-10-CM

## 2016-09-06 DIAGNOSIS — Z5181 Encounter for therapeutic drug level monitoring: Secondary | ICD-10-CM

## 2016-09-06 LAB — POCT INR: INR: 2.2

## 2016-09-06 MED ORDER — IOPAMIDOL (ISOVUE-370) INJECTION 76%
100.0000 mL | Freq: Once | INTRAVENOUS | Status: AC | PRN
  Administered 2016-09-06: 100 mL via INTRAVENOUS

## 2016-09-08 ENCOUNTER — Encounter (HOSPITAL_COMMUNITY): Payer: Self-pay | Admitting: Emergency Medicine

## 2016-09-08 ENCOUNTER — Emergency Department (HOSPITAL_COMMUNITY)
Admission: EM | Admit: 2016-09-08 | Discharge: 2016-09-08 | Disposition: A | Discharge status: Home / Self Care | Payer: BC Managed Care – PPO | Attending: Emergency Medicine | Admitting: Emergency Medicine

## 2016-09-08 ENCOUNTER — Emergency Department (HOSPITAL_COMMUNITY): Payer: BC Managed Care – PPO

## 2016-09-08 DIAGNOSIS — Z8673 Personal history of transient ischemic attack (TIA), and cerebral infarction without residual deficits: Secondary | ICD-10-CM | POA: Diagnosis not present

## 2016-09-08 DIAGNOSIS — I120 Hypertensive chronic kidney disease with stage 5 chronic kidney disease or end stage renal disease: Secondary | ICD-10-CM | POA: Insufficient documentation

## 2016-09-08 DIAGNOSIS — R42 Dizziness and giddiness: Secondary | ICD-10-CM

## 2016-09-08 DIAGNOSIS — N186 End stage renal disease: Secondary | ICD-10-CM | POA: Insufficient documentation

## 2016-09-08 DIAGNOSIS — Z7901 Long term (current) use of anticoagulants: Secondary | ICD-10-CM | POA: Diagnosis not present

## 2016-09-08 DIAGNOSIS — Z7982 Long term (current) use of aspirin: Secondary | ICD-10-CM | POA: Insufficient documentation

## 2016-09-08 DIAGNOSIS — Z992 Dependence on renal dialysis: Secondary | ICD-10-CM | POA: Insufficient documentation

## 2016-09-08 LAB — COMPREHENSIVE METABOLIC PANEL
ALBUMIN: 3.2 g/dL — AB (ref 3.5–5.0)
ALK PHOS: 78 U/L (ref 38–126)
ALT: 15 U/L (ref 14–54)
AST: 19 U/L (ref 15–41)
Anion gap: 14 (ref 5–15)
BILIRUBIN TOTAL: 0.3 mg/dL (ref 0.3–1.2)
BUN: 51 mg/dL — AB (ref 6–20)
CALCIUM: 9.1 mg/dL (ref 8.9–10.3)
CO2: 23 mmol/L (ref 22–32)
CREATININE: 16.21 mg/dL — AB (ref 0.44–1.00)
Chloride: 95 mmol/L — ABNORMAL LOW (ref 101–111)
GFR calc Af Amer: 3 mL/min — ABNORMAL LOW (ref >60)
GFR calc non Af Amer: 2 mL/min — ABNORMAL LOW (ref >60)
Glucose, Bld: 101 mg/dL — ABNORMAL HIGH (ref 65–99)
Potassium: 4.1 mmol/L (ref 3.5–5.1)
Sodium: 132 mmol/L — ABNORMAL LOW (ref 135–145)
TOTAL PROTEIN: 8 g/dL (ref 6.5–8.1)

## 2016-09-08 LAB — DIFFERENTIAL
BASOS ABS: 0 10*3/uL (ref 0.0–0.1)
Basophils Relative: 1 %
Eosinophils Absolute: 0.3 10*3/uL (ref 0.0–0.7)
Eosinophils Relative: 4 %
LYMPHS ABS: 1.4 10*3/uL (ref 0.7–4.0)
LYMPHS PCT: 23 %
Monocytes Absolute: 0.5 10*3/uL (ref 0.1–1.0)
Monocytes Relative: 9 %
NEUTROS PCT: 63 %
Neutro Abs: 3.8 10*3/uL (ref 1.7–7.7)

## 2016-09-08 LAB — I-STAT CHEM 8, ED
BUN: 50 mg/dL — AB (ref 6–20)
CALCIUM ION: 1.06 mmol/L — AB (ref 1.15–1.40)
CHLORIDE: 98 mmol/L — AB (ref 101–111)
Creatinine, Ser: 17.2 mg/dL — ABNORMAL HIGH (ref 0.44–1.00)
Glucose, Bld: 101 mg/dL — ABNORMAL HIGH (ref 65–99)
HCT: 33 % — ABNORMAL LOW (ref 36.0–46.0)
Hemoglobin: 11.2 g/dL — ABNORMAL LOW (ref 12.0–15.0)
Potassium: 4.2 mmol/L (ref 3.5–5.1)
Sodium: 134 mmol/L — ABNORMAL LOW (ref 135–145)
TCO2: 25 mmol/L (ref 0–100)

## 2016-09-08 LAB — I-STAT TROPONIN, ED: Troponin i, poc: 0.01 ng/mL (ref 0.00–0.08)

## 2016-09-08 LAB — CBC
HCT: 32.1 % — ABNORMAL LOW (ref 36.0–46.0)
HEMOGLOBIN: 10.8 g/dL — AB (ref 12.0–15.0)
MCH: 35.2 pg — AB (ref 26.0–34.0)
MCHC: 33.6 g/dL (ref 30.0–36.0)
MCV: 104.6 fL — ABNORMAL HIGH (ref 78.0–100.0)
Platelets: 255 10*3/uL (ref 150–400)
RBC: 3.07 MIL/uL — AB (ref 3.87–5.11)
RDW: 14.4 % (ref 11.5–15.5)
WBC: 6.1 10*3/uL (ref 4.0–10.5)

## 2016-09-08 LAB — PROTIME-INR
INR: 2.08
Prothrombin Time: 23.7 seconds — ABNORMAL HIGH (ref 11.4–15.2)

## 2016-09-08 LAB — CBG MONITORING, ED: Glucose-Capillary: 100 mg/dL — ABNORMAL HIGH (ref 65–99)

## 2016-09-08 LAB — APTT: APTT: 36 s (ref 24–36)

## 2016-09-08 NOTE — ED Notes (Signed)
Sanders, PA at bedside.  

## 2016-09-08 NOTE — ED Notes (Signed)
Pt home stable with steady gait after she states she understands instructions. 

## 2016-09-08 NOTE — ED Notes (Signed)
Monitor removed at pt request. Pt states she is going home.

## 2016-09-08 NOTE — ED Triage Notes (Addendum)
Pt from home with c/o intermittent dizziness with turning her head starting yesterday.  Pt reports this morning, approx 30 minutes ago, she felt as though she was not able to control her pointing direction of the right arm with increased weakness.  Pt reports mild sensory changes of right arm at this time.  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  NAD, A&O.

## 2016-09-08 NOTE — Discharge Instructions (Signed)
As we discussed, your CT today did not show an acute stroke.  We have talked with our neurology team who did not recommend further work-up at this time as you are already on appropriate treatment. Continue your coumadin as prescribed. Follow-up with neurology as scheduled.  Also recommend to follow-up with your primary care doctor. Return to the ED for new or worsening symptoms--- focal numbness, weakness, confusion, changes in speech, trouble walking, visual changes, etc.

## 2016-09-08 NOTE — ED Provider Notes (Signed)
[facility] DEPT Provider Note   CSN: [id number] Arrival date & time: 09/08/16  0732     History   Chief Complaint Chief Complaint  Patient presents with  . Dizziness  . Extremity Weakness    HPI Elizabeth Anthony is a 46 y.o. female.  The history is provided by the patient and medical records.  Dizziness  Extremity Weakness     46 y.o. F with hx of seasonal allergies, anemia, anxiety, HTN, migraine headaches, ESRD on peritoneal dialysis M/W/F, sleep apnea, presenting to the ED for dizziness which began yesterday.  States this has been intermittent, mostly when turning her head to the side or moving.  States about 30 minutes prior to arrival she was driving to work and felt some "odd sensations" in her right arm.  States she felt she was having some issues with coordination and some weakness in her right arm.  States it feels better at this time.  She denies any headache or dizziness at present.  No confusion, slurred speech, visual changes, trouble walking, word finding difficulties, numbness, weakness of the extremities.  Does report hx of 3 small strokes in the past-- states these were found after the fact and she became concerned so wanted to be sure it wasn't a stroke today.  Patient is on coumadin.  Past Medical History:  Diagnosis Date  . Allergy   . Anemia   . Anxiety   . Hypertension   . Migraine   . PONV (postoperative nausea and vomiting)   . Renal disorder    Dialysis M/W/F  . Sleep apnea    uses cpap    Patient Active Problem List   Diagnosis Date Noted  . 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 Encounter for therapeutic drug monitoring 08/18/2016  . PD catheter dysfunction ([place])   . PFO (patent foramen ovale)   . Stroke (cerebrum) ([place]) 07/27/2016  . ESRD on peritoneal dialysis ([place]) 07/27/2016  . Hypercalcemia 07/27/2016  . Thrombocytopenia ([place]) 07/27/2016  . History of anemia due to CKD 07/27/2016  . Episodes of  speech arrest   . TIA (transient ischemic attack)   . Insomnia 09/17/2015  . Muscle spasm 07/07/2015  . Acute anxiety 07/07/2015  . Abnormal EKG 01/26/2015  . Sleep apnea 01/26/2015  . 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 Umbilical hernia without obstruction or gangrene 01/26/2015  . Cervicalgia 01/26/2015  . Pain in joint, forearm 01/26/2015  . Papanicolaou smear of cervix with low grade squamous intraepithelial lesion (LGSIL) 01/26/2015  . Premenopausal menorrhagia 01/26/2015  . Irregular menses 01/26/2015  . Benign hypertension with CKD (chronic kidney disease) stage V ([place]) 01/26/2015  . Kidney disease, chronic, stage V (end stage, EGFR < 15 ml/min) ([place]) 01/26/2015  . Fatigue 01/26/2015  . Skin photosensitivity 01/26/2015  . Rhinitis, allergic 01/26/2015  . Calf pain 01/26/2015  . Piriformis muscle pain 01/26/2015  . Anemia in CKD (chronic kidney disease) 01/26/2015  . Knee pain 01/26/2015  . Vitamin D deficiency 01/26/2015    Past Surgical History:  Procedure Laterality Date  . BASCILIC VEIN TRANSPOSITION Left 09/30/2015   Procedure: LEFT ARM FIRST STAGE BASILIC VEIN TRANSPOSITION;  Surgeon: Serafina Mitchell, MD;  Location: [facility];  Service: Vascular;  Laterality: Left;  . BASCILIC VEIN TRANSPOSITION Left 11/30/2015   Procedure: SECOND STAGE BASILIC VEIN TRANSPOSITION, left arm;  Surgeon: Serafina Mitchell, MD;  Location: [facility];  Service: Vascular;  Laterality: Left;  . BREAST REDUCTION SURGERY Bilateral   . insertion hemodialysis  catheter    . LOOP RECORDER INSERTION N/A 07/28/2016   Procedure: Loop Recorder Insertion;  Surgeon: Thompson Grayer, MD;  Location: [facility];  Service: Cardiovascular;  Laterality: N/A;  . REDUCTION MAMMAPLASTY  1998  . TEE WITHOUT CARDIOVERSION N/A 08/14/2016   Procedure: TRANSESOPHAGEAL ECHOCARDIOGRAM (TEE);  Surgeon: Sanda Klein, MD;  Location: [facility] ENDOSCOPY;  Service: Cardiovascular;  Laterality: N/A;    OB History    No data available        Home Medications    Prior to Admission medications   Medication Sig Start Date End Date Taking? Authorizing Provider  ALPRAZolam Duanne Moron) 0.5 MG tablet Take 1 tablet (0.5 mg total) by mouth 2 (two) times daily as needed for anxiety. 08/14/16   Richard Maceo Pro., MD  amoxicillin (AMOXIL) 500 MG tablet Take 4 tablets by mouth one hour prior to dental appointment 08/25/16   Sherren Mocha, MD  aspirin EC 81 MG EC tablet Take 1 tablet (81 mg total) by mouth daily. 07/30/16   Shela Leff, MD  calcitRIOL (ROCALTROL) 0.5 MCG capsule Take 0.5 mcg by mouth daily. 2 capsules twice daily    Historical Provider, MD  calcium acetate (PHOSLO) 667 MG capsule Take 667 mg by mouth 4 (four) times daily.    Historical Provider, MD  cetirizine (ZYRTEC) 10 MG tablet Take 5 mg by mouth daily.    Historical Provider, MD  gentamicin cream (GARAMYCIN) 0.1 % Apply 1 application topically.  05/11/16   Historical Provider, MD  multivitamin (RENA-VIT) TABS tablet Take 1 tablet by mouth at bedtime. 08/31/15   Historical Provider, MD  NON FORMULARY Dialysis. AT home 7 days a week    Historical Provider, MD  sevelamer carbonate (RENVELA) 800 MG tablet Take 800-2,400 mg by mouth See admin instructions. Take 2400 mg by mouth 3 times daily with meals. Take 800 mg by mouth with snacks.    Historical Provider, MD  warfarin (COUMADIN) 5 MG tablet Take 1 tablet daily or as directed by Coumadin clinic 08/15/16   Sherren Mocha, MD    Family History Family History  Problem Relation Age of Onset  . Cancer Mother     breast   . Kidney disease Mother   . Breast cancer Mother 28    Social History Social History  Substance Use Topics  . Smoking status: Never Smoker  . Smokeless tobacco: Never Used  . Alcohol use No     Allergies   Ace inhibitors; Dye fdc red [red dye]; Nsaids; Vancomycin; Alcohol-sulfur [sulfur]; and Alcohol   Review of Systems Review of Systems  Musculoskeletal: Positive for extremity  weakness.  Neurological: Positive for dizziness.  All other systems reviewed and are negative.    Physical Exam Updated Vital Signs BP 111/83   Pulse 99   Resp 12   Ht 5' 3.5" (1.613 m)   Wt 86.2 kg   LMP 08/09/2016 (Exact Date)   SpO2 100%   BMI 33.13 kg/m   Physical Exam  Constitutional: She is oriented to person, place, and time. She appears well-developed and well-nourished.  HENT:  Head: Normocephalic and atraumatic.  Mouth/Throat: Oropharynx is clear and moist.  Eyes: Conjunctivae and EOM are normal. Pupils are equal, round, and reactive to light.  Pupils symmetric and reactive bilaterally, EOMs fully intact, normal confrontation, no apparent field cuts  Neck: Normal range of motion.  Cardiovascular: Normal rate, regular rhythm and normal heart sounds.   Pulmonary/Chest: Effort normal and breath sounds normal. No respiratory distress.  She has no wheezes.  Abdominal: Soft. Bowel sounds are normal. There is no tenderness. There is no rebound.  Musculoskeletal: Normal range of motion.  Neurological: She is alert and oriented to person, place, and time.  AAOx3, answering questions and following commands appropriately; equal strength UE and LE bilaterally; CN grossly intact; moves all extremities appropriately without ataxia; normal finger to nose bilaterally, normal rapid alternating hand movements, normal heel to shin bilaterally, speech is clear; no apparent word finding difficulties  Skin: Skin is warm and dry.  Psychiatric: She has a normal mood and affect.  Nursing note and vitals reviewed.    ED Treatments / Results  Labs (all labs ordered are listed, but only abnormal results are displayed) Labs Reviewed  PROTIME-INR - Abnormal; Notable for the following:       Result Value   Prothrombin Time 23.7 (*)    All other components within normal limits  CBC - Abnormal; Notable for the following:    RBC 3.07 (*)    Hemoglobin 10.8 (*)    HCT 32.1 (*)    MCV 104.6  (*)    MCH 35.2 (*)    All other components within normal limits  COMPREHENSIVE METABOLIC PANEL - Abnormal; Notable for the following:    Sodium 132 (*)    Chloride 95 (*)    Glucose, Bld 101 (*)    BUN 51 (*)    Creatinine, Ser 16.21 (*)    Albumin 3.2 (*)    GFR calc non Af Amer 2 (*)    GFR calc Af Amer 3 (*)    All other components within normal limits  CBG MONITORING, ED - Abnormal; Notable for the following:    Glucose-Capillary 100 (*)    All other components within normal limits  I-STAT CHEM 8, ED - Abnormal; Notable for the following:    Sodium 134 (*)    Chloride 98 (*)    BUN 50 (*)    Creatinine, Ser 17.20 (*)    Glucose, Bld 101 (*)    Calcium, Ion 1.06 (*)    Hemoglobin 11.2 (*)    HCT 33.0 (*)    All other components within normal limits  APTT  DIFFERENTIAL  I-STAT TROPOININ, ED    EKG  EKG Interpretation  Date/Time:  Friday September 08 2016 07:41:04 EDT Ventricular Rate:  99 PR Interval:    QRS Duration: 76 QT Interval:  364 QTC Calculation: 468 R Axis:   10 Text Interpretation:  Sinus rhythm Low voltage, precordial leads RSR' in V1 or V2, right VCD or RVH Confirmed by Jeneen Rinks  MD, [place] ([id number]) on 09/08/2016 8:56:24 AM       Radiology Ct Head Wo Contrast  Result Date: 09/08/2016 CLINICAL DATA:  Dizziness.  Transient right arm weakness. EXAM: CT HEAD WITHOUT CONTRAST TECHNIQUE: Contiguous axial images were obtained from the base of the skull through the vertex without intravenous contrast. COMPARISON:  07/27/2016 head CT and MRI FINDINGS: Brain: There is a new small region of cortical and subcortical hypoattenuation in the left parietal lobe corresponding to the infarct demonstrated on prior MRI, now all more late subacute to chronic in appearance. There is a small, more subtle focus of low attenuation in the posterior right frontal lobe also corresponding to the more acute appearing infarct on the prior MRI. No definite new infarct is identified. There is no  evidence of acute intracranial hemorrhage, mass, midline shift, or extra-axial fluid collection. The ventricles are normal. Vascular: Mild  calcified atherosclerosis at the skullbase. Slightly dense appearance of the ICA termini, MCAs, an ACAs bilaterally without significant asymmetry. Skull: No fracture or focal osseous lesion. Sinuses/Orbits: Visualized paranasal sinuses and mastoid air cells are clear. Orbits are unremarkable. Other: None. IMPRESSION: 1. No evidence of acute intracranial abnormality. 2. Subacute right frontal and left parietal infarcts as seen on last month's MRI. Electronically Signed   By: Logan Bores M.D.   On: 09/08/2016 09:22    Procedures Procedures (including critical care time)  Medications Ordered in ED Medications - No data to display   Initial Impression / Assessment and Plan / ED Course  I have reviewed the triage vital signs and the nursing notes.  Pertinent labs & imaging results that were available during my care of the patient were reviewed by me and considered in my medical decision making (see chart for details).  46 year old female here with dizziness and right arm symptoms.  Dizziness started yesterday, has been intermittent.  Reports some trouble with movement of the right arm about 30 mins ago.  Symptoms resolved by time of my evaluation.  Her neurologic exam is non-focal here.  Patient hospitalized for stroke last month, started on coumadin at that time.  As all symptoms have resolved at this time, code stroke was not activated.  Will plan for stroke work-up and neurology consultation.  Lab work consistent with prior given her PD status.  K+ is normal.  Trop negative.  CT head without acute findings-- noted old strokes.  I have discussed case with Dr. Leonel Ramsay of neurology--- has reviewed her labs/imaging studies from today as well as notes from prior stroke admission.  Does not feel MRI today will likely change her management as if there is a small  infarct patient is asymptomatic of this and is already appropriately anti-coagulated with very recent full stroke work-up including cardiology evaluation.  If patient was uncomfortable recommended we could offer observation admission for close monitoring.   On re-check, patient remains asymptomatic with stable VS.  I have discussed options with patient, she would actually prefer to go home and is comfortable with this care plan.  She will continue her coumadin.  Has previously scheduled follow-up with outpatient neurology.  Discussed plan with patient, he/she acknowledged understanding and agreed with plan of care.  Return precautions given for new or worsening symptoms.  Case discussed with attending physician, Dr. Jeneen Rinks, who agrees with assessment and plan of care.  Final Clinical Impressions(s) / ED Diagnoses   Final diagnoses:  Episode of dizziness    New Prescriptions Discharge Medication List as of 09/08/2016 10:26 AM       Larene Pickett, PA-C 09/08/16 1250    Tanna Furry, MD 09/24/16 [phone]

## 2016-09-10 LAB — CUP PACEART REMOTE DEVICE CHECK
Date Time Interrogation Session: 20180415204117
MDC IDC PG IMPLANT DT: 20180316

## 2016-09-10 NOTE — Progress Notes (Signed)
Carelink summary report received. Battery status OK. Normal device function. No new symptom episodes, tachy episodes, brady, or pause episodes. No new AF episodes. Monthly summary reports and ROV/PRN 

## 2016-09-11 ENCOUNTER — Other Ambulatory Visit: Payer: Self-pay | Admitting: Cardiovascular Disease

## 2016-09-13 ENCOUNTER — Ambulatory Visit
Admission: RE | Admit: 2016-09-13 | Discharge: 2016-09-13 | Disposition: A | Discharge status: Home / Self Care | Payer: BC Managed Care – PPO | Source: Ambulatory Visit | Attending: Family Medicine | Admitting: Family Medicine

## 2016-09-13 DIAGNOSIS — Z1231 Encounter for screening mammogram for malignant neoplasm of breast: Secondary | ICD-10-CM | POA: Diagnosis present

## 2016-09-18 ENCOUNTER — Other Ambulatory Visit: Payer: Self-pay | Admitting: Internal Medicine

## 2016-09-20 ENCOUNTER — Ambulatory Visit (INDEPENDENT_AMBULATORY_CARE_PROVIDER_SITE_OTHER): Payer: BC Managed Care – PPO | Admitting: *Deleted

## 2016-09-20 DIAGNOSIS — I6340 Cerebral infarction due to embolism of unspecified cerebral artery: Secondary | ICD-10-CM

## 2016-09-20 DIAGNOSIS — Z5181 Encounter for therapeutic drug level monitoring: Secondary | ICD-10-CM

## 2016-09-20 DIAGNOSIS — I513 Intracardiac thrombosis, not elsewhere classified: Secondary | ICD-10-CM | POA: Diagnosis not present

## 2016-09-20 LAB — POCT INR: INR: 2.5

## 2016-09-26 ENCOUNTER — Ambulatory Visit (INDEPENDENT_AMBULATORY_CARE_PROVIDER_SITE_OTHER): Payer: BC Managed Care – PPO | Admitting: *Deleted

## 2016-09-26 DIAGNOSIS — I6340 Cerebral infarction due to embolism of unspecified cerebral artery: Secondary | ICD-10-CM

## 2016-09-27 NOTE — Progress Notes (Signed)
Carelink Summary Report / Loop Recorder 

## 2016-10-07 LAB — CUP PACEART REMOTE DEVICE CHECK
Date Time Interrogation Session: 20180515204059
MDC IDC PG IMPLANT DT: 20180316

## 2016-10-07 NOTE — Progress Notes (Signed)
Carelink summary report received. Battery status OK. Normal device function. No new symptom episodes, tachy episodes, brady, or pause episodes. No new AF episodes. Histogram appears HR >100 BPM most of the time. Monthly summary reports and ROV/PRN

## 2016-10-11 ENCOUNTER — Ambulatory Visit: Payer: BC Managed Care – PPO | Admitting: Neurology

## 2016-10-16 ENCOUNTER — Ambulatory Visit (INDEPENDENT_AMBULATORY_CARE_PROVIDER_SITE_OTHER): Payer: BC Managed Care – PPO | Admitting: *Deleted

## 2016-10-16 DIAGNOSIS — I513 Intracardiac thrombosis, not elsewhere classified: Secondary | ICD-10-CM | POA: Diagnosis not present

## 2016-10-16 DIAGNOSIS — Z5181 Encounter for therapeutic drug level monitoring: Secondary | ICD-10-CM | POA: Diagnosis not present

## 2016-10-16 DIAGNOSIS — I6340 Cerebral infarction due to embolism of unspecified cerebral artery: Secondary | ICD-10-CM | POA: Diagnosis not present

## 2016-10-16 LAB — POCT INR: INR: 1.1

## 2016-10-23 ENCOUNTER — Ambulatory Visit (INDEPENDENT_AMBULATORY_CARE_PROVIDER_SITE_OTHER): Payer: BC Managed Care – PPO

## 2016-10-23 DIAGNOSIS — I513 Intracardiac thrombosis, not elsewhere classified: Secondary | ICD-10-CM | POA: Diagnosis not present

## 2016-10-23 DIAGNOSIS — I6340 Cerebral infarction due to embolism of unspecified cerebral artery: Secondary | ICD-10-CM | POA: Diagnosis not present

## 2016-10-23 DIAGNOSIS — Z5181 Encounter for therapeutic drug level monitoring: Secondary | ICD-10-CM

## 2016-10-23 LAB — POCT INR: INR: 1.8

## 2016-10-26 ENCOUNTER — Ambulatory Visit (INDEPENDENT_AMBULATORY_CARE_PROVIDER_SITE_OTHER): Payer: BC Managed Care – PPO | Admitting: *Deleted

## 2016-10-26 DIAGNOSIS — I6340 Cerebral infarction due to embolism of unspecified cerebral artery: Secondary | ICD-10-CM | POA: Diagnosis not present

## 2016-10-27 NOTE — Progress Notes (Signed)
Carelink Summary Report / Loop Recorder 

## 2016-11-06 ENCOUNTER — Ambulatory Visit (INDEPENDENT_AMBULATORY_CARE_PROVIDER_SITE_OTHER): Payer: BC Managed Care – PPO | Admitting: *Deleted

## 2016-11-06 DIAGNOSIS — I6340 Cerebral infarction due to embolism of unspecified cerebral artery: Secondary | ICD-10-CM

## 2016-11-06 DIAGNOSIS — I513 Intracardiac thrombosis, not elsewhere classified: Secondary | ICD-10-CM

## 2016-11-06 DIAGNOSIS — Z5181 Encounter for therapeutic drug level monitoring: Secondary | ICD-10-CM | POA: Diagnosis not present

## 2016-11-06 LAB — CUP PACEART REMOTE DEVICE CHECK
Implantable Pulse Generator Implant Date: 20180316
MDC IDC SESS DTM: 20180614204107

## 2016-11-06 LAB — POCT INR: INR: 1.6

## 2016-11-06 NOTE — Progress Notes (Signed)
Carelink summary report received. Battery status OK. Normal device function. No new symptom episodes, tachy episodes, brady, or pause episodes. No new AF episodes. Monthly summary reports and ROV/PRN 

## 2016-11-16 ENCOUNTER — Ambulatory Visit (INDEPENDENT_AMBULATORY_CARE_PROVIDER_SITE_OTHER): Payer: BC Managed Care – PPO | Admitting: *Deleted

## 2016-11-16 DIAGNOSIS — Z5181 Encounter for therapeutic drug level monitoring: Secondary | ICD-10-CM

## 2016-11-16 DIAGNOSIS — I6340 Cerebral infarction due to embolism of unspecified cerebral artery: Secondary | ICD-10-CM

## 2016-11-16 DIAGNOSIS — I513 Intracardiac thrombosis, not elsewhere classified: Secondary | ICD-10-CM

## 2016-11-16 LAB — POCT INR: INR: 1.8

## 2016-11-21 ENCOUNTER — Encounter: Payer: Self-pay | Admitting: Neurology

## 2016-11-21 ENCOUNTER — Ambulatory Visit (INDEPENDENT_AMBULATORY_CARE_PROVIDER_SITE_OTHER): Payer: BC Managed Care – PPO | Admitting: Neurology

## 2016-11-21 VITALS — BP 96/70 | HR 113 | Ht 64.0 in | Wt 190.6 lb

## 2016-11-21 DIAGNOSIS — D696 Thrombocytopenia, unspecified: Secondary | ICD-10-CM | POA: Diagnosis not present

## 2016-11-21 DIAGNOSIS — Q2112 Patent foramen ovale: Secondary | ICD-10-CM

## 2016-11-21 DIAGNOSIS — I9589 Other hypotension: Secondary | ICD-10-CM

## 2016-11-21 DIAGNOSIS — N186 End stage renal disease: Secondary | ICD-10-CM

## 2016-11-21 DIAGNOSIS — Z992 Dependence on renal dialysis: Secondary | ICD-10-CM | POA: Diagnosis not present

## 2016-11-21 DIAGNOSIS — I6340 Cerebral infarction due to embolism of unspecified cerebral artery: Secondary | ICD-10-CM

## 2016-11-21 DIAGNOSIS — Q211 Atrial septal defect: Secondary | ICD-10-CM | POA: Diagnosis not present

## 2016-11-21 DIAGNOSIS — I959 Hypotension, unspecified: Secondary | ICD-10-CM | POA: Insufficient documentation

## 2016-11-21 NOTE — Progress Notes (Signed)
STROKE NEUROLOGY FOLLOW UP NOTE  NAME: Elizabeth Anthony DOB: 1970-11-27  REASON FOR VISIT: stroke follow up HISTORY FROM: pt and chart  Today we had the pleasure of seeing Elizabeth Anthony in follow-up at our Neurology Clinic. Pt was accompanied by no one.   History Summary Elizabeth Anthony is a 46 y.o. female with history of OSA, ESRD on peritoneal dialysis, migraine headaches, HTN, and anxiety admitted on 07/27/16 for transient speech difficulties. She did not receive IV t-PA due to resolution of deficits. 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 MRA - normal, CTA H&N unremarkable, no DVT but TCD bubble study indicated large PFO. 2D Echo with EF 65-70%. Loop recorder placed. Due to thrombocytopenia, she was discharged with ASA 81 and recommended outpt TEE and possible PFO closure with Dr. Excell Seltzer.   However, outpt TEE done on 08/14/16 showed no PFO but right atrial thrombus. She was put on coumadin, INR 2-3. However, ASA was continued. Loop recorder no afib. Due to the discrepancy of TEE and TCD regarding PFO detection, CTA chest done and ruled out pulmonary AVM. She had transient episode of dizziness and right arm weakness on 09/08/16, went to ED and CT head negative for acute changes.   Interval History During the interval time, the patient has been doing well. No stroke like symptoms. However, she complains of dizziness especially standing up or stand too long. Her BP was low at 96/70. She admitted that her BP at home was also low. She continues to do PD daily at home. Her last INR 1.9. She has appointment with Dr. Excell Seltzer to repeat TEE for evaluation of right atrial thrombus and PFO.     REVIEW OF SYSTEMS: Full 14 system review of systems performed and notable only for those listed below and in HPI above, all others are negative:  Constitutional:  fatigue Cardiovascular:  Ear/Nose/Throat:   Skin: rash, itching Eyes:     Respiratory:   Gastroitestinal:   Genitourinary:  Hematology/Lymphatic:   Endocrine:  Musculoskeletal:   Allergy/Immunology:   Neurological:  Dizziness, pass out Psychiatric:  Sleep:   The following represents the patient's updated allergies and side effects list: Allergies  Allergen Reactions  . Ace Inhibitors Other (See Comments)    Uncontrollable Coughing & Choking  . Dye Fdc Red [Red Dye] Other (See Comments)    Kidney failure  . Nsaids Other (See Comments)    Shoots creatine up  . Vancomycin Swelling, Rash and Other (See Comments)    Entire skin peeled, sores in throat  . Alcohol-Sulfur [Sulfur] Other (See Comments)    Itching, numbness of mouth  . Alcohol Itching and Other (See Comments)    Hard liquor (Vodka)    The neurologically relevant items on the patient's problem list were reviewed on today's visit.  Neurologic Examination  A problem focused neurological exam (12 or more points of the single system neurologic examination, vital signs counts as 1 point, cranial nerves count for 8 points) was performed.  Blood pressure 96/70, pulse (!) 113, height 5\' 4"  (1.626 m), weight 190 lb 9.6 oz (86.5 kg).  General - obese, well developed, in no apparent distress.  Ophthalmologic - Sharp disc margins OU.   Cardiovascular - Regular rate and rhythm with no murmur.  Mental Status -  Level of arousal and orientation to time, place, and person were intact. Language including expression, naming, repetition, comprehension was assessed and found intact. Attention span and concentration were normal. Fund of  Knowledge was assessed and was intact.  Cranial Nerves II - XII - II - Visual field intact OU. III, IV, VI - Extraocular movements intact. V - Facial sensation intact bilaterally. VII - Facial movement intact bilaterally. VIII - Hearing & vestibular intact bilaterally. X - Palate elevates symmetrically. XI - Chin turning & shoulder shrug intact bilaterally. XII -  Tongue protrusion intact.  Motor Strength - The patient's strength was normal in all extremities and pronator drift was absent.  Bulk was normal and fasciculations were absent.   Motor Tone - Muscle tone was assessed at the neck and appendages and was normal.  Reflexes - The patient's reflexes were 1+ in all extremities and she had no pathological reflexes.  Sensory - Light touch, temperature/pinprick, vibration and proprioception, and Romberg testing were assessed and were normal.    Coordination - The patient had normal movements in the hands and feet with no ataxia or dysmetria.  Tremor was absent.  Gait and Station - The patient's transfers, posture, gait, station, and turns were observed as normal.   Functional score  mRS = 1   0 - No symptoms.   1 - No significant disability. Able to carry out all usual activities, despite some symptoms.   2 - Slight disability. Able to look after own affairs without assistance, but unable to carry out all previous activities.   3 - Moderate disability. Requires some help, but able to walk unassisted.   4 - Moderately severe disability. Unable to attend to own bodily needs without assistance, and unable to walk unassisted.   5 - Severe disability. Requires constant nursing care and attention, bedridden, incontinent.   6 - Dead.   NIH Stroke Scale = 0   Data reviewed: I personally reviewed the images and agree with the radiology interpretations.  Ct Angio Head W Or Wo Contrast 07/27/2016 CT HEAD:   Negative (patient's known infarcts not evident by CT).  CTA HEAD:  No emergent large vessel occlusion or severe stenosis. Mild intracranial atherosclerosis. CTA NECK:  No hemodynamically significant stenosis or acute vascular process. Mild changes of chronic hypertension.   Dg Abd 1 View 07/28/2016 Peritoneal dialysis catheter coils in the midline of the pelvis. No acute findings.   Mr Maxine Glenn Head Wo Contrast 07/27/2016 1. Small area  of low diffusion consistent with acute infarction in right posterior frontal lobe.  2. Small left superior parietal cortical T2 FLAIR hyperintense focus with mildly diminished diffusion, distinct from the right frontal infarct, probably a subacute infarction.  3. Punctate left superior cerebellar, likely acute infarct.  4. No intracranial hemorrhage identified.  5. Normal MRA of the head. No evidence of high-grade stenosis, large vessel occlusion, or aneurysm.   Ct Head Code Stroke W/o Cm 07/27/2016 1. No acute intracranial abnormality identified. Unremarkable CT of the head.  2. ASPECTS is 10   LE venous doppler - no DVT  TCD bubble study - spencer degree IV at rest, but full curtain effect on valsalva.  TTE - Normal LV size with EF 65-70%, mild LV hypertrophy. Normal RV size and systolic function. No significant valvular abnormalities.  TEE 08/14/16 - Left ventricle: Systolic function was normal. The estimated   ejection fraction was in the range of 60% to 65%. Wall motion was   normal; there were no regional wall motion abnormalities. - Aortic valve: No evidence of vegetation. - Mitral valve: No evidence of vegetation. - Left atrium: No evidence of thrombus in the atrial cavity or  appendage. No spontaneous echo contrast was observed. - Right atrium: The atrium was normal in size. A large fixed mass   (probably organized thrombus) is seen in the cavity. A mobile   very bright (metallic?) filament is attached to the mass. - Atrial septum: No defect or patent foramen ovale was identified.   Echo contrast study showed no right-to-left atrial level shunt,   at baseline or with provocation. Very late after one injection, a   few saline bubbles are seen in the ascending aorta. Pulmonary AV   shunting is suspected. - Tricuspid valve: No evidence of vegetation. - Systemic veins: The superior vena cava is widely patent, but is   circumferentially thickened up to 7 mm wall  thickness (consider   organized thrombus).  CT head 09/08/16 1. No evidence of acute intracranial abnormality. 2. Subacute right frontal and left parietal infarcts as seen on last month's MRI.  Component     Latest Ref Rng & Units 07/27/2016 07/28/2016  Cholesterol     0 - 200 mg/dL 161124   Triglycerides     <150 mg/dL 096249 (H)   HDL Cholesterol     >40 mg/dL 34 (L)   Total CHOL/HDL Ratio     RATIO 3.6   VLDL     0 - 40 mg/dL 50 (H)   LDL (calc)     0 - 99 mg/dL 40   PTT Lupus Anticoagulant     0.0 - 51.9 sec  25.7  DRVVT     0.0 - 47.0 sec  44.9  Lupus Anticoag Interp       Comment:  Beta-2 Glycoprotein I Ab, IgG     0 - 20 GPI IgG units  <9  Beta-2-Glycoprotein I IgM     0 - 32 GPI IgM units  <9  Beta-2-Glycoprotein I IgA     0 - 25 GPI IgA units  <9  Anticardiolipin Ab,IgG,Qn     0 - 14 GPL U/mL  <9  Anticardiolipin Ab,IgM,Qn     0 - 12 MPL U/mL  <9  Anticardiolipin Ab,IgA,Qn     0 - 11 APL U/mL  <9  Hemoglobin A1C     4.8 - 5.6 % 4.9   Mean Plasma Glucose     mg/dL 94   [id number]-F5LEID:       Comment  Comment       Comment  Recommendations-PTGENE:       Comment  Additional Information       Comment  TSH     0.350 - 4.500 uIU/mL 2.534   HIV     Non Reactive  Non Reactive  Antithrombin Activity     75 - 120 %  117  Protein C-Functional     73 - 180 %  169  Protein C, Total     60 - 150 %  140  Protein S-Functional     63 - 140 %  82  Protein S, Total     60 - 150 %  150  Homocysteine     0.0 - 15.0 umol/L  32.0 (H)    Assessment: As you may recall, she is a 46 y.o. African American female with PMH of OSA, ESRD on peritoneal dialysis, migraine headaches, HTN, and anxiety admitted on 07/27/16 for transient speech difficulties. 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 MRA - normal, CTA H&N unremarkable, no DVT but TCD bubble study indicated large PFO. 2D Echo with  EF 65-70%. Loop recorder placed.  Due to thrombocytopenia, she was discharged with ASA 81 and recommended outpt TEE and possible PFO closure with Dr. Excell Seltzer. However, outpt TEE done on 08/14/16 showed no PFO but right atrial thrombus. She was put on coumadin, INR 2-3. However, ASA was continued. Loop recorder no afib. Due to the discrepancy of TEE and TCD regarding PFO detection, CTA chest done and ruled out pulmonary AVM. She complains of dizziness most likely due to low BP. May consider nephrology reset PD parameters to avoid over dialysis. Recent INR subtherautic. She has appointment with Dr. Excell Seltzer to repeat TEE for evaluation of right atrial thrombus and PFO.   Plan:  - continue coumadin for now and follow up coumadin clinic for INR 2-3 - stop ASA  - follow up with Dr. Excell Seltzer on 12/15/16 to repeat TEE to evaluate RA thrombus and check if there is PFO - if still no PFO seen, we will repeat TCD bubble study - pt to discuss with nephrology about low BP to see if need dialysis setting changes to avoid over dialysis - recommend TED hose - keep hydration - sit up and stand up slow to avoid dizzy and fall - Follow up with your primary care physician for stroke risk factor modification. Recommend maintain blood pressure goal 130/80, diabetes with hemoglobin A1c goal below 7.0% and lipids with LDL cholesterol goal below 70 mg/dL.  - follow up in 3 months.   I spent more than 25 minutes of face to face time with the patient. Greater than 50% of time was spent in counseling and coordination of care. We discussed further work up, dialysis setting, avoid fall.    No orders of the defined types were placed in this encounter.   No orders of the defined types were placed in this encounter.   Patient Instructions  - continue coumadin for now and follow up coumadin clinic for INR 2-3 - stop ASA  - follow up with Dr. Excell Seltzer on 12/15/16 to repeat TEE to evaluate right heart clot and check if there is PFO - if still no PFO seen, we will repeat  brain ultrasound - discuss with your kidney doctor about your low BP to see if need dialysis setting changes.  - keep hydrated and drink more fluid - sit up and stand up slow to avoid dizzy and fall - Follow up with your primary care physician for stroke risk factor modification. Recommend maintain blood pressure goal 130/80, diabetes with hemoglobin A1c goal below 7.0% and lipids with LDL cholesterol goal below 70 mg/dL.  - follow up in 3 months.     Marvel Plan, MD PhD [facility] [address] [place], [place] [address] [phone]

## 2016-11-21 NOTE — Patient Instructions (Addendum)
-   continue coumadin for now and follow up coumadin clinic for INR 2-3 - stop ASA  - follow up with Dr. Excell Seltzerooper on 12/15/16 to repeat TEE to evaluate right heart clot and check if there is PFO - if still no PFO seen, we will repeat brain ultrasound - discuss with your kidney doctor about your low BP to see if need dialysis setting changes.  - keep hydrated and drink more fluid - sit up and stand up slow to avoid dizzy and fall - Follow up with your primary care physician for stroke risk factor modification. Recommend maintain blood pressure goal 130/80, diabetes with hemoglobin A1c goal below 7.0% and lipids with LDL cholesterol goal below 70 mg/dL.  - follow up in 3 months.

## 2016-11-24 ENCOUNTER — Ambulatory Visit (INDEPENDENT_AMBULATORY_CARE_PROVIDER_SITE_OTHER): Payer: BC Managed Care – PPO

## 2016-11-24 DIAGNOSIS — Z5181 Encounter for therapeutic drug level monitoring: Secondary | ICD-10-CM | POA: Diagnosis not present

## 2016-11-24 DIAGNOSIS — I6340 Cerebral infarction due to embolism of unspecified cerebral artery: Secondary | ICD-10-CM | POA: Diagnosis not present

## 2016-11-24 DIAGNOSIS — I513 Intracardiac thrombosis, not elsewhere classified: Secondary | ICD-10-CM | POA: Diagnosis not present

## 2016-11-24 LAB — POCT INR: INR: 2.3

## 2016-11-27 ENCOUNTER — Ambulatory Visit (INDEPENDENT_AMBULATORY_CARE_PROVIDER_SITE_OTHER): Payer: BC Managed Care – PPO | Admitting: *Deleted

## 2016-11-27 DIAGNOSIS — I6340 Cerebral infarction due to embolism of unspecified cerebral artery: Secondary | ICD-10-CM | POA: Diagnosis not present

## 2016-11-27 NOTE — Progress Notes (Signed)
Carelink Summary Report / Loop Recorder 

## 2016-12-11 ENCOUNTER — Encounter: Payer: Self-pay | Admitting: Family Medicine

## 2016-12-11 ENCOUNTER — Ambulatory Visit (INDEPENDENT_AMBULATORY_CARE_PROVIDER_SITE_OTHER): Payer: BC Managed Care – PPO | Admitting: Family Medicine

## 2016-12-11 VITALS — BP 142/112 | HR 116 | Temp 98.0°F | Resp 17 | Wt 203.0 lb

## 2016-12-11 DIAGNOSIS — M79605 Pain in left leg: Secondary | ICD-10-CM

## 2016-12-11 DIAGNOSIS — M79604 Pain in right leg: Secondary | ICD-10-CM

## 2016-12-11 LAB — CUP PACEART REMOTE DEVICE CHECK
Date Time Interrogation Session: 20180714221410
MDC IDC PG IMPLANT DT: 20180316

## 2016-12-11 MED ORDER — OXYCODONE HCL 5 MG PO TABS: ORAL_TABLET | ORAL | 0 refills | Status: DC

## 2016-12-11 NOTE — Progress Notes (Signed)
Subjective:     Patient ID: Elizabeth Anthony, female   DOB: 1970-07-06, 46 y.o.   MRN: [id number]  HPI  Chief Complaint  Patient presents with  . Leg Pain    Patient comes in office today with complaints of pain in her lower legs radiating down to her feet  since 12/08/16. Patient reports stiffness in joints, hot tingling sensation bottom of fett, and prickling in her lower extremities starting from hip down to feet.Patient states that pain is worse at night time and has tried to keep feet elevated.   States she was at the beach last week and developed pain. Denies injury but states they would walk their dog up to 3 miles daily. She is on daily peritoneal dialysis and is pending labs this week. She will be seeing her renal M.D., Dr. Darrick Pennaeterding, next week.   Review of Systems     Objective:   Physical Exam  Constitutional: She appears well-developed and well-nourished. She appears distressed (mild discomfort from leg pain).  Cardiovascular:  Pulses:      Dorsalis pedis pulses are 2+ on the right side, and 2+ on the left side.       Posterior tibial pulses are 2+ on the right side, and 2+ on the left side.  Musculoskeletal: She exhibits no edema (of lower extremities).  SLR's negative for increased pain or radiation of pain. M.S.in lower extremities 5/5. Knees with FROM and minimal discomfort. No knee effusion or patellar tendernesss       Assessment:    1. Leg pain, bilateral; ? Overuse ? Electrolyte imbalance - oxyCODONE (ROXICODONE) 5 MG immediate release tablet; 1/2 to one ever 4-6 hours for severe pain  Dispense: 30 tablet; Refill: 0    Plan:    Proceed with labs as scheduled.

## 2016-12-11 NOTE — Patient Instructions (Signed)
Please get labs per your renal doctor.

## 2016-12-15 ENCOUNTER — Ambulatory Visit (INDEPENDENT_AMBULATORY_CARE_PROVIDER_SITE_OTHER): Payer: BC Managed Care – PPO | Admitting: Cardiovascular Disease

## 2016-12-15 ENCOUNTER — Ambulatory Visit (INDEPENDENT_AMBULATORY_CARE_PROVIDER_SITE_OTHER): Payer: BC Managed Care – PPO | Admitting: *Deleted

## 2016-12-15 ENCOUNTER — Encounter: Payer: Self-pay | Admitting: Cardiovascular Disease

## 2016-12-15 VITALS — BP 118/90 | HR 104 | Ht 63.5 in | Wt 194.2 lb

## 2016-12-15 DIAGNOSIS — I6340 Cerebral infarction due to embolism of unspecified cerebral artery: Secondary | ICD-10-CM

## 2016-12-15 DIAGNOSIS — I513 Intracardiac thrombosis, not elsewhere classified: Secondary | ICD-10-CM | POA: Diagnosis not present

## 2016-12-15 DIAGNOSIS — Z5181 Encounter for therapeutic drug level monitoring: Secondary | ICD-10-CM | POA: Diagnosis not present

## 2016-12-15 LAB — POCT INR: INR: 2.5

## 2016-12-15 NOTE — Patient Instructions (Addendum)
Medication Instructions:  Your physician recommends that you continue on your current medications as directed. Please refer to the Current Medication list given to you today.  Labwork: No new orders.   Testing/Procedures: Your physician has requested that you have a TEE. During a TEE, sound waves are used to create images of your heart. It provides your doctor with information about the size and shape of your heart and how well your heart's chambers and valves are working. In this test, a transducer is attached to the end of a flexible tube that's guided down your throat and into your esophagus (the tube leading from you mouth to your stomach) to get a more detailed image of your heart. You are not awake for the procedure. Please see the instruction sheet given to you today. For further information please visit [email or web address].  You are scheduled for a TEE on Wednesday, December 20, 2016 with Dr. Tobias AlexanderKatarina Nelson.  [facility] at 8:30 AM the day of your procedure.  1.) Diet:  A.) Nothing to eat or drink after midnight except your medications with a sip of water.   2.) Must have a responsible person to drive you home.  3.) Bring your current insurance cards and current list of all your medications.   *Special Note:  Every effort is made to have your procedure done on time.  Occasionally there are emergencies that present themselves at the hospital that may cause delays.  Please be patient if a delay does occur.  *If you have any questions after you get home, please call the office at [phone].  Follow-Up: Your physician recommends that you schedule a follow-up appointment in: 2-3 WEEKS with Dr Excell Seltzerooper   Any Other Special Instructions Will Be Listed Below (If Applicable).     If you need a refill on your cardiac medications before your next appointment, please call your pharmacy.

## 2016-12-15 NOTE — Progress Notes (Signed)
Cardiology Office Note Date:  12/15/2016   ID:  Elizabeth Anthony, DOB 09-Jun-1970, MRN [id number]  PCP:  Maple HudsonGilbert, Richard L Jr., MD  Cardiologist:  [name], Frankie Scipio, MD    Chief Complaint  Patient presents with  . Follow-up    after 3 mths of anticoag/discuss need for repeat TEE w/ bubble study     History of Present Illness: Elizabeth Anthony is a 46 y.o. female who presents for follow-up evaluation. The patient has a fairly complex history. 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 She had been using peritoneal dialysis for many years. There was an AV fistula attempted that was unsuccessful. The patient had a tunneled dialysis catheter in place and she presented with a stroke in March 2018 with symptoms of expressive aphasia. Imaging demonstrated acute/subacute infarction of the right posterior frontal lobe, left superior parietal lobe, and left superior cerebellum. The patient had a transcranial Doppler study performed that demonstrated right to left shunting and "curtain effect." The patient was evaluated in office consultation and underwent transesophageal echo which did not demonstrate a PFO. However, this study showed a large right atrial thrombus and a echo bright linear density that was concerning for a retained catheter tip. The patient was placed on warfarin which she has been taking for the last 4 months and she returns today for follow-up evaluation.  The patient is here alone today. She's had some problems with aching in her legs. She complains of a knot in her right antecubital fossa that is tender to touch. She denies chest pain or shortness of breath. She complains of rapid heart rate but this is chronic. No recent neurologic symptoms. No bleeding problems on warfarin.   Past Medical History:  Diagnosis Date  . Allergy   . Anemia   . Anxiety   . Hypertension   . Migraine   . PONV (postoperative nausea and  vomiting)   . Renal disorder    Dialysis M/W/F  . Sleep apnea    uses cpap  . Stroke [facility](H[facility])     Past Surgical History:  Procedure Laterality Date  . BASCILIC VEIN TRANSPOSITION Left 09/30/2015   Procedure: LEFT ARM FIRST STAGE BASILIC VEIN TRANSPOSITION;  Surgeon: Nada LibmanVance W Brabham, MD;  Location: [facility] OR;  Service: Vascular;  Laterality: Left;  . BASCILIC VEIN TRANSPOSITION Left 11/30/2015   Procedure: SECOND STAGE BASILIC VEIN TRANSPOSITION, left arm;  Surgeon: Nada LibmanVance W Brabham, MD;  [facility];  Service: Vascular;  Laterality: Left;  . BREAST REDUCTION SURGERY Bilateral   . insertion hemodialysis catheter    . LOOP RECORDER INSERTION N/A 07/28/2016   Procedure: Loop Recorder Insertion;  Surgeon: Hillis RangeJames Allred, MD;  Location: [facility] INVASIVE CV LAB;  Service: Cardiovascular;  Laterality: N/A;  . REDUCTION MAMMAPLASTY  1998  . TEE WITHOUT CARDIOVERSION N/A 08/14/2016   Procedure: TRANSESOPHAGEAL ECHOCARDIOGRAM (TEE);  Surgeon: Thurmon FairMihai Croitoru, MD;  [facility];  Service: Cardiovascular;  Laterality: N/A;    Current Outpatient Prescriptions  Medication Sig Dispense Refill  . ALPRAZolam (XANAX) 0.5 MG tablet Take 1 tablet (0.5 mg total) by mouth 2 (two) times daily as needed for anxiety. 60 tablet 3  . amoxicillin (AMOXIL) 500 MG tablet Take 4 tablets by mouth one hour prior to dental appointment 8 tablet 1  . calcitRIOL (ROCALTROL) 0.5 MCG capsule Take 0.5 mcg by mouth 2 (two) times daily.     . calcium acetate (PHOSLO) 667 MG capsule Take 1,334 mg by mouth  4 (four) times daily.     . cetirizine (ZYRTEC) 10 MG tablet Take 10 mg by mouth daily.     Marland Kitchen. gentamicin cream (GARAMYCIN) 0.1 % Apply 1 application topically daily.     . multivitamin (RENA-VIT) TABS tablet Take 1 tablet by mouth at bedtime.  11  . NON FORMULARY Dialysis. AT home 7 days a week    . sevelamer carbonate (RENVELA) 800 MG tablet Take 800-2,400 mg by mouth See admin instructions. Take 2400 mg by mouth 3 times daily  with meals. Take 800 mg by mouth with snacks.    . warfarin (COUMADIN) 5 MG tablet TAKE 1 TABLET DAILY OR AS DIRECTED BY COUMADIN CLINIC 30 tablet 3   Current Facility-Administered Medications  Medication Dose Route Frequency Provider Last Rate Last Dose  . hepatitis B vaccine (ENGERIX-B) injection 20 mcg  1 mL Intramuscular Once Lorie PhenixMaloney, Nancy, MD        Allergies:   Ace inhibitors; Dye fdc red [red dye]; Nsaids; Vancomycin; Alcohol-sulfur [sulfur]; and Alcohol   Social History:  The patient  reports that she has never smoked. She has never used smokeless tobacco. She reports that she does not drink alcohol or use drugs.   Family History:  The patient's  family history includes Breast cancer (age of onset: 4655) in her mother; Kidney disease in her mother.    ROS:  Please see the history of present illness.  Otherwise, review of systems is positive for Poor appetite, cough, joint pain, anxiety, constipation, headaches.  All other systems are reviewed and negative.    PHYSICAL EXAM: VS:  BP 118/90   Pulse (!) 104   Ht 5' 3.5" (1.613 m)   Wt 194 lb 3.2 oz (88.1 kg)   BMI 33.86 kg/m  , BMI Body mass index is 33.86 kg/m. GEN: Well nourished, well developed, in no acute distress  HEENT: normal  Neck: no JVD, no masses. No carotid bruits Cardiac: tachy and regular without murmur or gallop        Respiratory:  clear to auscultation bilaterally, normal work of breathing GI: soft, nontender, nondistended, + BS MS: no deformity or atrophy  Ext: no pretibial edema Skin: warm and dry, no rash Neuro:  Strength and sensation are intact Psych: euthymic mood, full affect  EKG:  EKG is ordered today. The ekg ordered today shows sinus tachycardia 117 bpm, otherwise within normal limits.  Recent Labs: 07/27/2016: TSH 2.534 09/08/2016: ALT 15; BUN 50; Creatinine, Ser 17.20; Hemoglobin 11.2; Platelets 255; Potassium 4.2; Sodium 134   Lipid Panel     Component Value Date/Time   CHOL 124  07/27/2016 1950   CHOL 185 03/29/2015 0903   TRIG 249 (H) 07/27/2016 1950   HDL 34 (L) 07/27/2016 1950   HDL 46 03/29/2015 0903   CHOLHDL 3.6 07/27/2016 1950   VLDL 50 (H) 07/27/2016 1950   LDLCALC 40 07/27/2016 1950   LDLCALC 111 (H) 03/29/2015 0903      Wt Readings from Last 3 Encounters:  12/15/16 194 lb 3.2 oz (88.1 kg)  12/11/16 203 lb (92.1 kg)  11/21/16 190 lb 9.6 oz (86.5 kg)     Cardiac Studies Reviewed: Echo 07/28/2016: Study Conclusions  - Left ventricle: The cavity size was normal. Wall thickness was   increased in a pattern of mild LVH. Systolic function was   vigorous. The estimated ejection fraction was in the range of 65%   to 70%. Wall motion was normal; there were no regional wall  motion abnormalities. Doppler parameters are consistent with   abnormal left ventricular relaxation (grade 1 diastolic   dysfunction). - Aortic valve: There was no stenosis. - Mitral valve: There was no significant regurgitation. - Right ventricle: The cavity size was normal. Systolic function   was normal. - Pulmonary arteries: No complete TR doppler jet so unable to   estimate PA systolic pressure. - Inferior vena cava: The vessel was normal in size. The   respirophasic diameter changes were in the normal range (>= 50%),   consistent with normal central venous pressure.  Impressions:  - Normal LV size with EF 65-70%, mild LV hypertrophy. Normal RV   size and systolic function. No significant valvular   abnormalities.  TEE 08/14/2016: Study Conclusions  - Left ventricle: Systolic function was normal. The estimated   ejection fraction was in the range of 60% to 65%. Wall motion was   normal; there were no regional wall motion abnormalities. - Aortic valve: No evidence of vegetation. - Mitral valve: No evidence of vegetation. - Left atrium: No evidence of thrombus in the atrial cavity or   appendage. No spontaneous echo contrast was observed. - Right atrium: The  atrium was normal in size. A large fixed mass   (probably organized thrombus) is seen in the cavity. A mobile   very bright (metallic?) filament is attached to the mass. - Atrial septum: No defect or patent foramen ovale was identified.   Echo contrast study showed no right-to-left atrial level shunt,   at baseline or with provocation. Very late after one injection, a   few saline bubbles are seen in the ascending aorta. Pulmonary AV   shunting is suspected. - Tricuspid valve: No evidence of vegetation. - Systemic veins: The superior vena cava is widely patent, but is   circumferentially thickened up to 7 mm wall thickness (consider   organized thrombus).   ASSESSMENT AND PLAN: 46 year old woman with end-stage renal disease on peritoneal dialysis who presented with a cryptogenic stroke, but was found to have a positive transcranial Doppler study and right atrial thrombus with associated retained catheter tip. I have reviewed the patients TEE images with her today and explained the findings from her previous TEE as well as the potential relationship to her stroke. It is unusual that she had a strongly positive transcranial Doppler study but there was no PFO demonstrated on her TEE. I have recommended a repeat TEE because of the findings on the initial study with right atrial thrombus. Hopefully there has been resolution of this mass in her right atrium with 4 months of anticoagulation. It will also be interesting to reassess her interatrial septum and confirm the absence of a PFO considering her previous positive transcranial Doppler study. She did have a CT angiogram of the chest which demonstrated no pulmonary AVMs.  I have reviewed the risks, indications, and alternatives to transesophageal echo. The patient understands and agrees to proceed. This will be scheduled next week.  Current medicines are reviewed with the patient today.  The patient does not have concerns regarding  medicines.  Labs/ tests ordered today include:  No orders of the defined types were placed in this encounter.  Disposition:   FU pending TEE result  Signed, [name], Djimon Lundstrom, MD  12/15/2016 5:17 PM    [facility] [address], [place], [place]  [address] Phone: [phone](336) [phone]; Fax: [phone]) [phone]

## 2016-12-18 ENCOUNTER — Other Ambulatory Visit: Payer: Self-pay

## 2016-12-18 DIAGNOSIS — I513 Intracardiac thrombosis, not elsewhere classified: Secondary | ICD-10-CM

## 2016-12-20 ENCOUNTER — Ambulatory Visit (HOSPITAL_COMMUNITY): Payer: BC Managed Care – PPO

## 2016-12-20 ENCOUNTER — Ambulatory Visit (HOSPITAL_COMMUNITY): Payer: BC Managed Care – PPO | Admitting: Certified Registered Nurse Anesthetist

## 2016-12-20 ENCOUNTER — Encounter (HOSPITAL_COMMUNITY): Payer: Self-pay | Admitting: *Deleted

## 2016-12-20 ENCOUNTER — Ambulatory Visit (HOSPITAL_COMMUNITY)
Admission: RE | Admit: 2016-12-20 | Discharge: 2016-12-20 | Disposition: A | Discharge status: Home / Self Care | Payer: BC Managed Care – PPO | Source: Ambulatory Visit | Attending: Cardiology | Admitting: Cardiology

## 2016-12-20 ENCOUNTER — Encounter (HOSPITAL_COMMUNITY)
Admission: RE | Disposition: A | Discharge status: Home / Self Care | Payer: Self-pay | Source: Ambulatory Visit | Attending: Cardiology

## 2016-12-20 DIAGNOSIS — I340 Nonrheumatic mitral (valve) insufficiency: Secondary | ICD-10-CM | POA: Diagnosis not present

## 2016-12-20 DIAGNOSIS — G43909 Migraine, unspecified, not intractable, without status migrainosus: Secondary | ICD-10-CM | POA: Insufficient documentation

## 2016-12-20 DIAGNOSIS — N186 End stage renal disease: Secondary | ICD-10-CM | POA: Diagnosis not present

## 2016-12-20 DIAGNOSIS — Z7901 Long term (current) use of anticoagulants: Secondary | ICD-10-CM | POA: Insufficient documentation

## 2016-12-20 DIAGNOSIS — I120 Hypertensive chronic kidney disease with stage 5 chronic kidney disease or end stage renal disease: Secondary | ICD-10-CM | POA: Diagnosis not present

## 2016-12-20 DIAGNOSIS — Z992 Dependence on renal dialysis: Secondary | ICD-10-CM | POA: Diagnosis not present

## 2016-12-20 DIAGNOSIS — G4730 Sleep apnea, unspecified: Secondary | ICD-10-CM | POA: Insufficient documentation

## 2016-12-20 DIAGNOSIS — I513 Intracardiac thrombosis, not elsewhere classified: Secondary | ICD-10-CM

## 2016-12-20 DIAGNOSIS — Z8673 Personal history of transient ischemic attack (TIA), and cerebral infarction without residual deficits: Secondary | ICD-10-CM | POA: Insufficient documentation

## 2016-12-20 DIAGNOSIS — F419 Anxiety disorder, unspecified: Secondary | ICD-10-CM | POA: Diagnosis not present

## 2016-12-20 HISTORY — PX: TEE WITHOUT CARDIOVERSION: SHX5443

## 2016-12-20 SURGERY — ECHOCARDIOGRAM, TRANSESOPHAGEAL
Anesthesia: Monitor Anesthesia Care

## 2016-12-20 MED ORDER — BUTAMBEN-TETRACAINE-BENZOCAINE 2-2-14 % EX AERO
INHALATION_SPRAY | CUTANEOUS | Status: DC | PRN
  Administered 2016-12-20: 2 via TOPICAL

## 2016-12-20 MED ORDER — SODIUM CHLORIDE 0.9 % IV SOLN
INTRAVENOUS | Status: DC
  Administered 2016-12-20 (×3): via INTRAVENOUS

## 2016-12-20 MED ORDER — FENTANYL CITRATE (PF) 100 MCG/2ML IJ SOLN: INTRAMUSCULAR | Status: DC | PRN

## 2016-12-20 MED ORDER — PROPOFOL 10 MG/ML IV BOLUS
INTRAVENOUS | Status: DC | PRN
  Administered 2016-12-20 (×3): 20 mg via INTRAVENOUS

## 2016-12-20 MED ORDER — PROPOFOL 500 MG/50ML IV EMUL
INTRAVENOUS | Status: DC | PRN
  Administered 2016-12-20: 50 ug/kg/min via INTRAVENOUS

## 2016-12-20 NOTE — Anesthesia Preprocedure Evaluation (Addendum)
Anesthesia Evaluation  Patient identified by MRN, date of birth, ID band Patient awake    Reviewed: Allergy & Precautions, NPO status , Patient's Chart, lab work & pertinent test results  History of Anesthesia Complications (+) PONV and history of anesthetic complications  Airway Mallampati: III  TM Distance: >3 FB Neck ROM: Full    Dental  (+) Teeth Intact, Dental Advisory Given   Pulmonary sleep apnea and Continuous Positive Airway Pressure Ventilation ,    breath sounds clear to auscultation       Cardiovascular  Rhythm:Regular Rate:Normal  ECG: ST, rate 117  ECHO: Normal LV size with EF 65-70%, mild LV hypertrophy. Normal RV size and systolic function. No significant valvular abnormalities.   Neuro/Psych  Headaches, Anxiety TIACVA, No Residual Symptoms    GI/Hepatic   Endo/Other    Renal/GU Dialysis and ESRFRenal disease     Musculoskeletal   Abdominal (+) + obese,   Peds  Hematology   Anesthesia Other Findings Ambulates with walker  Reproductive/Obstetrics                            Anesthesia Physical  Anesthesia Plan  ASA: IV  Anesthesia Plan: MAC   Post-op Pain Management:    Induction: Intravenous  PONV Risk Score and Plan: 3 and Propofol infusion and Treatment may vary due to age or medical condition  Airway Management Planned:   Additional Equipment:   Intra-op Plan:   Post-operative Plan:   Informed Consent: I have reviewed the patients History and Physical, chart, labs and discussed the procedure including the risks, benefits and alternatives for the proposed anesthesia with the patient or authorized representative who has indicated his/her understanding and acceptance.   Dental advisory given  Plan Discussed with: CRNA  Anesthesia Plan Comments:         Anesthesia Quick Evaluation

## 2016-12-20 NOTE — Transfer of Care (Signed)
Immediate Anesthesia Transfer of Care Note  Patient: Elizabeth Anthony  Procedure(s) Performed: Procedure(s): TRANSESOPHAGEAL ECHOCARDIOGRAM (TEE) (N/A)  Patient Location: Endoscopy Unit  Anesthesia Type:MAC  Level of Consciousness: awake, alert , oriented and patient cooperative  Airway & Oxygen Therapy: Patient Spontanous Breathing and Patient connected to nasal cannula oxygen  Post-op Assessment: Report given to RN, Post -op Vital signs reviewed and stable and Patient moving all extremities X 4  Post vital signs: Reviewed and stable  Last Vitals:  Vitals:   12/20/16 0836 12/20/16 1107  BP: 111/74   Pulse: (!) 103 92  Resp: 17   Temp: 36.9 C     Last Pain:  Vitals:   12/20/16 0836  TempSrc: Oral  PainSc: 3       Patients Stated Pain Goal: 2 (68/61/68 3729)  Complications: No apparent anesthesia complications

## 2016-12-20 NOTE — Discharge Instructions (Signed)

## 2016-12-20 NOTE — H&P (View-Only) (Signed)
Cardiology Office Note Date:  12/15/2016   ID:  Elizabeth CardHerschelle Anthony, DOB 09-Jun-1970, MRN [id number]  PCP:  Maple HudsonGilbert, Richard L Jr., MD  Cardiologist:  [name], Stevon Gough, MD    Chief Complaint  Patient presents with  . Follow-up    after 3 mths of anticoag/discuss need for repeat TEE w/ bubble study     History of Present Illness: Elizabeth Anthony is a 46 y.o. female who presents for follow-up evaluation. The patient has a fairly complex history. 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 She had been using peritoneal dialysis for many years. There was an AV fistula attempted that was unsuccessful. The patient had a tunneled dialysis catheter in place and she presented with a stroke in March 2018 with symptoms of expressive aphasia. Imaging demonstrated acute/subacute infarction of the right posterior frontal lobe, left superior parietal lobe, and left superior cerebellum. The patient had a transcranial Doppler study performed that demonstrated right to left shunting and "curtain effect." The patient was evaluated in office consultation and underwent transesophageal echo which did not demonstrate a PFO. However, this study showed a large right atrial thrombus and a echo bright linear density that was concerning for a retained catheter tip. The patient was placed on warfarin which she has been taking for the last 4 months and she returns today for follow-up evaluation.  The patient is here alone today. She's had some problems with aching in her legs. She complains of a knot in her right antecubital fossa that is tender to touch. She denies chest pain or shortness of breath. She complains of rapid heart rate but this is chronic. No recent neurologic symptoms. No bleeding problems on warfarin.   Past Medical History:  Diagnosis Date  . Allergy   . Anemia   . Anxiety   . Hypertension   . Migraine   . PONV (postoperative nausea and  vomiting)   . Renal disorder    Dialysis M/W/F  . Sleep apnea    uses cpap  . Stroke [facility])     Past Surgical History:  Procedure Laterality Date  . BASCILIC VEIN TRANSPOSITION Left 09/30/2015   Procedure: LEFT ARM FIRST STAGE BASILIC VEIN TRANSPOSITION;  Surgeon: Nada LibmanVance W Brabham, MD;  Location: [facility] OR;  Service: Vascular;  Laterality: Left;  . BASCILIC VEIN TRANSPOSITION Left 11/30/2015   Procedure: SECOND STAGE BASILIC VEIN TRANSPOSITION, left arm;  Surgeon: Nada LibmanVance W Brabham, MD;  [facility];  Service: Vascular;  Laterality: Left;  . BREAST REDUCTION SURGERY Bilateral   . insertion hemodialysis catheter    . LOOP RECORDER INSERTION N/A 07/28/2016   Procedure: Loop Recorder Insertion;  Surgeon: Hillis RangeJames Allred, MD;  Location: [facility] INVASIVE CV LAB;  Service: Cardiovascular;  Laterality: N/A;  . REDUCTION MAMMAPLASTY  1998  . TEE WITHOUT CARDIOVERSION N/A 08/14/2016   Procedure: TRANSESOPHAGEAL ECHOCARDIOGRAM (TEE);  Surgeon: Thurmon FairMihai Croitoru, MD;  [facility];  Service: Cardiovascular;  Laterality: N/A;    Current Outpatient Prescriptions  Medication Sig Dispense Refill  . ALPRAZolam (XANAX) 0.5 MG tablet Take 1 tablet (0.5 mg total) by mouth 2 (two) times daily as needed for anxiety. 60 tablet 3  . amoxicillin (AMOXIL) 500 MG tablet Take 4 tablets by mouth one hour prior to dental appointment 8 tablet 1  . calcitRIOL (ROCALTROL) 0.5 MCG capsule Take 0.5 mcg by mouth 2 (two) times daily.     . calcium acetate (PHOSLO) 667 MG capsule Take 1,334 mg by mouth  4 (four) times daily.     . cetirizine (ZYRTEC) 10 MG tablet Take 10 mg by mouth daily.     Marland Kitchen. gentamicin cream (GARAMYCIN) 0.1 % Apply 1 application topically daily.     . multivitamin (RENA-VIT) TABS tablet Take 1 tablet by mouth at bedtime.  11  . NON FORMULARY Dialysis. AT home 7 days a week    . sevelamer carbonate (RENVELA) 800 MG tablet Take 800-2,400 mg by mouth See admin instructions. Take 2400 mg by mouth 3 times daily  with meals. Take 800 mg by mouth with snacks.    . warfarin (COUMADIN) 5 MG tablet TAKE 1 TABLET DAILY OR AS DIRECTED BY COUMADIN CLINIC 30 tablet 3   Current Facility-Administered Medications  Medication Dose Route Frequency Provider Last Rate Last Dose  . hepatitis B vaccine (ENGERIX-B) injection 20 mcg  1 mL Intramuscular Once Lorie PhenixMaloney, Nancy, MD        Allergies:   Ace inhibitors; Dye fdc red [red dye]; Nsaids; Vancomycin; Alcohol-sulfur [sulfur]; and Alcohol   Social History:  The patient  reports that she has never smoked. She has never used smokeless tobacco. She reports that she does not drink alcohol or use drugs.   Family History:  The patient's  family history includes Breast cancer (age of onset: 4655) in her mother; Kidney disease in her mother.    ROS:  Please see the history of present illness.  Otherwise, review of systems is positive for Poor appetite, cough, joint pain, anxiety, constipation, headaches.  All other systems are reviewed and negative.    PHYSICAL EXAM: VS:  BP 118/90   Pulse (!) 104   Ht 5' 3.5" (1.613 m)   Wt 194 lb 3.2 oz (88.1 kg)   BMI 33.86 kg/m  , BMI Body mass index is 33.86 kg/m. GEN: Well nourished, well developed, in no acute distress  HEENT: normal  Neck: no JVD, no masses. No carotid bruits Cardiac: tachy and regular without murmur or gallop        Respiratory:  clear to auscultation bilaterally, normal work of breathing GI: soft, nontender, nondistended, + BS MS: no deformity or atrophy  Ext: no pretibial edema Skin: warm and dry, no rash Neuro:  Strength and sensation are intact Psych: euthymic mood, full affect  EKG:  EKG is ordered today. The ekg ordered today shows sinus tachycardia 117 bpm, otherwise within normal limits.  Recent Labs: 07/27/2016: TSH 2.534 09/08/2016: ALT 15; BUN 50; Creatinine, Ser 17.20; Hemoglobin 11.2; Platelets 255; Potassium 4.2; Sodium 134   Lipid Panel     Component Value Date/Time   CHOL 124  07/27/2016 1950   CHOL 185 03/29/2015 0903   TRIG 249 (H) 07/27/2016 1950   HDL 34 (L) 07/27/2016 1950   HDL 46 03/29/2015 0903   CHOLHDL 3.6 07/27/2016 1950   VLDL 50 (H) 07/27/2016 1950   LDLCALC 40 07/27/2016 1950   LDLCALC 111 (H) 03/29/2015 0903      Wt Readings from Last 3 Encounters:  12/15/16 194 lb 3.2 oz (88.1 kg)  12/11/16 203 lb (92.1 kg)  11/21/16 190 lb 9.6 oz (86.5 kg)     Cardiac Studies Reviewed: Echo 07/28/2016: Study Conclusions  - Left ventricle: The cavity size was normal. Wall thickness was   increased in a pattern of mild LVH. Systolic function was   vigorous. The estimated ejection fraction was in the range of 65%   to 70%. Wall motion was normal; there were no regional wall  motion abnormalities. Doppler parameters are consistent with   abnormal left ventricular relaxation (grade 1 diastolic   dysfunction). - Aortic valve: There was no stenosis. - Mitral valve: There was no significant regurgitation. - Right ventricle: The cavity size was normal. Systolic function   was normal. - Pulmonary arteries: No complete TR doppler jet so unable to   estimate PA systolic pressure. - Inferior vena cava: The vessel was normal in size. The   respirophasic diameter changes were in the normal range (>= 50%),   consistent with normal central venous pressure.  Impressions:  - Normal LV size with EF 65-70%, mild LV hypertrophy. Normal RV   size and systolic function. No significant valvular   abnormalities.  TEE 08/14/2016: Study Conclusions  - Left ventricle: Systolic function was normal. The estimated   ejection fraction was in the range of 60% to 65%. Wall motion was   normal; there were no regional wall motion abnormalities. - Aortic valve: No evidence of vegetation. - Mitral valve: No evidence of vegetation. - Left atrium: No evidence of thrombus in the atrial cavity or   appendage. No spontaneous echo contrast was observed. - Right atrium: The  atrium was normal in size. A large fixed mass   (probably organized thrombus) is seen in the cavity. A mobile   very bright (metallic?) filament is attached to the mass. - Atrial septum: No defect or patent foramen ovale was identified.   Echo contrast study showed no right-to-left atrial level shunt,   at baseline or with provocation. Very late after one injection, a   few saline bubbles are seen in the ascending aorta. Pulmonary AV   shunting is suspected. - Tricuspid valve: No evidence of vegetation. - Systemic veins: The superior vena cava is widely patent, but is   circumferentially thickened up to 7 mm wall thickness (consider   organized thrombus).   ASSESSMENT AND PLAN: 45-year-old woman with end-stage renal disease on peritoneal dialysis who presented with a cryptogenic stroke, but was found to have a positive transcranial Doppler study and right atrial thrombus with associated retained catheter tip. I have reviewed the patients TEE images with her today and explained the findings from her previous TEE as well as the potential relationship to her stroke. It is unusual that she had a strongly positive transcranial Doppler study but there was no PFO demonstrated on her TEE. I have recommended a repeat TEE because of the findings on the initial study with right atrial thrombus. Hopefully there has been resolution of this mass in her right atrium with 4 months of anticoagulation. It will also be interesting to reassess her interatrial septum and confirm the absence of a PFO considering her previous positive transcranial Doppler study. She did have a CT angiogram of the chest which demonstrated no pulmonary AVMs.  I have reviewed the risks, indications, and alternatives to transesophageal echo. The patient understands and agrees to proceed. This will be scheduled next week.  Current medicines are reviewed with the patient today.  The patient does not have concerns regarding  medicines.  Labs/ tests ordered today include:  No orders of the defined types were placed in this encounter.  Disposition:   FU pending TEE result  Signed, Kayleann Mccaffery, MD  12/15/2016 5:17 PM    [facility] [address], [place],   [address] Phone: [phone]; Fax: [phone]  

## 2016-12-20 NOTE — Interval H&P Note (Signed)
History and Physical Interval Note:  12/20/2016 9:20 AM  Elizabeth Anthony  has presented today for surgery, with the diagnosis of Follow up TEE  The various methods of treatment have been discussed with the patient and family. After consideration of risks, benefits and other options for treatment, the patient has consented to  Procedure(s): TRANSESOPHAGEAL ECHOCARDIOGRAM (TEE) (N/A) as a surgical intervention .  The patient's history has been reviewed, patient examined, no change in status, stable for surgery.  I have reviewed the patient's chart and labs.  Questions were answered to the patient's satisfaction.     Tobias AlexanderKatarina Amariyon Maynes

## 2016-12-20 NOTE — Anesthesia Procedure Notes (Signed)
Procedure Name: MAC Date/Time: 12/20/2016 10:39 AM Performed by: Everlean Cherry A Pre-anesthesia Checklist: Patient identified, Emergency Drugs available, Suction available and Patient being monitored Patient Re-evaluated:Patient Re-evaluated prior to induction Oxygen Delivery Method: Nasal cannula

## 2016-12-20 NOTE — Anesthesia Postprocedure Evaluation (Signed)
Anesthesia Post Note  Patient: Tatjana Spivak  Procedure(s) Performed: Procedure(s) (LRB): TRANSESOPHAGEAL ECHOCARDIOGRAM (TEE) (N/A)     Patient location during evaluation: PACU Anesthesia Type: MAC Level of consciousness: awake and alert Pain management: pain level controlled Vital Signs Assessment: post-procedure vital signs reviewed and stable Respiratory status: spontaneous breathing, nonlabored ventilation, respiratory function stable and patient connected to nasal cannula oxygen Cardiovascular status: stable and blood pressure returned to baseline Anesthetic complications: no    Last Vitals:  Vitals:   12/20/16 1120 12/20/16 1130  BP: (!) 135/95 (!) 151/98  Pulse: 92 92  Resp: 19 17  Temp:      Last Pain:  Vitals:   12/20/16 1110  TempSrc: Oral  PainSc:                  Ryan P Ellender

## 2016-12-20 NOTE — CV Procedure (Signed)
     Transesophageal Echocardiogram Note  Elizabeth Anthony [id number] [birth year]  Procedure: Transesophageal Echocardiogram Indications: RA thrombus  Procedure Details Consent: Obtained Time Out: Verified patient identification, verified procedure, site/side was marked, verified correct patient position, special equipment/implants available, Radiology Safety Procedures followed,  medications/allergies/relevent history reviewed, required imaging and test results available.  Performed  Medications: Iv propofol administered by anesthesia MD  - Left ventricle: There was mild concentric hypertrophy. Systolic   function was normal. The estimated ejection fraction was in the   range of 60% to 65%. Wall motion was normal; there were no   regional wall motion abnormalities. - Aortic valve: No evidence of vegetation. - Mitral valve: There was mild regurgitation. - Left atrium: No evidence of thrombus in the atrial cavity or   appendage. No evidence of thrombus in the atrial cavity or   appendage. - Right atrium: No evidence of thrombus in the atrial cavity or   appendage. No evidence of thrombus in the atrial cavity or   appendage. - Atrial septum: There was a patent foramen ovale. - Pulmonic valve: No evidence of vegetation.  Impressions:  - There is an echodensity attached to the right atrial free wall   measuring 1x9 mm. This is smaller when compared to the previous   echocardiogram from 08/14/2016 when measured 18 x 9 mm. It also   appears more organized.   A bubble study was positive after Valsalva maneuver.  Complications: No apparent complications Patient did tolerate procedure well.  [name], MD, [facility] HospitalFACC 12/20/2016, 12:28 PM

## 2016-12-21 LAB — POCT I-STAT 4, (NA,K, GLUC, HGB,HCT)
Glucose, Bld: 88 mg/dL (ref 65–99)
HCT: 27 % — ABNORMAL LOW (ref 36.0–46.0)
Hemoglobin: 9.2 g/dL — ABNORMAL LOW (ref 12.0–15.0)
Potassium: 3.8 mmol/L (ref 3.5–5.1)
Sodium: 132 mmol/L — ABNORMAL LOW (ref 135–145)

## 2016-12-24 ENCOUNTER — Encounter (HOSPITAL_COMMUNITY): Payer: Self-pay | Admitting: Cardiology

## 2016-12-25 ENCOUNTER — Encounter: Payer: BC Managed Care – PPO | Admitting: *Deleted

## 2016-12-25 ENCOUNTER — Encounter: Payer: Self-pay | Admitting: *Deleted

## 2017-01-11 ENCOUNTER — Ambulatory Visit: Payer: BC Managed Care – PPO | Admitting: Cardiovascular Disease

## 2017-01-13 DEATH — deceased

## 2017-01-31 ENCOUNTER — Ambulatory Visit: Payer: BC Managed Care – PPO | Admitting: Family Medicine

## 2017-02-02 IMAGING — CR DG CHEST 2V
1 series · 2 of 2 positions shown · non-contrast
Comparison: Chest x-ray of 06/08/2011

CLINICAL DATA: Fever of unknown origin for 3 days, dialysis patient

EXAM:
CHEST  2 VIEW

[Series 1: dg chest 2 view · 0.14mm/px · 2 of 2 slices shown]
[im 1/2]
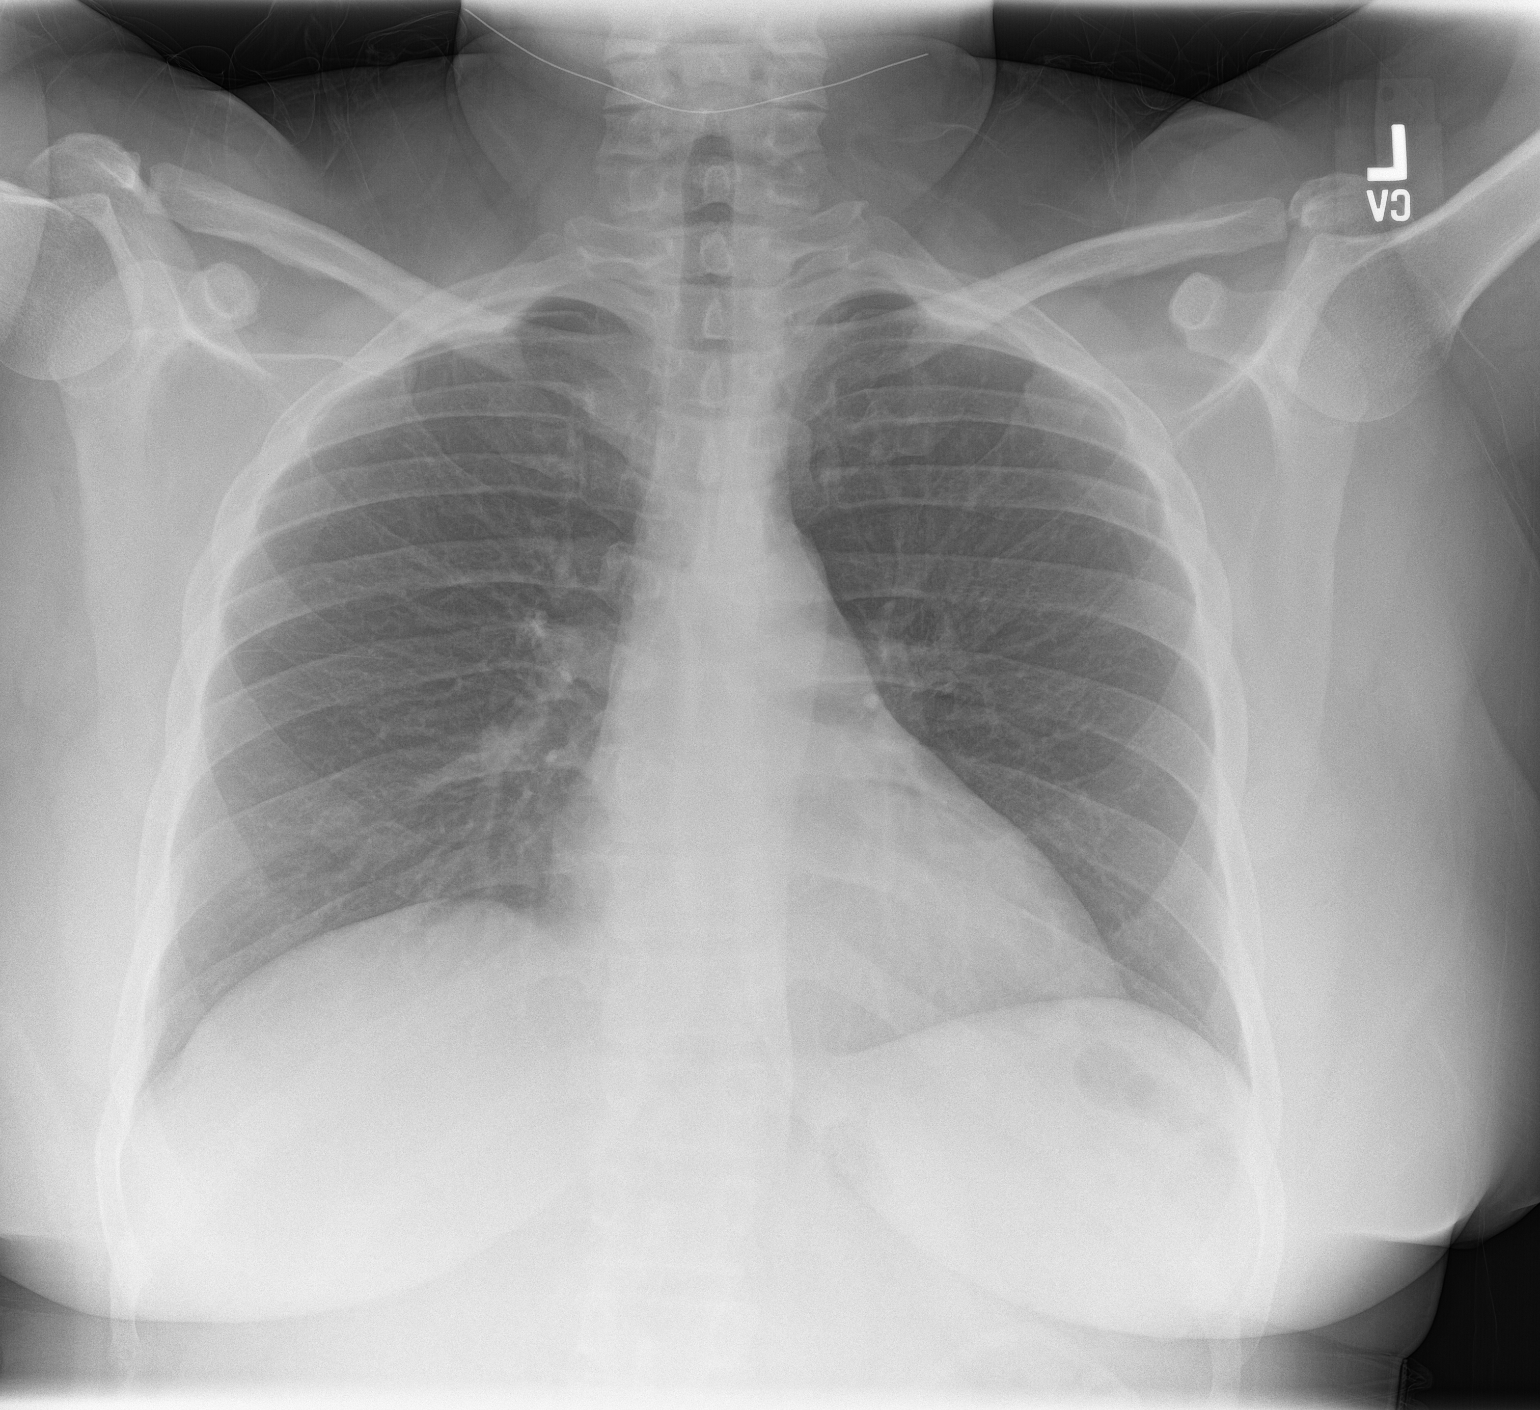
[im 2/2]
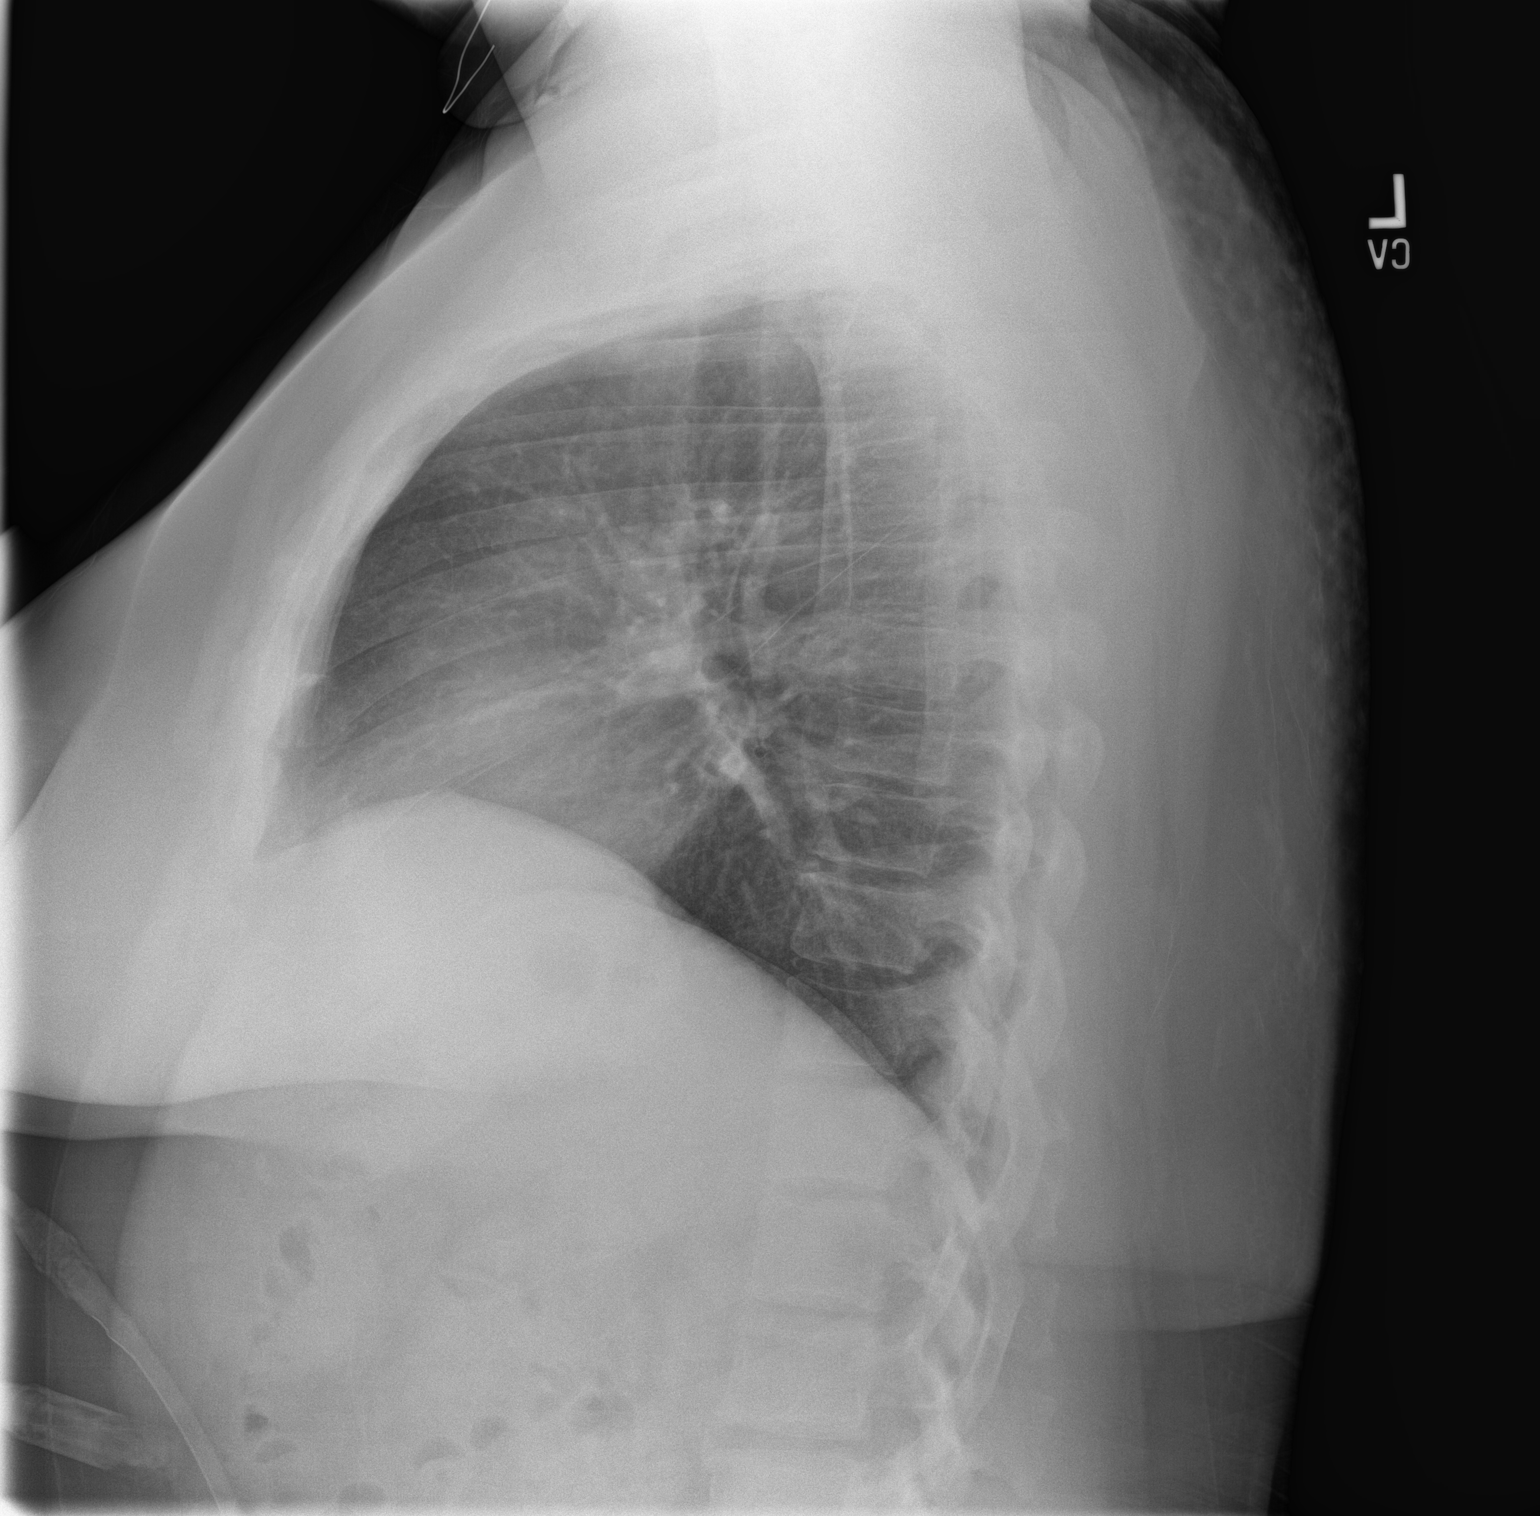

[2 of 2 positions shown; findings below may reference images not displayed]

FINDINGS: No active infiltrate or effusion is seen. There is some
peribronchial thickening present which may indicate bronchitis.
Mediastinal and hilar contours are unremarkable. The heart is within
normal limits in size. No bony abnormality is seen.
IMPRESSION: No pneumonia.  Peribronchial thickening may indicate bronchitis.

## 2017-02-27 ENCOUNTER — Ambulatory Visit: Payer: BC Managed Care – PPO | Admitting: Neurology

## 2018-01-26 IMAGING — DX DG ABDOMEN 1V
2 series · 2 of 2 positions shown · non-contrast
Comparison: CT 03/06/2014

CLINICAL DATA: Peritoneal dialysis catheter placement

EXAM:
ABDOMEN - 1 VIEW

[abdomen kub (1 of 2)]
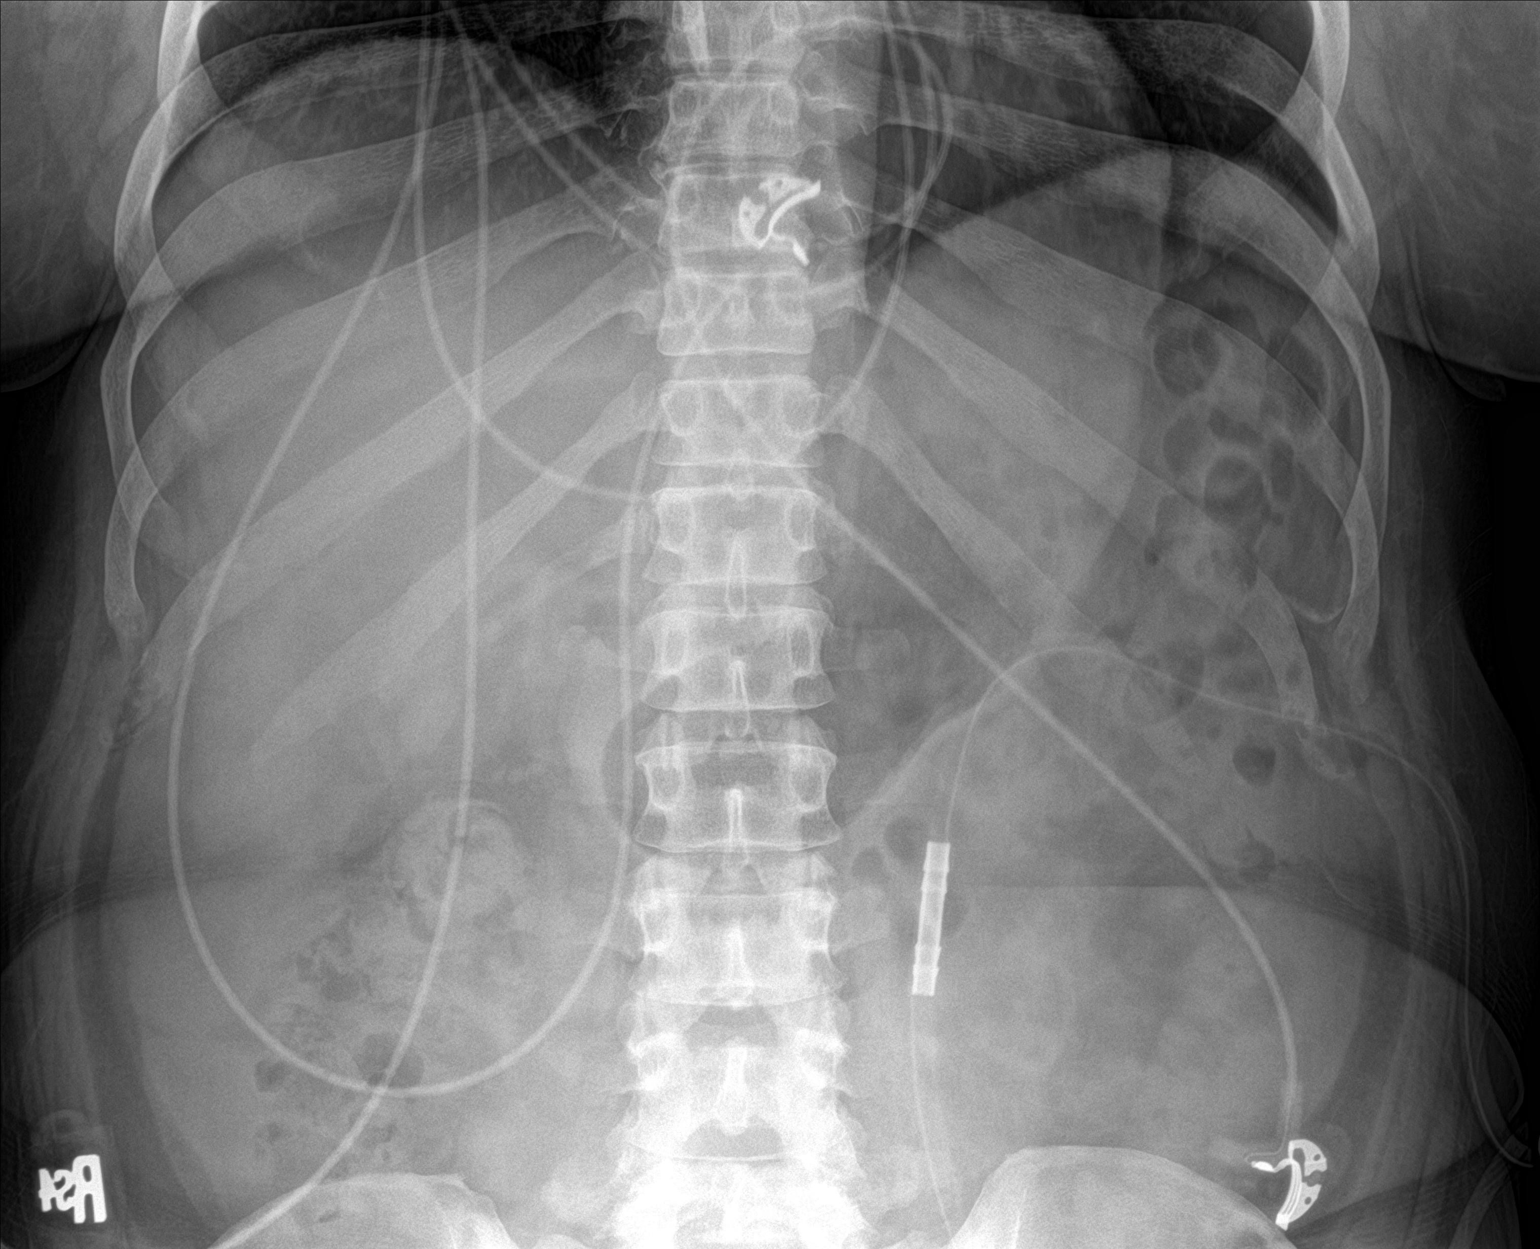

[abdomen kub (2 of 2)]
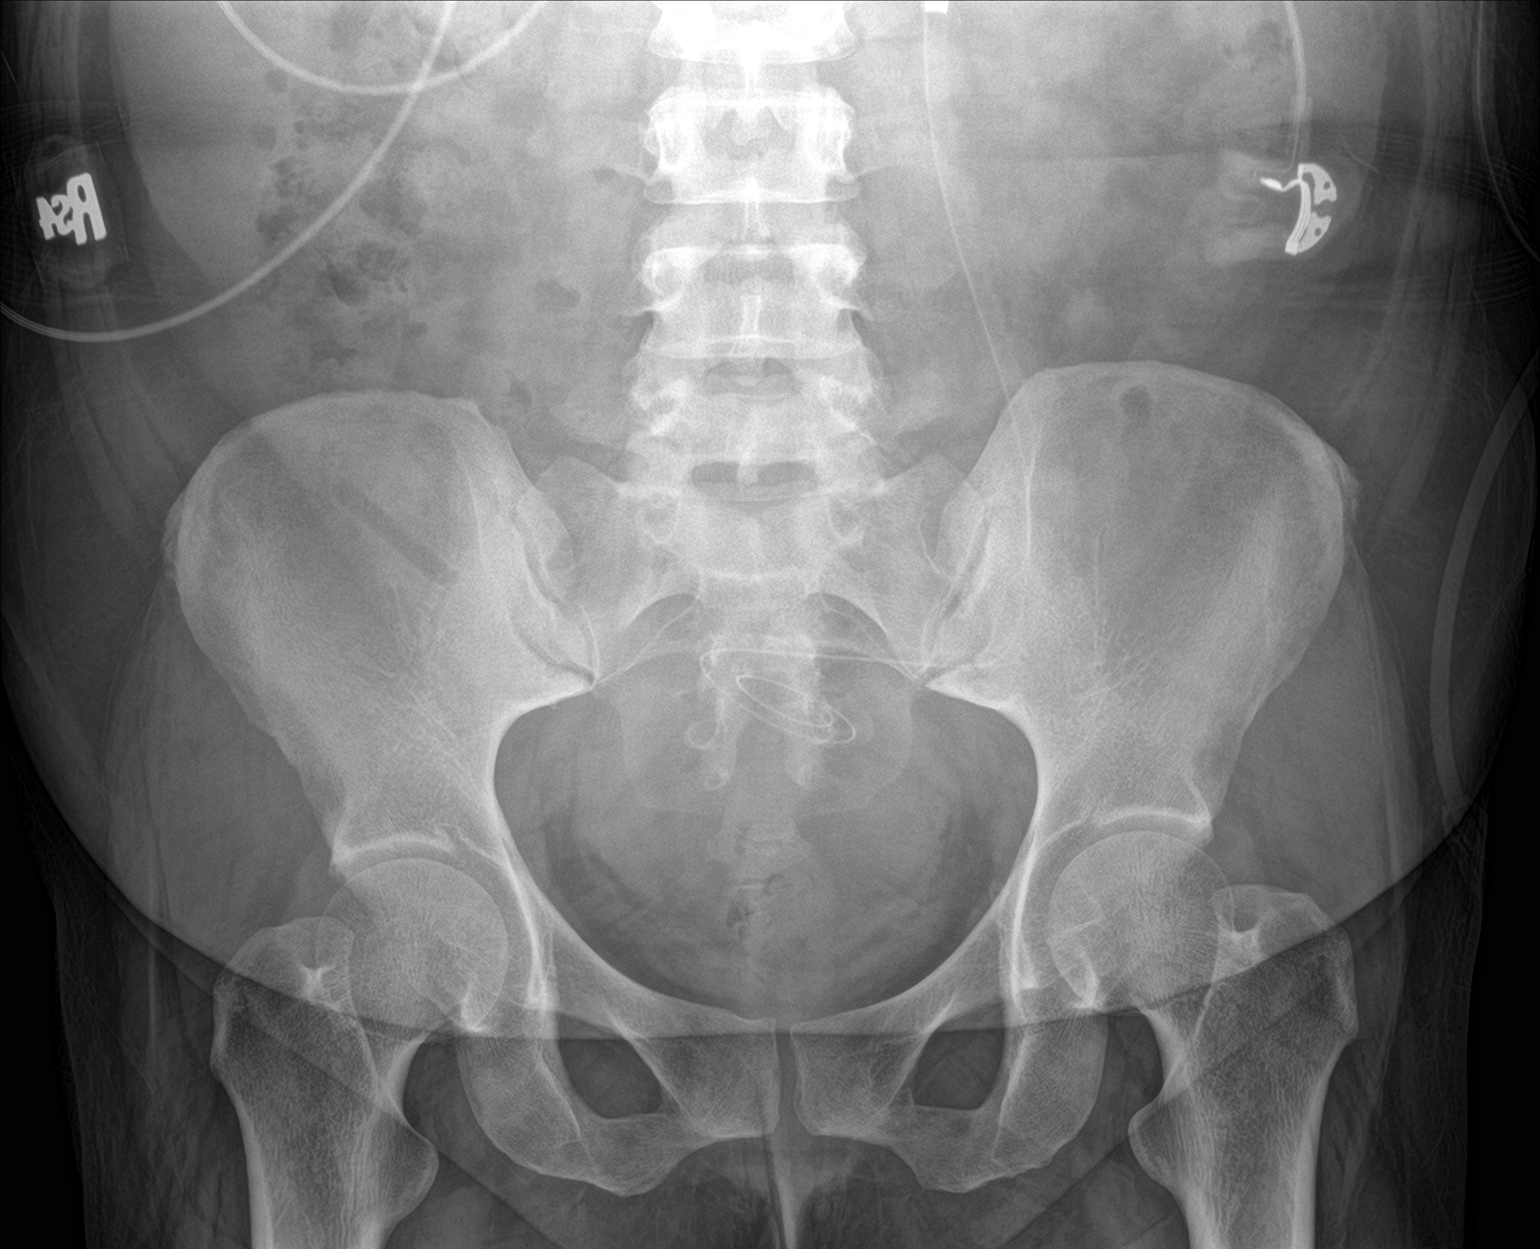

[2 of 2 positions shown; findings below may reference images not displayed]

FINDINGS: Peritoneal dialysis catheter is noted in the left abdomen and coils
in the midline of the pelvis. Nonobstructive bowel gas pattern. No
free air organomegaly. Visualized lung bases clear.
IMPRESSION: Peritoneal dialysis catheter coils in the midline of the pelvis.

No acute findings.

## 2018-03-09 IMAGING — CT CT HEAD W/O CM
3 series · 14 of 47 positions shown, 16 images · non-contrast
Comparison: 07/27/2016 head CT and MRI

CLINICAL DATA: Dizziness.  Transient right arm weakness.

EXAM:
CT HEAD WITHOUT CONTRAST
TECHNIQUE: Contiguous axial images were obtained from the base of the skull
through the vertex without intravenous contrast.

[Series 3: head 5.0 h30s · axial · 0.40mm/px · z∈[-128,-3]mm · 8 of 30 slices shown, 10 images]
[im 3/30  brain]
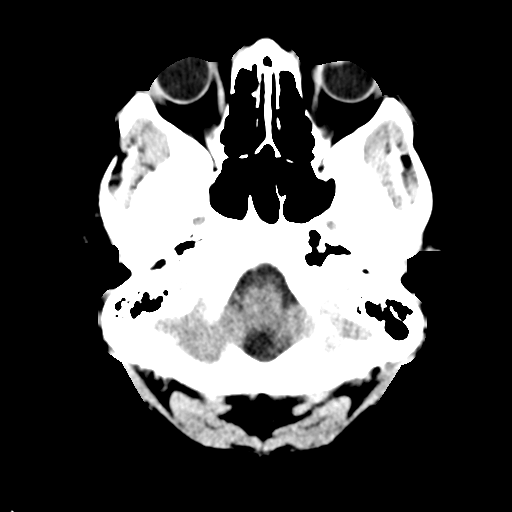
[im 3/30  bone]
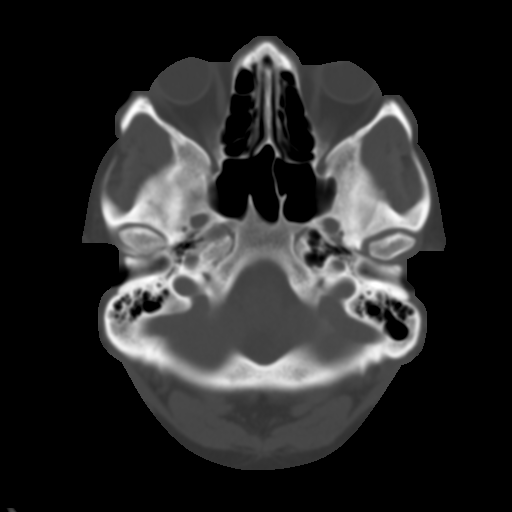
[im 7/30  brain]
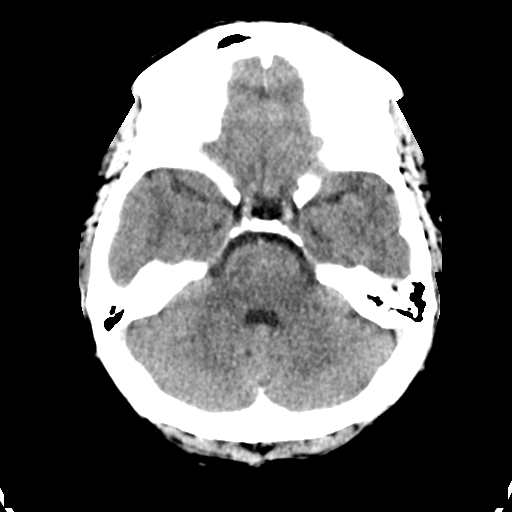
[im 10/30  brain]
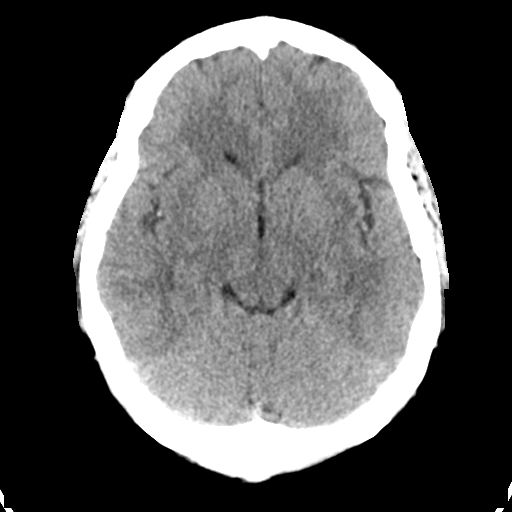
[im 14/30  brain]
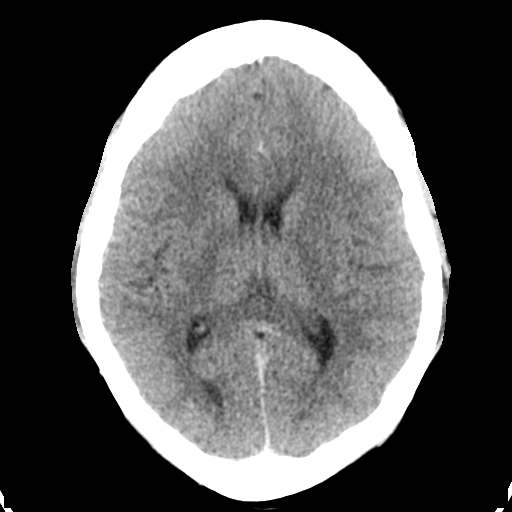
[im 17/30  brain]
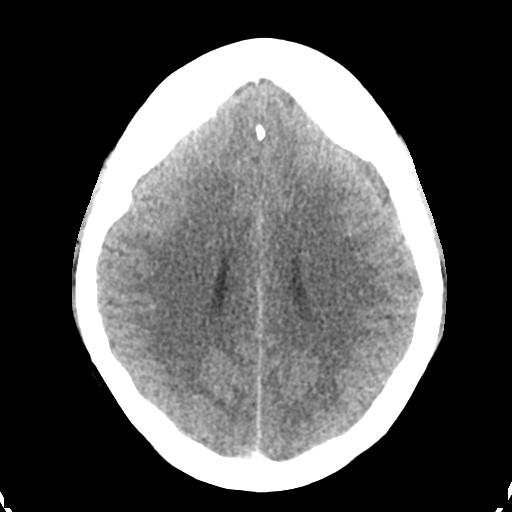
[im 17/30  bone]
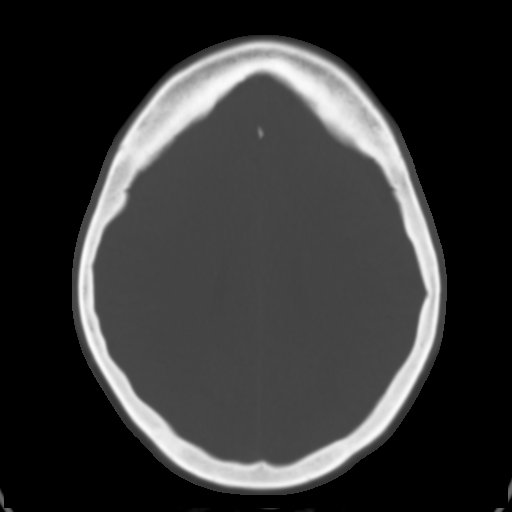
[im 21/30  brain]
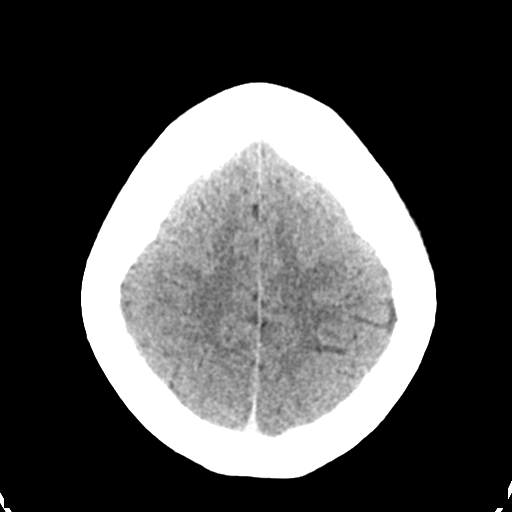
[im 24/30  brain]
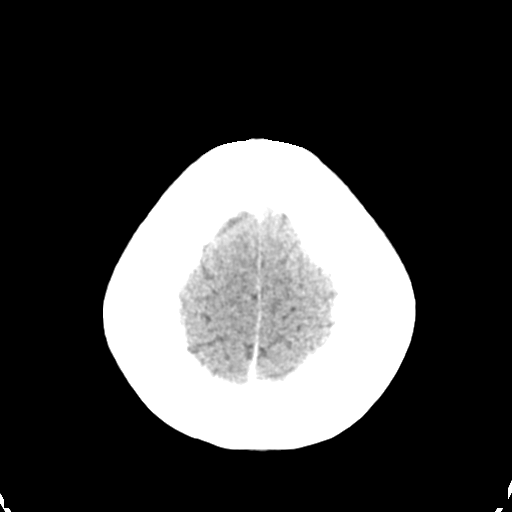
[im 28/30  brain]
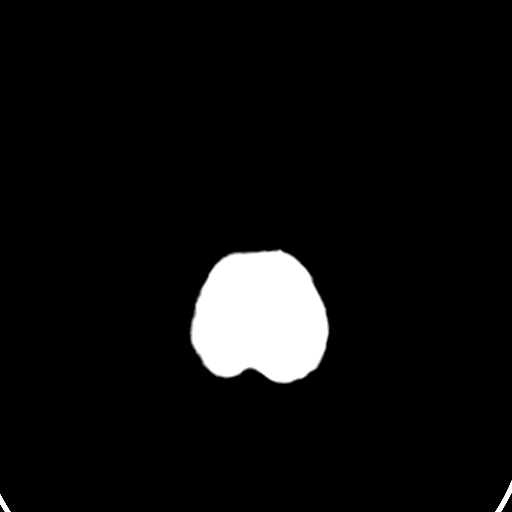

[Series 5: head 3.0 mpr cor · coronal · 0.30mm/px · 3 of 69 slices shown]
[im 23/69  brain]
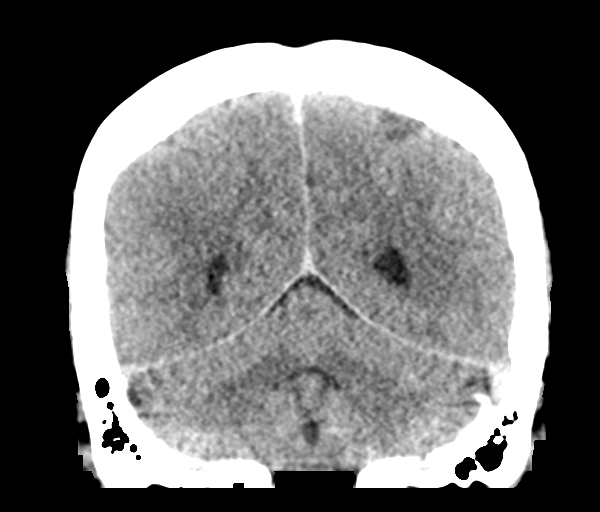
[im 31/69  brain]
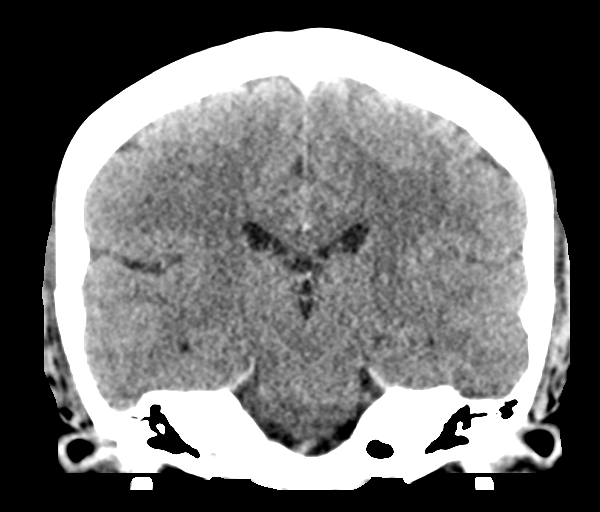
[im 38/69  brain]
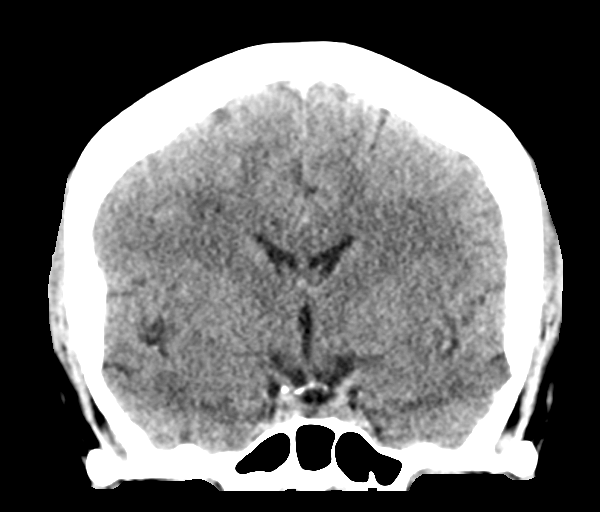

[Series 6: head 3.0 mpr sag · sagittal · 0.29mm/px · 3 of 65 slices shown]
[im 22/65  brain]
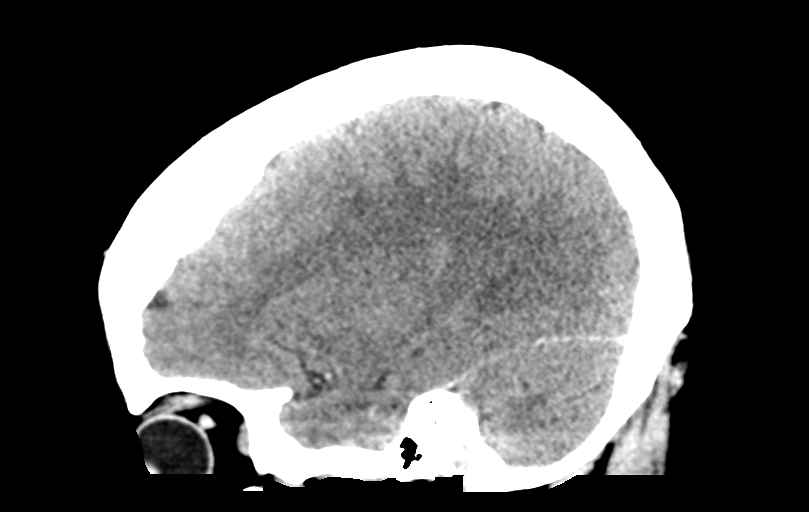
[im 33/65  brain]
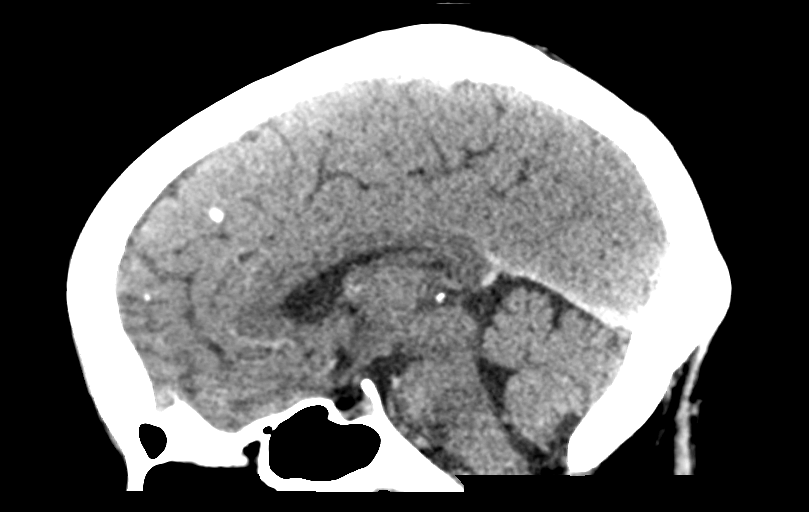
[im 43/65  brain]
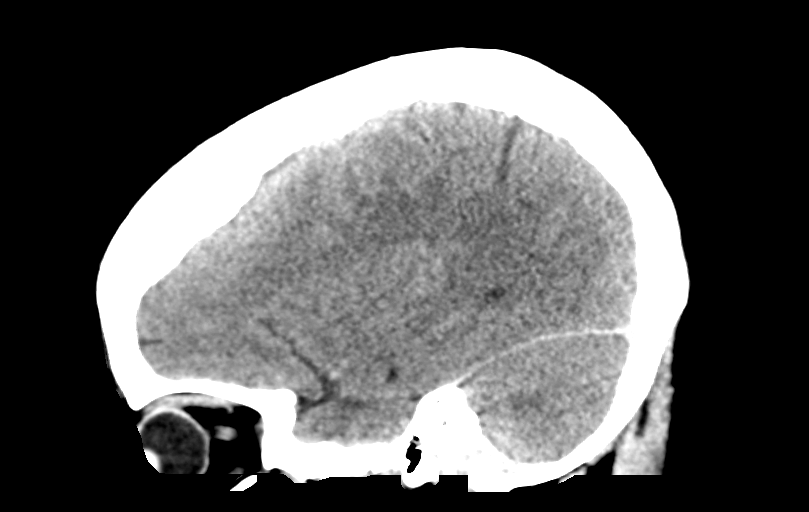

[14 of 47 positions shown; findings below may reference images not displayed]

FINDINGS: Brain: There is a new small region of cortical and subcortical
hypoattenuation in the left parietal lobe corresponding to the
infarct demonstrated on prior MRI, now all more late subacute to
chronic in appearance. There is a small, more subtle focus of low
attenuation in the posterior right frontal lobe also corresponding
to the more acute appearing infarct on the prior MRI. No definite
new infarct is identified. There is no evidence of acute
intracranial hemorrhage, mass, midline shift, or extra-axial fluid
collection. The ventricles are normal.

Vascular: Mild calcified atherosclerosis at the skullbase. Slightly
dense appearance of the ICA termini, MCAs, an ACAs bilaterally
without significant asymmetry.

Skull: No fracture or focal osseous lesion.

Sinuses/Orbits: Visualized paranasal sinuses and mastoid air cells
are clear. Orbits are unremarkable.

Other: None.
IMPRESSION: 1. No evidence of acute intracranial abnormality.
2. Subacute right frontal and left parietal infarcts as seen on last
month's MRI.
